# Patient Record
Sex: Female | Born: 1940 | Race: Black or African American | Hispanic: No | Smoking: Former smoker
Health system: Southern US, Community
[De-identification: ages and names within clinical notes are randomized; demographics above are authoritative.]

## PROBLEM LIST (undated history)

## (undated) DIAGNOSIS — E669 Obesity, unspecified: Secondary | ICD-10-CM

## (undated) DIAGNOSIS — C801 Malignant (primary) neoplasm, unspecified: Secondary | ICD-10-CM

## (undated) DIAGNOSIS — T4145XA Adverse effect of unspecified anesthetic, initial encounter: Secondary | ICD-10-CM

## (undated) DIAGNOSIS — Z801 Family history of malignant neoplasm of trachea, bronchus and lung: Secondary | ICD-10-CM

## (undated) DIAGNOSIS — R7303 Prediabetes: Secondary | ICD-10-CM

## (undated) DIAGNOSIS — Z8042 Family history of malignant neoplasm of prostate: Secondary | ICD-10-CM

## (undated) DIAGNOSIS — I10 Essential (primary) hypertension: Secondary | ICD-10-CM

## (undated) DIAGNOSIS — M1990 Unspecified osteoarthritis, unspecified site: Secondary | ICD-10-CM

## (undated) DIAGNOSIS — N189 Chronic kidney disease, unspecified: Secondary | ICD-10-CM

## (undated) DIAGNOSIS — E11319 Type 2 diabetes mellitus with unspecified diabetic retinopathy without macular edema: Secondary | ICD-10-CM

## (undated) DIAGNOSIS — E785 Hyperlipidemia, unspecified: Secondary | ICD-10-CM

## (undated) DIAGNOSIS — K5792 Diverticulitis of intestine, part unspecified, without perforation or abscess without bleeding: Secondary | ICD-10-CM

## (undated) DIAGNOSIS — Z803 Family history of malignant neoplasm of breast: Secondary | ICD-10-CM

## (undated) DIAGNOSIS — T8859XA Other complications of anesthesia, initial encounter: Secondary | ICD-10-CM

## (undated) DIAGNOSIS — T7840XA Allergy, unspecified, initial encounter: Secondary | ICD-10-CM

## (undated) DIAGNOSIS — G459 Transient cerebral ischemic attack, unspecified: Secondary | ICD-10-CM

## (undated) DIAGNOSIS — K635 Polyp of colon: Secondary | ICD-10-CM

## (undated) DIAGNOSIS — R39198 Other difficulties with micturition: Secondary | ICD-10-CM

## (undated) HISTORY — DX: Unspecified osteoarthritis, unspecified site: M19.90

## (undated) HISTORY — DX: Polyp of colon: K63.5

## (undated) HISTORY — DX: Diverticulitis of intestine, part unspecified, without perforation or abscess without bleeding: K57.92

## (undated) HISTORY — DX: Family history of malignant neoplasm of breast: Z80.3

## (undated) HISTORY — DX: Malignant (primary) neoplasm, unspecified: C80.1

## (undated) HISTORY — DX: Hyperlipidemia, unspecified: E78.5

## (undated) HISTORY — DX: Allergy, unspecified, initial encounter: T78.40XA

## (undated) HISTORY — DX: Obesity, unspecified: E66.9

## (undated) HISTORY — DX: Family history of malignant neoplasm of prostate: Z80.42

## (undated) HISTORY — PX: COLONOSCOPY: SHX5424

## (undated) HISTORY — DX: Essential (primary) hypertension: I10

## (undated) HISTORY — DX: Family history of malignant neoplasm of trachea, bronchus and lung: Z80.1

## (undated) HISTORY — PX: BREAST EXCISIONAL BIOPSY: SUR124

## (undated) HISTORY — PX: BREAST SURGERY: SHX581

---

## 1998-01-22 ENCOUNTER — Ambulatory Visit (HOSPITAL_COMMUNITY): Admission: RE | Admit: 1998-01-22 | Discharge: 1998-01-22 | Payer: Self-pay | Admitting: Family Medicine

## 1999-02-18 ENCOUNTER — Ambulatory Visit (HOSPITAL_COMMUNITY): Admission: RE | Admit: 1999-02-18 | Discharge: 1999-02-18 | Payer: Self-pay | Admitting: Family Medicine

## 2000-02-24 ENCOUNTER — Ambulatory Visit (HOSPITAL_COMMUNITY): Admission: RE | Admit: 2000-02-24 | Discharge: 2000-02-24 | Payer: Self-pay

## 2000-11-16 ENCOUNTER — Encounter: Payer: Self-pay | Admitting: Family Medicine

## 2000-11-16 ENCOUNTER — Ambulatory Visit (HOSPITAL_COMMUNITY): Admission: RE | Admit: 2000-11-16 | Discharge: 2000-11-16 | Payer: Self-pay | Admitting: Family Medicine

## 2001-02-28 ENCOUNTER — Ambulatory Visit (HOSPITAL_COMMUNITY): Admission: RE | Admit: 2001-02-28 | Discharge: 2001-02-28 | Payer: Self-pay | Admitting: Family Medicine

## 2002-03-23 ENCOUNTER — Encounter: Payer: Self-pay | Admitting: Family Medicine

## 2002-03-23 ENCOUNTER — Encounter: Admission: RE | Admit: 2002-03-23 | Discharge: 2002-03-23 | Payer: Self-pay | Admitting: Family Medicine

## 2003-11-05 ENCOUNTER — Encounter: Admission: RE | Admit: 2003-11-05 | Discharge: 2003-11-05 | Payer: Self-pay | Admitting: Family Medicine

## 2003-11-12 ENCOUNTER — Encounter: Admission: RE | Admit: 2003-11-12 | Discharge: 2003-11-12 | Payer: Self-pay | Admitting: Family Medicine

## 2004-10-30 ENCOUNTER — Ambulatory Visit (HOSPITAL_COMMUNITY): Admission: RE | Admit: 2004-10-30 | Discharge: 2004-10-30 | Payer: Self-pay | Admitting: Family Medicine

## 2004-10-30 ENCOUNTER — Ambulatory Visit: Payer: Self-pay | Admitting: *Deleted

## 2004-10-30 ENCOUNTER — Ambulatory Visit: Payer: Self-pay | Admitting: Family Medicine

## 2004-11-26 ENCOUNTER — Encounter: Admission: RE | Admit: 2004-11-26 | Discharge: 2004-11-26 | Payer: Self-pay | Admitting: Family Medicine

## 2004-12-22 ENCOUNTER — Ambulatory Visit: Payer: Self-pay | Admitting: Family Medicine

## 2005-01-28 ENCOUNTER — Ambulatory Visit: Payer: Self-pay | Admitting: Nurse Practitioner

## 2005-04-29 ENCOUNTER — Ambulatory Visit: Payer: Self-pay | Admitting: Family Medicine

## 2005-08-26 ENCOUNTER — Ambulatory Visit: Payer: Self-pay | Admitting: Nurse Practitioner

## 2005-12-08 ENCOUNTER — Encounter: Admission: RE | Admit: 2005-12-08 | Discharge: 2005-12-08 | Payer: Self-pay | Admitting: Family Medicine

## 2005-12-21 ENCOUNTER — Encounter (INDEPENDENT_AMBULATORY_CARE_PROVIDER_SITE_OTHER): Payer: Self-pay | Admitting: *Deleted

## 2005-12-21 ENCOUNTER — Encounter: Admission: RE | Admit: 2005-12-21 | Discharge: 2005-12-21 | Payer: Self-pay | Admitting: Family Medicine

## 2006-02-18 ENCOUNTER — Encounter: Admission: RE | Admit: 2006-02-18 | Discharge: 2006-02-18 | Payer: Self-pay | Admitting: General Surgery

## 2006-02-18 ENCOUNTER — Ambulatory Visit (HOSPITAL_BASED_OUTPATIENT_CLINIC_OR_DEPARTMENT_OTHER): Admission: RE | Admit: 2006-02-18 | Discharge: 2006-02-18 | Payer: Self-pay | Admitting: General Surgery

## 2006-02-18 ENCOUNTER — Encounter (INDEPENDENT_AMBULATORY_CARE_PROVIDER_SITE_OTHER): Payer: Self-pay | Admitting: *Deleted

## 2006-03-04 ENCOUNTER — Ambulatory Visit: Payer: Self-pay | Admitting: Family Medicine

## 2006-03-15 ENCOUNTER — Ambulatory Visit: Payer: Self-pay | Admitting: Family Medicine

## 2006-03-16 ENCOUNTER — Ambulatory Visit (HOSPITAL_COMMUNITY): Admission: RE | Admit: 2006-03-16 | Discharge: 2006-03-16 | Payer: Self-pay | Admitting: Family Medicine

## 2006-03-18 ENCOUNTER — Ambulatory Visit: Payer: Self-pay | Admitting: Family Medicine

## 2006-03-19 ENCOUNTER — Ambulatory Visit: Admission: RE | Admit: 2006-03-19 | Discharge: 2006-03-19 | Payer: Self-pay | Admitting: Family Medicine

## 2006-05-11 ENCOUNTER — Ambulatory Visit: Payer: Self-pay | Admitting: Family Medicine

## 2006-06-07 ENCOUNTER — Ambulatory Visit: Payer: Self-pay | Admitting: Family Medicine

## 2006-09-07 ENCOUNTER — Ambulatory Visit: Payer: Self-pay | Admitting: Family Medicine

## 2006-09-08 ENCOUNTER — Ambulatory Visit: Payer: Self-pay | Admitting: Family Medicine

## 2006-12-14 ENCOUNTER — Ambulatory Visit (HOSPITAL_COMMUNITY): Admission: RE | Admit: 2006-12-14 | Discharge: 2006-12-14 | Payer: Self-pay | Admitting: Obstetrics and Gynecology

## 2007-06-21 DIAGNOSIS — R32 Unspecified urinary incontinence: Secondary | ICD-10-CM

## 2007-06-21 DIAGNOSIS — J301 Allergic rhinitis due to pollen: Secondary | ICD-10-CM | POA: Insufficient documentation

## 2007-06-21 DIAGNOSIS — I10 Essential (primary) hypertension: Secondary | ICD-10-CM | POA: Insufficient documentation

## 2007-06-21 DIAGNOSIS — Z9189 Other specified personal risk factors, not elsewhere classified: Secondary | ICD-10-CM | POA: Insufficient documentation

## 2007-06-21 DIAGNOSIS — E7800 Pure hypercholesterolemia, unspecified: Secondary | ICD-10-CM | POA: Insufficient documentation

## 2007-06-21 DIAGNOSIS — N951 Menopausal and female climacteric states: Secondary | ICD-10-CM | POA: Insufficient documentation

## 2007-06-21 DIAGNOSIS — K5730 Diverticulosis of large intestine without perforation or abscess without bleeding: Secondary | ICD-10-CM | POA: Insufficient documentation

## 2007-06-21 DIAGNOSIS — Z8719 Personal history of other diseases of the digestive system: Secondary | ICD-10-CM | POA: Insufficient documentation

## 2007-07-20 ENCOUNTER — Encounter (INDEPENDENT_AMBULATORY_CARE_PROVIDER_SITE_OTHER): Payer: Self-pay | Admitting: *Deleted

## 2007-12-21 ENCOUNTER — Encounter: Admission: RE | Admit: 2007-12-21 | Discharge: 2007-12-21 | Payer: Self-pay | Admitting: Cardiology

## 2008-09-19 ENCOUNTER — Encounter: Admission: RE | Admit: 2008-09-19 | Discharge: 2008-09-19 | Payer: Self-pay | Admitting: Cardiology

## 2008-12-26 ENCOUNTER — Encounter: Admission: RE | Admit: 2008-12-26 | Discharge: 2008-12-26 | Payer: Self-pay | Admitting: Cardiology

## 2009-10-03 ENCOUNTER — Encounter: Admission: RE | Admit: 2009-10-03 | Discharge: 2009-10-03 | Payer: Self-pay | Admitting: Cardiology

## 2009-12-18 ENCOUNTER — Encounter: Admission: RE | Admit: 2009-12-18 | Discharge: 2009-12-18 | Payer: Self-pay | Admitting: Cardiology

## 2009-12-27 ENCOUNTER — Encounter: Admission: RE | Admit: 2009-12-27 | Discharge: 2009-12-27 | Payer: Self-pay | Admitting: Cardiology

## 2010-06-26 ENCOUNTER — Ambulatory Visit (HOSPITAL_BASED_OUTPATIENT_CLINIC_OR_DEPARTMENT_OTHER): Admission: RE | Admit: 2010-06-26 | Discharge: 2010-06-26 | Payer: Self-pay | Admitting: Urology

## 2010-11-22 ENCOUNTER — Encounter: Payer: Self-pay | Admitting: Family Medicine

## 2010-11-23 ENCOUNTER — Encounter: Payer: Self-pay | Admitting: Family Medicine

## 2010-12-30 ENCOUNTER — Other Ambulatory Visit: Payer: Self-pay | Admitting: Obstetrics and Gynecology

## 2010-12-30 DIAGNOSIS — Z1231 Encounter for screening mammogram for malignant neoplasm of breast: Secondary | ICD-10-CM

## 2011-01-13 ENCOUNTER — Ambulatory Visit
Admission: RE | Admit: 2011-01-13 | Discharge: 2011-01-13 | Disposition: A | Discharge status: Home / Self Care | Payer: Medicare Other | Source: Ambulatory Visit | Attending: Obstetrics and Gynecology | Admitting: Obstetrics and Gynecology

## 2011-01-13 DIAGNOSIS — Z1231 Encounter for screening mammogram for malignant neoplasm of breast: Secondary | ICD-10-CM

## 2011-01-16 LAB — BASIC METABOLIC PANEL
BUN: 20 mg/dL (ref 6–23)
CO2: 26 mEq/L (ref 19–32)
Calcium: 9.2 mg/dL (ref 8.4–10.5)
Chloride: 106 mEq/L (ref 96–112)
Creatinine, Ser: 1.04 mg/dL (ref 0.4–1.2)
GFR calc Af Amer: >60 mL/min (ref >60)
GFR calc non Af Amer: 53 mL/min — ABNORMAL LOW (ref >60)
Glucose, Bld: 130 mg/dL — ABNORMAL HIGH (ref 70–99)
Potassium: 4 mEq/L (ref 3.5–5.1)
Sodium: 138 mEq/L (ref 135–145)

## 2011-01-16 LAB — POCT I-STAT, CHEM 8
BUN: 20 mg/dL (ref 6–23)
Calcium, Ion: 1.24 mmol/L (ref 1.12–1.32)
Creatinine, Ser: 1 mg/dL (ref 0.4–1.2)
Glucose, Bld: 128 mg/dL — ABNORMAL HIGH (ref 70–99)
Hemoglobin: 13.3 g/dL (ref 12.0–15.0)
TCO2: 26 mmol/L (ref 0–100)

## 2011-01-16 LAB — CBC
MCV: 84.9 fL (ref 78.0–100.0)
Platelets: 181 10*3/uL (ref 150–400)
RBC: 4.34 MIL/uL (ref 3.87–5.11)
RDW: 13.2 % (ref 11.5–15.5)
WBC: 4.8 10*3/uL (ref 4.0–10.5)

## 2011-01-16 LAB — PROTIME-INR
INR: 1.05 (ref 0.00–1.49)
Prothrombin Time: 13.9 seconds (ref 11.6–15.2)

## 2011-01-16 LAB — APTT: aPTT: 37 seconds (ref 24–37)

## 2011-01-16 LAB — ABO/RH: ABO/RH(D): O POS

## 2011-01-16 LAB — TYPE AND SCREEN: ABO/RH(D): O POS

## 2011-03-20 NOTE — Op Note (Signed)
NAMEELFA, WOOTON                ACCOUNT NO.:  [id number]   MEDICAL RECORD NO.:  [id number]          PATIENT TYPE:  AMB   LOCATION:  DSC                          FACILITY:  [facility]   PHYSICIAN:  Leonie Man, M.D.   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   PROCEDURE:  Lumpectomy, right breast.   SURGEON:  Leonie Man, M.D.   ASSISTANT:  OR R.N.   ANESTHESIA:  General.   SPECIMENS TO PATHOLOGY:  Breast tissue.   ESTIMATED BLOOD LOSS:  Minimal.   COMPLICATIONS:  None.   DISPOSITION:  Patient to the PACU in excellent condition.   NOTE:  Ms. Carolyn Weber is a 70 year old female presenting with an abnormal  mammogram.  She underwent ultrasound-guided core biopsy which showed a  sclerosing papilloma.  The patient comes to the operating room now for  excision of this area after needle localization has been done to localize  the area of abnormality.   PROCEDURE:  The patient was positioned supinely.  After induction of general  anesthesia, the right breast was prepped and draped to be included in a  sterile operative field.  A circumareolar incision was carried down over the  superior border of the areola and deepened through skin and subcutaneous  tissue, raising a flap up to the region of the wire, which was located in  approximately the 12 o'clock axis.  Wide dissection was carried down around  the wire and this was carried down all the way to the chest wall and the  entire specimen removed and forwarded for pathologic evaluation.  Specimen  mammography showed the clip and wire to be well within the specimen.  Hemostasis was obtained with electrocautery and breast tissues  reapproximated with interrupted 2-0  Vicryl sutures, subcutaneous tissues  closed with 3-0 Vicryl sutures after sponge and instrument counts were  verified and skin was  closed with a 5-0 Monocryl suture and then reinforced with Steri-Strips.  Sterile dressings were applied, anesthetic reversed and patient removed from  the operating room to the recovery room in stable condition.  She tolerated  the procedure well.      Leonie Man, M.D.  Electronically Signed     PB/MEDQ  D:  02/18/2006  T:  02/19/2006  Job:  [id number]

## 2011-11-11 ENCOUNTER — Ambulatory Visit
Admission: RE | Admit: 2011-11-11 | Discharge: 2011-11-11 | Disposition: A | Discharge status: Home / Self Care | Payer: Medicare Other | Source: Ambulatory Visit | Attending: Cardiology | Admitting: Cardiology

## 2011-11-11 ENCOUNTER — Other Ambulatory Visit: Payer: Self-pay | Admitting: Cardiology

## 2011-11-11 DIAGNOSIS — R32 Unspecified urinary incontinence: Secondary | ICD-10-CM

## 2011-11-11 DIAGNOSIS — M549 Dorsalgia, unspecified: Secondary | ICD-10-CM

## 2011-12-21 ENCOUNTER — Encounter: Payer: Self-pay | Admitting: Nurse Practitioner

## 2011-12-28 ENCOUNTER — Encounter: Payer: Self-pay | Admitting: Nurse Practitioner

## 2011-12-28 ENCOUNTER — Ambulatory Visit (INDEPENDENT_AMBULATORY_CARE_PROVIDER_SITE_OTHER): Payer: Medicare Other | Admitting: Nurse Practitioner

## 2011-12-28 VITALS — BP 122/68 | HR 80 | Ht 67.0 in | Wt 208.6 lb

## 2011-12-28 DIAGNOSIS — R159 Full incontinence of feces: Secondary | ICD-10-CM

## 2011-12-28 NOTE — Progress Notes (Signed)
12/28/2011 Carolyn Weber [id number] 1940-12-07   HISTORY OF PRESENT ILLNESS: Carolyn Weber is a 71 year old female, new to this practice, here for evaluation of fecal incontinence. She had a colonoscopy with polypectomy approximately 3 years ago with [facility]. She was apparently told to have repeat colonoscopy in 5 years.  Patient wanted to talk with a female about her fecal incontinence, hence her reason for coming today. Patient has intentionally lost 25 pounds which she attributes to dietary changes. Patient gives a long history of fecal incontinence but since eating more fruits and vegetables her stools have become softer and the fecal incontinence is much worse.   Patient feels she adequately evacuates her bowels at time of defecation.    Patient followed by [facility] for overactive bladder. She is currently being treated with what sounds like tibial nerve stimulation .                                                                                                                                            Past Medical History  Diagnosis Date  . Chronic headaches   . Colon polyps   . Diverticulitis   . Hyperlipemia   . Hypertension   . Obesity   . UTI (lower urinary tract infection)    Past Surgical History  Procedure Date  . Calcium removal from breast   . Urinary sling     reports that she quit smoking about 33 years ago. She has never used smokeless tobacco. She reports that she does not drink alcohol or use illicit drugs. family history includes Breast cancer in her maternal aunt and mother; Colon polyps in her brother; Diabetes in her father and mother; Heart disease in her father and mother; and Prostate cancer in her father. No Known Allergies    Outpatient Encounter Prescriptions as of 12/28/2011  Medication Sig Dispense Refill  . aspirin 81 MG tablet Take 81 mg by mouth daily.      Marland Kitchen b complex vitamins tablet Take 1 tablet by mouth daily.      . calcium carbonate  (OS-CAL) 600 MG TABS Take 600 mg by mouth 2 (two) times daily with a meal.      . celecoxib (CELEBREX) 200 MG capsule Take 200 mg by mouth daily.      . cholecalciferol (VITAMIN D) 1000 UNITS tablet Take 1,000 Units by mouth daily.      . clobetasol cream (TEMOVATE) 0.05 % Apply 1 application topically daily as needed.      . DiphenhydrAMINE HCl (BENADRYL ALLERGY PO) Take 1 tablet by mouth. 1 tablet one to two times a day as needed      . fish oil-omega-3 fatty acids 1000 MG capsule Take 2 g by mouth daily.      Marland Kitchen gabapentin (NEURONTIN) 600 MG tablet Take 600 mg by mouth. Take one half a tablet Monday,  Wednesday, and Friday      . glucosamine-chondroitin 500-400 MG tablet Take 1 tablet by mouth 3 (three) times daily.      Marland Kitchen imipramine (TOFRANIL) 50 MG tablet Take 50 mg by mouth at bedtime.      Marland Kitchen loratadine (CLARITIN) 10 MG tablet Take 10 mg by mouth daily.      . Multiple Vitamin (MULTIVITAMIN) tablet Take 1 tablet by mouth daily.      Marland Kitchen olmesartan (BENICAR) 40 MG tablet Take 40 mg by mouth daily.      . simvastatin (ZOCOR) 10 MG tablet Take 20 mg by mouth at bedtime.        REVIEW OF SYSTEMS  : Positive for back pain, headaches, leg cramps. All other systems reviewed and negative except where noted in the History of Present Illness.   PHYSICAL EXAM: BP 122/68  Pulse 80  Ht 5\' 7"  (1.702 m)  Wt 208 lb 9.6 oz (94.62 kg)  BMI 32.67 kg/m2 General: Well developed black female in no acute distress Head: Normocephalic and atraumatic Eyes:  sclerae anicteric,conjunctive pink. Ears: Normal auditory acuity Neck: Supple, no masses.  Lungs: Clear throughout to auscultation Heart: Regular rate and rhythm Abdomen: Soft, non distended, nontender. No masses or hepatomegaly noted. Normal Bowel sounds Rectal: No external lesions seen. Sphincter tone adequate Musculoskeletal: Symmetrical with no gross deformities  Skin: No lesions on visible extremities Extremities: No edema or deformities  noted Neurological: Alert oriented, grossly nonfocal Cervical Nodes:  No significant cervical adenopathy Psychological:  Alert and cooperative. Normal mood and affect  ASSESSMENT AND PLAN; 33.  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 Spoke at length with the patient regarding causes and treatment of fecal incontinence. Her stools were not loose and sphincter tone seems adequate. Trial of fiber supplement may or may not be of benefit the patient would like to give it a try. We discussed anal manometry / biofeedback offered some tertiary care centers. We also discussed Delton Coombes which is an outpatient procedure (injection) for fecal incontinence. Patient will research Delton Coombes to see if it is of interest to her. Dr. Arlyce Dice in our office will begin to do the injections sometime in May. I do not know if the patient will qualify from an insurance standpoint but will ask Dr. Marzetta Board nurse to check into that. I gave patient NIDDK literature on fecal incontinence.  Patient is currently undergoing what sounds like tibial nerve stimulation for overactive bladder, there is some literature regarding its use in fecal incontinence as well.   2. History of colon polyps. Will request colonoscopy report from [facility] and make recommendations about timing of surveillance colonoscopy.

## 2011-12-28 NOTE — Patient Instructions (Signed)
Carolyn Weber, Dr. Marzetta Board nurse will call you about an appointment with Dr. Arlyce Dice for treatment.

## 2011-12-29 ENCOUNTER — Encounter: Payer: Self-pay | Admitting: Nurse Practitioner

## 2011-12-30 NOTE — Progress Notes (Signed)
Reviewed and agree with management. Earma Nicolaou D. Courney Garrod, M.D., FACG  

## 2012-01-05 ENCOUNTER — Other Ambulatory Visit: Payer: Self-pay | Admitting: Cardiology

## 2012-01-05 DIAGNOSIS — Z1231 Encounter for screening mammogram for malignant neoplasm of breast: Secondary | ICD-10-CM

## 2012-01-21 ENCOUNTER — Ambulatory Visit: Payer: Medicare Other

## 2012-03-16 ENCOUNTER — Ambulatory Visit
Admission: RE | Admit: 2012-03-16 | Discharge: 2012-03-16 | Disposition: A | Discharge status: Home / Self Care | Payer: Medicare Other | Source: Ambulatory Visit | Attending: Cardiology | Admitting: Cardiology

## 2012-03-16 DIAGNOSIS — Z1231 Encounter for screening mammogram for malignant neoplasm of breast: Secondary | ICD-10-CM

## 2012-11-23 ENCOUNTER — Other Ambulatory Visit: Payer: Self-pay | Admitting: Gastroenterology

## 2012-12-07 ENCOUNTER — Other Ambulatory Visit: Payer: Self-pay | Admitting: Cardiology

## 2012-12-07 ENCOUNTER — Ambulatory Visit
Admission: RE | Admit: 2012-12-07 | Discharge: 2012-12-07 | Disposition: A | Discharge status: Home / Self Care | Payer: Medicare Other | Source: Ambulatory Visit | Attending: Cardiology | Admitting: Cardiology

## 2012-12-07 DIAGNOSIS — T1490XA Injury, unspecified, initial encounter: Secondary | ICD-10-CM

## 2012-12-07 DIAGNOSIS — R609 Edema, unspecified: Secondary | ICD-10-CM

## 2013-03-07 ENCOUNTER — Other Ambulatory Visit: Payer: Self-pay

## 2013-03-07 DIAGNOSIS — Z1231 Encounter for screening mammogram for malignant neoplasm of breast: Secondary | ICD-10-CM

## 2013-04-12 ENCOUNTER — Ambulatory Visit: Payer: Medicare Other

## 2013-05-23 ENCOUNTER — Ambulatory Visit
Admission: RE | Admit: 2013-05-23 | Discharge: 2013-05-23 | Disposition: A | Discharge status: Home / Self Care | Payer: Medicare Other | Source: Ambulatory Visit

## 2013-05-23 DIAGNOSIS — Z1231 Encounter for screening mammogram for malignant neoplasm of breast: Secondary | ICD-10-CM

## 2014-05-28 ENCOUNTER — Other Ambulatory Visit: Payer: Self-pay

## 2014-05-28 DIAGNOSIS — Z1231 Encounter for screening mammogram for malignant neoplasm of breast: Secondary | ICD-10-CM

## 2014-05-30 ENCOUNTER — Encounter (INDEPENDENT_AMBULATORY_CARE_PROVIDER_SITE_OTHER): Payer: Self-pay

## 2014-05-30 ENCOUNTER — Ambulatory Visit
Admission: RE | Admit: 2014-05-30 | Discharge: 2014-05-30 | Disposition: A | Discharge status: Home / Self Care | Payer: Medicare Other | Source: Ambulatory Visit

## 2014-05-30 DIAGNOSIS — Z1231 Encounter for screening mammogram for malignant neoplasm of breast: Secondary | ICD-10-CM

## 2014-07-09 ENCOUNTER — Encounter (HOSPITAL_BASED_OUTPATIENT_CLINIC_OR_DEPARTMENT_OTHER): Payer: Self-pay | Admitting: Emergency Medicine

## 2014-07-09 ENCOUNTER — Ambulatory Visit (INDEPENDENT_AMBULATORY_CARE_PROVIDER_SITE_OTHER): Payer: Medicare Other | Admitting: Family Medicine

## 2014-07-09 ENCOUNTER — Emergency Department (HOSPITAL_BASED_OUTPATIENT_CLINIC_OR_DEPARTMENT_OTHER)
Admission: EM | Admit: 2014-07-09 | Discharge: 2014-07-10 | Disposition: A | Discharge status: Home / Self Care | Payer: Medicare Other | Attending: Emergency Medicine | Admitting: Emergency Medicine

## 2014-07-09 ENCOUNTER — Ambulatory Visit (INDEPENDENT_AMBULATORY_CARE_PROVIDER_SITE_OTHER): Payer: Medicare Other

## 2014-07-09 VITALS — BP 100/60 | HR 90 | Temp 98.7°F | Resp 18 | Ht 64.5 in | Wt 203.0 lb

## 2014-07-09 DIAGNOSIS — R7309 Other abnormal glucose: Secondary | ICD-10-CM

## 2014-07-09 DIAGNOSIS — R509 Fever, unspecified: Secondary | ICD-10-CM

## 2014-07-09 DIAGNOSIS — I10 Essential (primary) hypertension: Secondary | ICD-10-CM | POA: Diagnosis not present

## 2014-07-09 DIAGNOSIS — N289 Disorder of kidney and ureter, unspecified: Secondary | ICD-10-CM | POA: Diagnosis not present

## 2014-07-09 DIAGNOSIS — R000 Tachycardia, unspecified: Secondary | ICD-10-CM | POA: Diagnosis not present

## 2014-07-09 DIAGNOSIS — Z7982 Long term (current) use of aspirin: Secondary | ICD-10-CM | POA: Insufficient documentation

## 2014-07-09 DIAGNOSIS — R319 Hematuria, unspecified: Secondary | ICD-10-CM

## 2014-07-09 DIAGNOSIS — R799 Abnormal finding of blood chemistry, unspecified: Secondary | ICD-10-CM | POA: Diagnosis not present

## 2014-07-09 DIAGNOSIS — R05 Cough: Secondary | ICD-10-CM

## 2014-07-09 DIAGNOSIS — Z8719 Personal history of other diseases of the digestive system: Secondary | ICD-10-CM | POA: Diagnosis not present

## 2014-07-09 DIAGNOSIS — Z87891 Personal history of nicotine dependence: Secondary | ICD-10-CM | POA: Insufficient documentation

## 2014-07-09 DIAGNOSIS — R739 Hyperglycemia, unspecified: Secondary | ICD-10-CM

## 2014-07-09 DIAGNOSIS — Z79899 Other long term (current) drug therapy: Secondary | ICD-10-CM | POA: Diagnosis not present

## 2014-07-09 DIAGNOSIS — R059 Cough, unspecified: Secondary | ICD-10-CM

## 2014-07-09 DIAGNOSIS — R350 Frequency of micturition: Secondary | ICD-10-CM

## 2014-07-09 DIAGNOSIS — E785 Hyperlipidemia, unspecified: Secondary | ICD-10-CM | POA: Insufficient documentation

## 2014-07-09 DIAGNOSIS — M129 Arthropathy, unspecified: Secondary | ICD-10-CM | POA: Diagnosis not present

## 2014-07-09 DIAGNOSIS — E669 Obesity, unspecified: Secondary | ICD-10-CM | POA: Diagnosis not present

## 2014-07-09 DIAGNOSIS — R5383 Other fatigue: Secondary | ICD-10-CM

## 2014-07-09 DIAGNOSIS — R61 Generalized hyperhidrosis: Secondary | ICD-10-CM | POA: Diagnosis not present

## 2014-07-09 DIAGNOSIS — R0602 Shortness of breath: Secondary | ICD-10-CM | POA: Insufficient documentation

## 2014-07-09 DIAGNOSIS — Z791 Long term (current) use of non-steroidal anti-inflammatories (NSAID): Secondary | ICD-10-CM | POA: Insufficient documentation

## 2014-07-09 DIAGNOSIS — R630 Anorexia: Secondary | ICD-10-CM | POA: Insufficient documentation

## 2014-07-09 DIAGNOSIS — R5381 Other malaise: Secondary | ICD-10-CM

## 2014-07-09 DIAGNOSIS — R531 Weakness: Secondary | ICD-10-CM

## 2014-07-09 DIAGNOSIS — N390 Urinary tract infection, site not specified: Secondary | ICD-10-CM | POA: Insufficient documentation

## 2014-07-09 LAB — COMPREHENSIVE METABOLIC PANEL
ALBUMIN: 3.6 g/dL (ref 3.5–5.2)
ALT: 12 U/L (ref 0–35)
AST: 23 U/L (ref 0–37)
Alkaline Phosphatase: 63 U/L (ref 39–117)
BUN: 43 mg/dL — AB (ref 6–23)
CALCIUM: 9.7 mg/dL (ref 8.4–10.5)
CHLORIDE: 96 meq/L (ref 96–112)
CO2: 28 meq/L (ref 19–32)
CREATININE: 2.45 mg/dL — AB (ref 0.50–1.10)
GLUCOSE: 209 mg/dL — AB (ref 70–99)
POTASSIUM: 3.7 meq/L (ref 3.5–5.3)
Sodium: 135 mEq/L (ref 135–145)
Total Bilirubin: 0.7 mg/dL (ref 0.2–1.2)
Total Protein: 7 g/dL (ref 6.0–8.3)

## 2014-07-09 LAB — POCT CBC
GRANULOCYTE PERCENT: 80.9 % — AB (ref 37–80)
HEMATOCRIT: 36.1 % — AB (ref 37.7–47.9)
HEMOGLOBIN: 11.6 g/dL — AB (ref 12.2–16.2)
Lymph, poc: 1.4 (ref 0.6–3.4)
MCH: 27.8 pg (ref 27–31.2)
MCHC: 32.1 g/dL (ref 31.8–35.4)
MCV: 86.6 fL (ref 80–97)
MID (cbc): 0.8 (ref 0–0.9)
MPV: 7.2 fL (ref 0–99.8)
POC Granulocyte: 9.2 — AB (ref 2–6.9)
POC LYMPH PERCENT: 12 %L (ref 10–50)
POC MID %: 7.1 %M (ref 0–12)
Platelet Count, POC: 163 10*3/uL (ref 142–424)
RBC: 4.17 M/uL (ref 4.04–5.48)
RDW, POC: 13.4 %
WBC: 11.4 10*3/uL — AB (ref 4.6–10.2)

## 2014-07-09 LAB — POCT GLYCOSYLATED HEMOGLOBIN (HGB A1C): Hemoglobin A1C: 6.3

## 2014-07-09 LAB — GLUCOSE, POCT (MANUAL RESULT ENTRY): POC GLUCOSE: 205 mg/dL — AB (ref 70–99)

## 2014-07-09 MED ORDER — BENZONATATE 100 MG PO CAPS: 100.0000 mg | ORAL_CAPSULE | Freq: Three times a day (TID) | ORAL | Status: DC | PRN

## 2014-07-09 MED ORDER — IPRATROPIUM-ALBUTEROL 0.5-2.5 (3) MG/3ML IN SOLN
3.0000 mL | Freq: Once | RESPIRATORY_TRACT | Status: AC
Start: 2014-07-09 — End: 2014-07-09
  Administered 2014-07-09: 3 mL via RESPIRATORY_TRACT
  Filled 2014-07-09: qty 3

## 2014-07-09 MED ORDER — CEFDINIR 300 MG PO CAPS: 300.0000 mg | ORAL_CAPSULE | Freq: Two times a day (BID) | ORAL | Status: DC

## 2014-07-09 MED ORDER — SODIUM CHLORIDE 0.9 % IV SOLN
1000.0000 mL | INTRAVENOUS | Status: DC
  Administered 2014-07-10: 1000 mL via INTRAVENOUS

## 2014-07-09 MED ORDER — SODIUM CHLORIDE 0.9 % IV SOLN
1000.0000 mL | Freq: Once | INTRAVENOUS | Status: AC
  Administered 2014-07-09: 1000 mL via INTRAVENOUS

## 2014-07-09 NOTE — Patient Instructions (Signed)
Drink fluids but also be sure you are getting some salt- gatorade/ sports drinks, broth, pretzels, saltine crackers can all be helpful.  Use the antibiotic as directed and the tessalon perles as needed.   Do not take your BP medication for the next few days.  Give me a call with an update regarding your blood pressure later this week.  As you eat and get your strength back your BP will likely go up and we can go back on your medication

## 2014-07-09 NOTE — ED Notes (Signed)
Pt has several complaints,  Sent here from urgent care for creat of 2.45

## 2014-07-09 NOTE — ED Notes (Addendum)
PT presents to ED with multiple complaints: dizziness, weakness, allergies, bilateral rib soreness , urine retention, elevated creatine level today, "not feeling well".  PT was sent from UC for further eval .

## 2014-07-09 NOTE — ED Provider Notes (Signed)
CSN: [id number]     Arrival date & time 07/09/14  2233 History   First MD Initiated Contact with Patient 07/09/14 2310    This chart was scribed for [name], MD by Edison Simon, ED Scribe. This patient was seen in room MH09/MH09 and the patient's care was started at 11:24 PM.    No chief complaint on file.  The history is provided by the patient. No language interpreter was used.    HPI Comments: Carolyn Weber is a 73 y.o. female with a history of bladder issues who presents to the Emergency Department with multiple complaints. She states she went to the Urgent Care because she has not been feeling well for the past 5 days and was referred here for elevated creatine levels. She reports chills, productive cough, and fever measured at 101. She also reports associated sweats, dizziness, lightheadedness, fever, loss of appetite, body aches, and trouble urinating. She states she has been consuming a lot of smoothies recently because due to loss of appetite. She denies nausea, vomiting, or diarrhea. She reports shortness of breath after coughing spasms only.  Past Medical History  Diagnosis Date  . Diverticulitis   . Hyperlipemia   . Hypertension   . Obesity   . Allergy   . Arthritis    Past Surgical History  Procedure Laterality Date  . Calcium removal from breast    . Urinary sling     Family History  Problem Relation Age of Onset  . Breast cancer Mother   . Diabetes Mother   . Heart disease Mother   . Cancer Mother   . Hyperlipidemia Mother   . Hypertension Mother   . Stroke Mother   . Breast cancer Maternal Aunt     2  . Prostate cancer Father   . Diabetes Father   . Heart disease Father   . Hyperlipidemia Father   . Hypertension Father   . Colon polyps Brother     2   History  Substance Use Topics  . Smoking status: Former Smoker    Quit date: 11/02/1978  . Smokeless tobacco: Never Used  . Alcohol Use: No   OB History   Grav Para Term Preterm Abortions TAB SAB  Ect Mult Living                 Review of Systems  Constitutional: Positive for fever and chills.       Body aches  Respiratory: Positive for cough (productive). Negative for shortness of breath (only after coughing).   Gastrointestinal: Negative for nausea, vomiting and diarrhea.       Loss of appetite  Genitourinary: Positive for difficulty urinating.  Skin:       diaphoresis  Neurological: Positive for dizziness and light-headedness.  All other systems reviewed and are negative.     Allergies  Review of patient's allergies indicates no known allergies.  Home Medications   Prior to Admission medications   Medication Sig Start Date End Date Taking? Authorizing Provider  aspirin 81 MG tablet Take 81 mg by mouth daily.    Historical Provider, MD  b complex vitamins tablet Take 1 tablet by mouth daily.    Historical Provider, MD  benzonatate (TESSALON) 100 MG capsule Take 1 capsule (100 mg total) by mouth 3 (three) times daily as needed for cough. 07/09/14   Gay Filler Copland, MD  calcium carbonate (OS-CAL) 600 MG TABS Take 600 mg by mouth 2 (two) times daily with a meal.  Historical Provider, MD  cefdinir (OMNICEF) 300 MG capsule Take 1 capsule (300 mg total) by mouth 2 (two) times daily. 07/09/14   Gay Filler Copland, MD  celecoxib (CELEBREX) 200 MG capsule Take 200 mg by mouth daily.    Historical Provider, MD  cholecalciferol (VITAMIN D) 1000 UNITS tablet Take 1,000 Units by mouth daily.    Historical Provider, MD  clobetasol cream (TEMOVATE) 2.13 % Apply 1 application topically daily as needed.    Historical Provider, MD  DiphenhydrAMINE HCl (BENADRYL ALLERGY PO) Take 1 tablet by mouth. 1 tablet one to two times a day as needed    Historical Provider, MD  fish oil-omega-3 fatty acids 1000 MG capsule Take 2 g by mouth daily.    Historical Provider, MD  glucosamine-chondroitin 500-400 MG tablet Take 1 tablet by mouth 3 (three) times daily.    Historical Provider, MD  imipramine  (TOFRANIL) 50 MG tablet Take 50 mg by mouth at bedtime.    Historical Provider, MD  losartan-hydrochlorothiazide (HYZAAR) 100-25 MG per tablet Take 1 tablet by mouth daily.    Historical Provider, MD  Multiple Vitamin (MULTIVITAMIN) tablet Take 1 tablet by mouth daily.    Historical Provider, MD  simvastatin (ZOCOR) 10 MG tablet Take 20 mg by mouth at bedtime.    Historical Provider, MD   BP 127/61  Pulse 115  Temp(Src) 100.7 F (38.2 C) (Oral)  Resp 18  Ht 5\' 4"  (1.626 m)  Wt 202 lb (91.627 kg)  BMI 34.66 kg/m2  SpO2 100% Physical Exam  Nursing note and vitals reviewed. Constitutional: She is oriented to person, place, and time. She appears well-developed and well-nourished.  HENT:  Head: Normocephalic and atraumatic.  Eyes: Conjunctivae are normal. Pupils are equal, round, and reactive to light.  Neck: Normal range of motion. Neck supple. No JVD present.  Cardiovascular: Regular rhythm and normal heart sounds.   No murmur heard. Mildly tachycardic  Pulmonary/Chest: Effort normal. She has no wheezes. She has rales (faint rales to left base).  Prolonged exhalation phase  Abdominal: Soft. Bowel sounds are normal. She exhibits no distension and no mass. There is no tenderness.  Musculoskeletal: Normal range of motion. She exhibits no edema.  Lymphadenopathy:    She has no cervical adenopathy.  Neurological: She is alert and oriented to person, place, and time. She has normal reflexes. No cranial nerve deficit. Coordination normal.  Skin: Skin is warm and dry. No rash noted.  Psychiatric: She has a normal mood and affect. Her behavior is normal. Thought content normal.    ED Course  Procedures (including critical care time) Labs Review Results for orders placed during the hospital encounter of 07/09/14  URINALYSIS, ROUTINE W REFLEX MICROSCOPIC      Result Value Ref Range   Color, Urine YELLOW  YELLOW   APPearance TURBID (*) CLEAR   Specific Gravity, Urine 1.018  1.005 - 1.030    pH 5.5  5.0 - 8.0   Glucose, UA NEGATIVE  NEGATIVE mg/dL   Hgb urine dipstick LARGE (*) NEGATIVE   Bilirubin Urine NEGATIVE  NEGATIVE   Ketones, ur 15 (*) NEGATIVE mg/dL   Protein, ur 100 (*) NEGATIVE mg/dL   Urobilinogen, UA 1.0  0.0 - 1.0 mg/dL   Nitrite NEGATIVE  NEGATIVE   Leukocytes, UA LARGE (*) NEGATIVE  URINE MICROSCOPIC-ADD ON      Result Value Ref Range   Squamous Epithelial / LPF FEW (*) RARE   WBC, UA TOO NUMEROUS TO COUNT  <3  WBC/hpf   RBC / HPF 21-50  <3 RBC/hpf   Bacteria, UA MANY (*) RARE   Casts GRANULAR CAST (*) NEGATIVE   Urine-Other AMORPHOUS URATES/PHOSPHATES     DIAGNOSTIC STUDIES: Oxygen Saturation is 100% on room air, normal by my interpretation.    COORDINATION OF CARE:    MDM   Final diagnoses:  Urinary tract infection with hematuria, site unspecified  Renal insufficiency   Elevated creatinine of uncertain duration. Febrile, so which may represent pneumonia or urinary tract infection. I reviewed her records from her urgent care visit earlier and creatinine is elevated but last prior creatinine was 4 years ago. She did have a creatinine checked at her physician's office sometime in the last year but she does not know what that result was. She is given intravenous fluids and following this was still unable to produce a urine sample voluntarily. In and out catheterization was done to obtain a urine sample which is definite evidence of infection which is probably upper tract given the presence of granular casts. I reviewed her chest x-ray from the urgent care visit which was read as no acute disease but I feel that she probably does show small area of infiltrate at the left base. She was started on Ceftin ear which should give adequate coverage for urinary tract infection and pneumonia. She is taking several medications which can cause problems with her kidneys and she is advised to stop these medications-aspirin, celecoxib, and losartan-hydrochlorothiazide.  She is instructed to followup with her PCP in 2 days to recheck her creatinine and advised to return to urgent care or to the ED if unable to get in to see her PCP.  I personally performed the services described in this documentation, which was scribed in my presence. The recorded information has been reviewed and is accurate.     [name], MD [id number]

## 2014-07-09 NOTE — ED Notes (Signed)
Pt has several

## 2014-07-09 NOTE — Progress Notes (Addendum)
[facility] [address], [place] [address] [phone]  Date:  07/09/2014   Name:  Carolyn Weber   DOB:  Apr 24, 1941   MRN:  [id number]  PCP:  Patricia Nettle, MD    Chief Complaint: Fever, Cough and Fatigue   History of Present Illness:  Carolyn Weber is a 73 y.o. very pleasant female patient who presents with the following:  Here today with illness.  She has not felt well for about 5 days.  She has noted a cough and fever up to about 101.   The cough can be productive of clear to yellow mucus.   She has not noted a ST or earache, no stomach issues.   They have not noted wheezing.    She has not eaten much in about 5 days.  She is still taking fluids, trying to drink smoothies.  She is also on losartan/hctz for HTN- she started this a few months ago  Her PCP is Dr. Delfina Redwood  Her urologist is at [facility]  She has a "sensitive" bladder and frequency but this is not new.   She also has a history of pre- diabetes which is known to her.   She is here today with her daughter [name] who lives in [place].  She has other children who live here in [place] also. She still works as a Technical brewer  Patient Active Problem List   Diagnosis Date Noted  . HYPERCHOLESTEROLEMIA, PURE 06/21/2007  . HYPERTENSION, BENIGN ESSENTIAL 06/21/2007  . ALLERGIC RHINITIS, SEASONAL 06/21/2007  . DIVERTICULOSIS, COLON 06/21/2007  . POSTMENOPAUSAL STATUS 06/21/2007  . URINARY INCONTINENCE 06/21/2007  . DIVERTICULITIS, HX OF 06/21/2007  . MAMMOGRAM, ABNORMAL, RIGHT, HX OF 06/21/2007    Past Medical History  Diagnosis Date  . Diverticulitis   . Hyperlipemia   . Hypertension   . Obesity   . Allergy   . Arthritis     Past Surgical History  Procedure Laterality Date  . Calcium removal from breast    . Urinary sling      History  Substance Use Topics  . Smoking status: Former Smoker    Quit date: 11/02/1978  . Smokeless tobacco: Never Used  . Alcohol Use: No    Family History   Problem Relation Age of Onset  . Breast cancer Mother   . Diabetes Mother   . Heart disease Mother   . Cancer Mother   . Hyperlipidemia Mother   . Hypertension Mother   . Stroke Mother   . Breast cancer Maternal Aunt     2  . Prostate cancer Father   . Diabetes Father   . Heart disease Father   . Hyperlipidemia Father   . Hypertension Father   . Colon polyps Brother     2    No Known Allergies  Medication list has been reviewed and updated.  Current Outpatient Prescriptions on File Prior to Visit  Medication Sig Dispense Refill  . aspirin 81 MG tablet Take 81 mg by mouth daily.      Marland Kitchen b complex vitamins tablet Take 1 tablet by mouth daily.      . calcium carbonate (OS-CAL) 600 MG TABS Take 600 mg by mouth 2 (two) times daily with a meal.      . celecoxib (CELEBREX) 200 MG capsule Take 200 mg by mouth daily.      . cholecalciferol (VITAMIN D) 1000 UNITS tablet Take 1,000 Units by mouth daily.      Marland Kitchen  clobetasol cream (TEMOVATE) 6.94 % Apply 1 application topically daily as needed.      . DiphenhydrAMINE HCl (BENADRYL ALLERGY PO) Take 1 tablet by mouth. 1 tablet one to two times a day as needed      . fish oil-omega-3 fatty acids 1000 MG capsule Take 2 g by mouth daily.      Marland Kitchen glucosamine-chondroitin 500-400 MG tablet Take 1 tablet by mouth 3 (three) times daily.      Marland Kitchen imipramine (TOFRANIL) 50 MG tablet Take 50 mg by mouth at bedtime.      . Multiple Vitamin (MULTIVITAMIN) tablet Take 1 tablet by mouth daily.      . simvastatin (ZOCOR) 10 MG tablet Take 20 mg by mouth at bedtime.       No current facility-administered medications on file prior to visit.    Review of Systems:  As per HPI- otherwise negative.   Physical Examination: Filed Vitals:   07/09/14 1204  BP: 102/62  Pulse: 107  Temp: 98.7 F (37.1 C)  Resp: 18   Filed Vitals:   07/09/14 1204  Height: 5' 4.5" (1.638 m)  Weight: 203 lb (92.08 kg)   Body mass index is 34.32 kg/(m^2). Ideal Body Weight:  Weight in (lb) to have BMI = 25: 147.6  GEN: WDWN, NAD, Non-toxic, A & O x 3, overweight.   HEENT: Atraumatic, Normocephalic. Neck supple. No masses, No LAD.  Bilateral TM wnl, oropharynx normal.  PEERL,EOMI.   Ears and Nose: No external deformity. CV: RRR, No M/G/R. No JVD. No thrill. No extra heart sounds. PULM: CTA B, no wheezes, crackles, rhonchi. No retractions. No resp. distress. No accessory muscle use. ABD: S, NT, ND, +BS. No rebound. No HSM. EXTR: No c/c/e NEURO Normal gait.  PSYCH: Normally interactive. Conversant. Not depressed or anxious appearing.  Calm demeanor.   On the way to x-ray she became quite weak, pre- syncopal, sweaty and was tachycardic.  She was brought back to her room and given IVF, (1 liter NS), felt better, VS improved and we were able to proceed with CXR as below.    [facility] reading (PRIMARY) by  Dr. Lorelei Pont. CXR: retrocardiac infiltrate.     CHEST 2 VIEW  COMPARISON: None.  FINDINGS: Trachea is midline. Heart size normal. There may be atelectasis in the medial left lower lobe. Lungs are otherwise clear. No pleural fluid.  IMPRESSION: No acute findings.  Results for orders placed in visit on 07/09/14  POCT CBC      Result Value Ref Range   WBC 11.4 (*) 4.6 - 10.2 K/uL   Lymph, poc 1.4  0.6 - 3.4   POC LYMPH PERCENT 12.0  10 - 50 %L   MID (cbc) 0.8  0 - 0.9   POC MID % 7.1  0 - 12 %M   POC Granulocyte 9.2 (*) 2 - 6.9   Granulocyte percent 80.9 (*) 37 - 80 %G   RBC 4.17  4.04 - 5.48 M/uL   Hemoglobin 11.6 (*) 12.2 - 16.2 g/dL   HCT, POC 36.1 (*) 37.7 - 47.9 %   MCV 86.6  80 - 97 fL   MCH, POC 27.8  27 - 31.2 pg   MCHC 32.1  31.8 - 35.4 g/dL   RDW, POC 13.4     Platelet Count, POC 163  142 - 424 K/uL   MPV 7.2  0 - 99.8 fL  GLUCOSE, POCT (MANUAL RESULT ENTRY)      Result Value Ref Range  POC Glucose 205 (*) 70 - 99 mg/dl  POCT GLYCOSYLATED HEMOGLOBIN (HGB A1C)      Result Value Ref Range   Hemoglobin A1C 6.3       Assessment and  Plan: Cough - Plan: DG Chest 2 View, POCT CBC, Comprehensive metabolic panel, cefdinir (OMNICEF) 300 MG capsule, benzonatate (TESSALON) 100 MG capsule  Fever, unspecified - Plan: DG Chest 2 View  Urinary frequency - Plan: CANCELED: POCT UA - Microscopic Only, CANCELED: POCT urinalysis dipstick, CANCELED: Urine culture  Weakness - Plan: POCT glucose (manual entry)  Hyperglycemia - Plan: POCT glycosylated hemoglobin (Hb A1C)  73 year old lady here today with malaise, poor PO intake and dehydration. This may be due to pneumonia (her other main complaints are cough and fever) although her CXR is just slightly abnormal.  She is not able to give a urine sample.  Her hypotension improved after IVF and she is able to drink.  Will DC home with antibiotics and close follow-up.  She is aware of pre-diabetes.  She will hold her BP medication for a few days until she is feeling better.   Signed Lamar Blinks, MD  Received BMP at 8:30 pm and called- her renal function is poor, this is likely due to dehydration.  She is an [facility] pt so her labs are not on Epic- however they were able to locate some labs from 08/2013 that they found at home.  Her creat at that time was 1.23, and BUN 29; she was instructed to limit NSAIDs She also admitted that she still has not urinated- the last time she did urinate was around 11am.   Instructed pt to go to the ER as she likely needs more hydration- her daughter will take her to [facility].  Called ahead to alert the charge nurse.      Chemistry      Component Value Date/Time   NA 135 07/09/2014 1243   K 3.7 07/09/2014 1243   CL 96 07/09/2014 1243   CO2 28 07/09/2014 1243   BUN 43* 07/09/2014 1243   CREATININE 2.45* 07/09/2014 1243   CREATININE 1.0 06/26/2010 0719      Component Value Date/Time   CALCIUM 9.7 07/09/2014 1243   ALKPHOS 63 07/09/2014 1243   AST 23 07/09/2014 1243   ALT 12 07/09/2014 1243   BILITOT 0.7 07/09/2014 1243     Called to check on her 9/9. She is feeling  "a little better."  She is urinating again.  She is seeing Dr. Delfina Redwood tomorrow.  She will let me know if she needs anything in the meantime

## 2014-07-10 ENCOUNTER — Telehealth: Payer: Self-pay

## 2014-07-10 LAB — URINALYSIS, ROUTINE W REFLEX MICROSCOPIC
BILIRUBIN URINE: NEGATIVE
Glucose, UA: NEGATIVE mg/dL
Ketones, ur: 15 mg/dL — AB
NITRITE: NEGATIVE
PH: 5.5 (ref 5.0–8.0)
Protein, ur: 100 mg/dL — AB
Specific Gravity, Urine: 1.018 (ref 1.005–1.030)
UROBILINOGEN UA: 1 mg/dL (ref 0.0–1.0)

## 2014-07-10 LAB — URINE MICROSCOPIC-ADD ON

## 2014-07-10 NOTE — Telephone Encounter (Signed)
Pt states she was referred to [facility] and need [place] to fax over her records to them, have an appt on Thursday Please call pt at  [phone] and she doesn't know the fax number over there

## 2014-07-10 NOTE — ED Notes (Signed)
Pt unable to give urine  Per md in and out cath

## 2014-07-10 NOTE — Telephone Encounter (Signed)
Records faxed to Dr. Delfina Redwood thru [facility].

## 2014-07-10 NOTE — Discharge Instructions (Signed)
Stop taking your Aspirin, Celebrex, and Hyzaar. Do not resume taking any of them until you are directed to do so by Dr. Delfina Redwood. Do not take ibuprofen, naproxen, or any other NSAID(nonsteroidal anti-inflamatory drug). Continue taking the Omnicef that was prescribed earlier today. Follow up with your doctor in two days to recheck the kidney test (creatinine). If you are notable to get in to see your doctor, then you can come back here, or go to the urgent Care center where you went earlier today.  Urinary Tract Infection Urinary tract infections (UTIs) can develop anywhere along your urinary tract. Your urinary tract is your body's drainage system for removing wastes and extra water. Your urinary tract includes two kidneys, two ureters, a bladder, and a urethra. Your kidneys are a pair of bean-shaped organs. Each kidney is about the size of your fist. They are located below your ribs, one on each side of your spine. CAUSES Infections are caused by microbes, which are microscopic organisms, including fungi, viruses, and bacteria. These organisms are so small that they can only be seen through a microscope. Bacteria are the microbes that most commonly cause UTIs. SYMPTOMS  Symptoms of UTIs may vary by age and gender of the patient and by the location of the infection. Symptoms in young women typically include a frequent and intense urge to urinate and a painful, burning feeling in the bladder or urethra during urination. Older women and men are more likely to be tired, shaky, and weak and have muscle aches and abdominal pain. A fever may mean the infection is in your kidneys. Other symptoms of a kidney infection include pain in your back or sides below the ribs, nausea, and vomiting. DIAGNOSIS To diagnose a UTI, your caregiver will ask you about your symptoms. Your caregiver also will ask to provide a urine sample. The urine sample will be tested for bacteria and white blood cells. White blood cells are made by  your body to help fight infection. TREATMENT  Typically, UTIs can be treated with medication. Because most UTIs are caused by a bacterial infection, they usually can be treated with the use of antibiotics. The choice of antibiotic and length of treatment depend on your symptoms and the type of bacteria causing your infection. HOME CARE INSTRUCTIONS  If you were prescribed antibiotics, take them exactly as your caregiver instructs you. Finish the medication even if you feel better after you have only taken some of the medication.  Drink enough water and fluids to keep your urine clear or pale yellow.  Avoid caffeine, tea, and carbonated beverages. They tend to irritate your bladder.  Empty your bladder often. Avoid holding urine for long periods of time.  Empty your bladder before and after sexual intercourse.  After a bowel movement, women should cleanse from front to back. Use each tissue only once. SEEK MEDICAL CARE IF:   You have back pain.  You develop a fever.  Your symptoms do not begin to resolve within 3 days. SEEK IMMEDIATE MEDICAL CARE IF:   You have severe back pain or lower abdominal pain.  You develop chills.  You have nausea or vomiting.  You have continued burning or discomfort with urination. MAKE SURE YOU:   Understand these instructions.  Will watch your condition.  Will get help right away if you are not doing well or get worse. Document Released: 07/29/2005 Document Revised: 04/19/2012 Document Reviewed: 11/27/2011 [facility] Patient Information 2015 [place], [place]. This information is not intended to replace advice  given to you by your health care provider. Make sure you discuss any questions you have with your health care provider. ° °

## 2014-07-12 LAB — URINE CULTURE: Colony Count: 10000

## 2014-07-14 ENCOUNTER — Telehealth (HOSPITAL_COMMUNITY): Payer: Self-pay | Admitting: *Deleted

## 2014-07-14 NOTE — ED Notes (Signed)
(+)  urine culture, treated with Cefdinir, OK per J. Frens

## 2014-09-06 ENCOUNTER — Ambulatory Visit: Payer: Medicare Other | Admitting: Dietician

## 2014-09-18 ENCOUNTER — Ambulatory Visit: Payer: Medicare Other

## 2014-09-20 ENCOUNTER — Encounter: Payer: Medicare Other | Attending: Internal Medicine

## 2014-09-20 VITALS — Ht 64.0 in | Wt 183.7 lb

## 2014-09-20 DIAGNOSIS — Z713 Dietary counseling and surveillance: Secondary | ICD-10-CM | POA: Insufficient documentation

## 2014-09-20 DIAGNOSIS — E119 Type 2 diabetes mellitus without complications: Secondary | ICD-10-CM | POA: Diagnosis present

## 2014-09-20 NOTE — Progress Notes (Signed)

## 2014-09-25 ENCOUNTER — Ambulatory Visit: Payer: Medicare Other

## 2014-10-02 ENCOUNTER — Ambulatory Visit: Payer: Medicare Other

## 2014-10-04 ENCOUNTER — Ambulatory Visit: Payer: Medicare Other

## 2014-10-09 ENCOUNTER — Encounter: Payer: Medicare Other | Attending: Internal Medicine

## 2014-10-09 DIAGNOSIS — Z713 Dietary counseling and surveillance: Secondary | ICD-10-CM | POA: Insufficient documentation

## 2014-10-09 DIAGNOSIS — E119 Type 2 diabetes mellitus without complications: Secondary | ICD-10-CM | POA: Insufficient documentation

## 2014-10-11 ENCOUNTER — Ambulatory Visit: Payer: Medicare Other

## 2014-10-16 DIAGNOSIS — E119 Type 2 diabetes mellitus without complications: Secondary | ICD-10-CM | POA: Diagnosis present

## 2014-10-16 DIAGNOSIS — Z713 Dietary counseling and surveillance: Secondary | ICD-10-CM | POA: Diagnosis not present

## 2014-10-19 NOTE — Progress Notes (Signed)
Patient was seen on 10/16/14 for the third of a series of three diabetes self-management courses at the [facility]. The following learning objectives were met by the patient during this class:  . State the amount of activity recommended for healthy living . Describe activities suitable for individual needs . Identify ways to regularly incorporate activity into daily life . Identify barriers to activity and ways to over come these barriers  Identify diabetes medications being personally used and their primary action for lowering glucose and possible side effects . Describe role of stress on blood glucose and develop strategies to address psychosocial issues . Identify diabetes complications and ways to prevent them  Explain how to manage diabetes during illness . Evaluate success in meeting personal goal . Establish 2-3 goals that they will plan to diligently work on until they return for the  16-monthfollow-up visit  Goals:   I will count my carb choices at most meals and snacks  Your patient has identified these potential barriers to change:  None stated  Your patient has identified their diabetes self-care support plan as  NEads  Attend Core 4 in 4 months

## 2015-02-04 ENCOUNTER — Ambulatory Visit: Payer: Medicare Other

## 2015-06-17 ENCOUNTER — Other Ambulatory Visit: Payer: Self-pay

## 2015-06-17 DIAGNOSIS — Z1231 Encounter for screening mammogram for malignant neoplasm of breast: Secondary | ICD-10-CM

## 2015-07-25 ENCOUNTER — Ambulatory Visit
Admission: RE | Admit: 2015-07-25 | Discharge: 2015-07-25 | Disposition: A | Discharge status: Home / Self Care | Payer: Medicare Other | Source: Ambulatory Visit

## 2015-07-25 DIAGNOSIS — Z1231 Encounter for screening mammogram for malignant neoplasm of breast: Secondary | ICD-10-CM

## 2015-12-13 ENCOUNTER — Ambulatory Visit
Admission: RE | Admit: 2015-12-13 | Discharge: 2015-12-13 | Disposition: A | Discharge status: Home / Self Care | Payer: Medicare Other | Source: Ambulatory Visit | Attending: Internal Medicine | Admitting: Internal Medicine

## 2015-12-13 ENCOUNTER — Other Ambulatory Visit: Payer: Self-pay | Admitting: Internal Medicine

## 2015-12-13 DIAGNOSIS — R519 Headache, unspecified: Secondary | ICD-10-CM

## 2015-12-13 DIAGNOSIS — R51 Headache: Principal | ICD-10-CM

## 2015-12-13 DIAGNOSIS — S0990XA Unspecified injury of head, initial encounter: Secondary | ICD-10-CM

## 2015-12-16 ENCOUNTER — Ambulatory Visit
Admission: RE | Admit: 2015-12-16 | Discharge: 2015-12-16 | Disposition: A | Discharge status: Home / Self Care | Payer: Medicare Other | Source: Ambulatory Visit | Attending: Internal Medicine | Admitting: Internal Medicine

## 2015-12-16 DIAGNOSIS — S0990XA Unspecified injury of head, initial encounter: Secondary | ICD-10-CM

## 2015-12-16 DIAGNOSIS — R519 Headache, unspecified: Secondary | ICD-10-CM

## 2015-12-16 DIAGNOSIS — R51 Headache: Secondary | ICD-10-CM

## 2015-12-17 ENCOUNTER — Other Ambulatory Visit: Payer: Medicare Other

## 2015-12-19 ENCOUNTER — Other Ambulatory Visit: Payer: Medicare Other

## 2016-07-14 ENCOUNTER — Other Ambulatory Visit: Payer: Self-pay | Admitting: Internal Medicine

## 2016-07-14 DIAGNOSIS — Z1231 Encounter for screening mammogram for malignant neoplasm of breast: Secondary | ICD-10-CM

## 2016-07-17 LAB — GLUCOSE, POCT (MANUAL RESULT ENTRY): POC Glucose: 110 mg/dl — AB (ref 70–99)

## 2016-07-17 LAB — POCT GLYCOSYLATED HEMOGLOBIN (HGB A1C): Hemoglobin A1C: 6.5

## 2016-07-29 ENCOUNTER — Ambulatory Visit
Admission: RE | Admit: 2016-07-29 | Discharge: 2016-07-29 | Disposition: A | Discharge status: Home / Self Care | Payer: Medicare Other | Source: Ambulatory Visit | Attending: Internal Medicine | Admitting: Internal Medicine

## 2016-07-29 DIAGNOSIS — Z1231 Encounter for screening mammogram for malignant neoplasm of breast: Secondary | ICD-10-CM

## 2016-10-27 ENCOUNTER — Ambulatory Visit
Admission: RE | Admit: 2016-10-27 | Discharge: 2016-10-27 | Disposition: A | Discharge status: Home / Self Care | Payer: Medicare Other | Source: Ambulatory Visit | Attending: Internal Medicine | Admitting: Internal Medicine

## 2016-10-27 ENCOUNTER — Other Ambulatory Visit: Payer: Self-pay | Admitting: Internal Medicine

## 2016-10-27 DIAGNOSIS — M25561 Pain in right knee: Secondary | ICD-10-CM

## 2016-10-27 DIAGNOSIS — M25532 Pain in left wrist: Secondary | ICD-10-CM

## 2016-10-27 DIAGNOSIS — M25562 Pain in left knee: Secondary | ICD-10-CM

## 2016-10-27 DIAGNOSIS — M79642 Pain in left hand: Secondary | ICD-10-CM

## 2016-12-03 ENCOUNTER — Encounter (INDEPENDENT_AMBULATORY_CARE_PROVIDER_SITE_OTHER): Payer: Medicare Other | Admitting: Ophthalmology

## 2016-12-03 DIAGNOSIS — H34831 Tributary (branch) retinal vein occlusion, right eye, with macular edema: Secondary | ICD-10-CM | POA: Diagnosis not present

## 2016-12-03 DIAGNOSIS — E11319 Type 2 diabetes mellitus with unspecified diabetic retinopathy without macular edema: Secondary | ICD-10-CM | POA: Diagnosis not present

## 2016-12-03 DIAGNOSIS — I10 Essential (primary) hypertension: Secondary | ICD-10-CM | POA: Diagnosis not present

## 2016-12-03 DIAGNOSIS — H2513 Age-related nuclear cataract, bilateral: Secondary | ICD-10-CM

## 2016-12-03 DIAGNOSIS — E113291 Type 2 diabetes mellitus with mild nonproliferative diabetic retinopathy without macular edema, right eye: Secondary | ICD-10-CM

## 2016-12-03 DIAGNOSIS — H35033 Hypertensive retinopathy, bilateral: Secondary | ICD-10-CM

## 2016-12-03 DIAGNOSIS — H43813 Vitreous degeneration, bilateral: Secondary | ICD-10-CM

## 2017-06-14 ENCOUNTER — Ambulatory Visit (INDEPENDENT_AMBULATORY_CARE_PROVIDER_SITE_OTHER): Payer: Medicare Other | Admitting: Ophthalmology

## 2017-06-14 DIAGNOSIS — E11319 Type 2 diabetes mellitus with unspecified diabetic retinopathy without macular edema: Secondary | ICD-10-CM | POA: Diagnosis not present

## 2017-06-14 DIAGNOSIS — E113391 Type 2 diabetes mellitus with moderate nonproliferative diabetic retinopathy without macular edema, right eye: Secondary | ICD-10-CM

## 2017-06-14 DIAGNOSIS — H348321 Tributary (branch) retinal vein occlusion, left eye, with retinal neovascularization: Secondary | ICD-10-CM | POA: Diagnosis not present

## 2017-06-14 DIAGNOSIS — H2513 Age-related nuclear cataract, bilateral: Secondary | ICD-10-CM | POA: Diagnosis not present

## 2017-06-14 DIAGNOSIS — H43813 Vitreous degeneration, bilateral: Secondary | ICD-10-CM

## 2017-06-14 DIAGNOSIS — H35033 Hypertensive retinopathy, bilateral: Secondary | ICD-10-CM | POA: Diagnosis not present

## 2017-06-14 DIAGNOSIS — I10 Essential (primary) hypertension: Secondary | ICD-10-CM | POA: Diagnosis not present

## 2017-08-02 ENCOUNTER — Other Ambulatory Visit: Payer: Self-pay | Admitting: Internal Medicine

## 2017-08-02 DIAGNOSIS — Z1231 Encounter for screening mammogram for malignant neoplasm of breast: Secondary | ICD-10-CM

## 2017-08-12 ENCOUNTER — Ambulatory Visit: Payer: Medicare Other

## 2017-09-07 ENCOUNTER — Ambulatory Visit
Admission: RE | Admit: 2017-09-07 | Discharge: 2017-09-07 | Disposition: A | Discharge status: Home / Self Care | Payer: Medicare Other | Source: Ambulatory Visit | Attending: Internal Medicine | Admitting: Internal Medicine

## 2017-09-07 DIAGNOSIS — Z1231 Encounter for screening mammogram for malignant neoplasm of breast: Secondary | ICD-10-CM

## 2018-03-15 ENCOUNTER — Encounter (INDEPENDENT_AMBULATORY_CARE_PROVIDER_SITE_OTHER): Payer: Medicare Other | Admitting: Ophthalmology

## 2018-03-15 DIAGNOSIS — E113311 Type 2 diabetes mellitus with moderate nonproliferative diabetic retinopathy with macular edema, right eye: Secondary | ICD-10-CM

## 2018-03-15 DIAGNOSIS — H35033 Hypertensive retinopathy, bilateral: Secondary | ICD-10-CM | POA: Diagnosis not present

## 2018-03-15 DIAGNOSIS — I10 Essential (primary) hypertension: Secondary | ICD-10-CM

## 2018-03-15 DIAGNOSIS — H34831 Tributary (branch) retinal vein occlusion, right eye, with macular edema: Secondary | ICD-10-CM | POA: Diagnosis not present

## 2018-03-15 DIAGNOSIS — E113392 Type 2 diabetes mellitus with moderate nonproliferative diabetic retinopathy without macular edema, left eye: Secondary | ICD-10-CM

## 2018-03-15 DIAGNOSIS — H43813 Vitreous degeneration, bilateral: Secondary | ICD-10-CM | POA: Diagnosis not present

## 2018-03-15 DIAGNOSIS — E11311 Type 2 diabetes mellitus with unspecified diabetic retinopathy with macular edema: Secondary | ICD-10-CM | POA: Diagnosis not present

## 2018-03-16 ENCOUNTER — Encounter (INDEPENDENT_AMBULATORY_CARE_PROVIDER_SITE_OTHER): Payer: Medicare Other | Admitting: Ophthalmology

## 2018-03-21 ENCOUNTER — Encounter (INDEPENDENT_AMBULATORY_CARE_PROVIDER_SITE_OTHER): Payer: Medicare Other | Admitting: Ophthalmology

## 2018-03-21 DIAGNOSIS — H34831 Tributary (branch) retinal vein occlusion, right eye, with macular edema: Secondary | ICD-10-CM | POA: Diagnosis not present

## 2018-04-14 ENCOUNTER — Encounter (INDEPENDENT_AMBULATORY_CARE_PROVIDER_SITE_OTHER): Payer: Medicare Other | Admitting: Ophthalmology

## 2018-04-14 DIAGNOSIS — I10 Essential (primary) hypertension: Secondary | ICD-10-CM

## 2018-04-14 DIAGNOSIS — H34831 Tributary (branch) retinal vein occlusion, right eye, with macular edema: Secondary | ICD-10-CM

## 2018-04-14 DIAGNOSIS — H35033 Hypertensive retinopathy, bilateral: Secondary | ICD-10-CM | POA: Diagnosis not present

## 2018-04-14 DIAGNOSIS — E113393 Type 2 diabetes mellitus with moderate nonproliferative diabetic retinopathy without macular edema, bilateral: Secondary | ICD-10-CM | POA: Diagnosis not present

## 2018-04-14 DIAGNOSIS — H43813 Vitreous degeneration, bilateral: Secondary | ICD-10-CM

## 2018-04-14 DIAGNOSIS — E11319 Type 2 diabetes mellitus with unspecified diabetic retinopathy without macular edema: Secondary | ICD-10-CM | POA: Diagnosis not present

## 2018-05-12 ENCOUNTER — Encounter (INDEPENDENT_AMBULATORY_CARE_PROVIDER_SITE_OTHER): Payer: Medicare Other | Admitting: Ophthalmology

## 2018-05-12 DIAGNOSIS — H34831 Tributary (branch) retinal vein occlusion, right eye, with macular edema: Secondary | ICD-10-CM | POA: Diagnosis not present

## 2018-05-12 DIAGNOSIS — E113393 Type 2 diabetes mellitus with moderate nonproliferative diabetic retinopathy without macular edema, bilateral: Secondary | ICD-10-CM | POA: Diagnosis not present

## 2018-05-12 DIAGNOSIS — E11319 Type 2 diabetes mellitus with unspecified diabetic retinopathy without macular edema: Secondary | ICD-10-CM

## 2018-05-12 DIAGNOSIS — I10 Essential (primary) hypertension: Secondary | ICD-10-CM

## 2018-05-12 DIAGNOSIS — H43813 Vitreous degeneration, bilateral: Secondary | ICD-10-CM | POA: Diagnosis not present

## 2018-05-12 DIAGNOSIS — H35033 Hypertensive retinopathy, bilateral: Secondary | ICD-10-CM | POA: Diagnosis not present

## 2018-06-23 ENCOUNTER — Encounter (INDEPENDENT_AMBULATORY_CARE_PROVIDER_SITE_OTHER): Payer: Medicare Other | Admitting: Ophthalmology

## 2018-06-23 DIAGNOSIS — H35033 Hypertensive retinopathy, bilateral: Secondary | ICD-10-CM

## 2018-06-23 DIAGNOSIS — E11311 Type 2 diabetes mellitus with unspecified diabetic retinopathy with macular edema: Secondary | ICD-10-CM

## 2018-06-23 DIAGNOSIS — I10 Essential (primary) hypertension: Secondary | ICD-10-CM | POA: Diagnosis not present

## 2018-06-23 DIAGNOSIS — H34831 Tributary (branch) retinal vein occlusion, right eye, with macular edema: Secondary | ICD-10-CM | POA: Diagnosis not present

## 2018-06-23 DIAGNOSIS — E113292 Type 2 diabetes mellitus with mild nonproliferative diabetic retinopathy without macular edema, left eye: Secondary | ICD-10-CM

## 2018-06-23 DIAGNOSIS — H2513 Age-related nuclear cataract, bilateral: Secondary | ICD-10-CM

## 2018-06-23 DIAGNOSIS — E113311 Type 2 diabetes mellitus with moderate nonproliferative diabetic retinopathy with macular edema, right eye: Secondary | ICD-10-CM

## 2018-06-23 DIAGNOSIS — H43813 Vitreous degeneration, bilateral: Secondary | ICD-10-CM

## 2018-07-05 ENCOUNTER — Ambulatory Visit (INDEPENDENT_AMBULATORY_CARE_PROVIDER_SITE_OTHER): Payer: Medicare Other

## 2018-07-05 ENCOUNTER — Ambulatory Visit: Payer: Medicare Other | Admitting: Podiatry

## 2018-07-05 ENCOUNTER — Encounter: Payer: Self-pay | Admitting: Podiatry

## 2018-07-05 DIAGNOSIS — R0989 Other specified symptoms and signs involving the circulatory and respiratory systems: Secondary | ICD-10-CM | POA: Diagnosis not present

## 2018-07-05 DIAGNOSIS — M79675 Pain in left toe(s): Secondary | ICD-10-CM | POA: Diagnosis not present

## 2018-07-05 DIAGNOSIS — B351 Tinea unguium: Secondary | ICD-10-CM | POA: Diagnosis not present

## 2018-07-05 DIAGNOSIS — E1151 Type 2 diabetes mellitus with diabetic peripheral angiopathy without gangrene: Secondary | ICD-10-CM | POA: Diagnosis not present

## 2018-07-05 DIAGNOSIS — M2011 Hallux valgus (acquired), right foot: Secondary | ICD-10-CM

## 2018-07-05 DIAGNOSIS — M79674 Pain in right toe(s): Secondary | ICD-10-CM

## 2018-07-05 NOTE — Progress Notes (Signed)
Subjective:    Patient ID: Carolyn Weber, female    DOB: Sep 18, 1941, 77 y.o.   MRN: [id number]  HPI  77 year old female presents the office if he was concerned.  She states that she has a bunion on her right foot as well as some corn, callus issues.  She has been using separator for her finds it helpful the toe over.  She also states that her nails are thickened elongated she was having nails trimmed today.  She denies any history of ulceration.  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  She is diabetic and last A1c was 6.7.  She also states that she gets swelling to her legs intermittently with some occasional pain.  This is been a chronic issue.  She has no other concerns.  No recent injury.    Review of Systems  All other systems reviewed and are negative.  Past Medical History:  Diagnosis Date  . Allergy   . Arthritis   . Diabetes mellitus without complication ([place])   . Diverticulitis   . Hyperlipemia   . Hypertension   . Obesity     Past Surgical History:  Procedure Laterality Date  . BREAST SURGERY    . calcium removal from breast    . urinary sling       Current Outpatient Medications:  .  acetaminophen (TYLENOL) 650 MG CR tablet, Take 650 mg by mouth every 8 (eight) hours as needed for pain., Disp: , Rfl:  .  aspirin 81 MG tablet, Take 81 mg by mouth daily., Disp: , Rfl:  .  Cetirizine HCl (ZYRTEC PO), Take by mouth., Disp: , Rfl:  .  Docusate Calcium (STOOL SOFTENER PO), Take by mouth., Disp: , Rfl:  .  oxybutynin (OXYTROL) 3.9 MG/24HR, Place 1 patch onto the skin every 3 (three) days., Disp: , Rfl:  .  Polyethyl Glycol-Propyl Glycol (SYSTANE OP), Apply to eye., Disp: , Rfl:  .  amLODipine (NORVASC) 10 MG tablet, , Disp: , Rfl:  .  benzonatate (TESSALON) 100 MG capsule, Take 1 capsule (100 mg total) by mouth 3 (three) times daily as needed for cough., Disp: 40 capsule, Rfl: 0 .  calcium carbonate  (OS-CAL) 600 MG TABS, Take 600 mg by mouth 2 (two) times daily with a meal., Disp: , Rfl:  .  cholecalciferol (VITAMIN D) 1000 UNITS tablet, Take 1,000 Units by mouth daily., Disp: , Rfl:  .  clobetasol cream (TEMOVATE) 6.19 %, Apply 1 application topically daily as needed., Disp: , Rfl:  .  DiphenhydrAMINE HCl (BENADRYL ALLERGY PO), Take 1 tablet by mouth. 1 tablet one to two times a day as needed, Disp: , Rfl:  .  imipramine (TOFRANIL) 50 MG tablet, Take 50 mg by mouth at bedtime., Disp: , Rfl:  .  Multiple Vitamin (MULTIVITAMIN) tablet, Take 1 tablet by mouth daily., Disp: , Rfl:  .  PAZEO 0.7 % SOLN, , Disp: , Rfl:  .  simvastatin (ZOCOR) 10 MG tablet, Take 20 mg by mouth at bedtime., Disp: , Rfl:   No Known Allergies       Objective:   Physical Exam  General: AAO x3, NAD  Dermatological: Nails are hypertrophic, dystrophic, brittle, discolored, elongated 10. No surrounding redness or drainage. Tenderness nails 1-5 bilaterally.  Hyperkeratotic lesions to the toes bilaterally.  Upon debridement there is no underlying ulceration drainage or any signs  of infection present today.  No open lesions or pre-ulcerative lesions are identified today.  Vascular: DP pulses 2/4, PT pulse 1/4, chronic ankle swelling present.  Cap refill time is immediate to all the toes.  There is no pain with calf compression, erythema or warmth.  Neruologic: Grossly intact via light touch bilateral. Protective threshold with Semmes Wienstein monofilament intact to all pedal sites bilateral.   Musculoskeletal: Bunion deformities present.  Hammertoe contractures are present.  There is prominent metatarsal heads plantarly with atrophy of the fat pad.  Muscular strength 5/5 in all groups tested bilateral.  Gait: Unassisted, Nonantalgic.     Assessment & Plan:  77 year old female presents for bilateral foot pain, onychomycosis, hyperkeratotic lesions -Treatment options discussed including all alternatives, risks,  and complications -Etiology of symptoms were discussed -X-rays were obtained and reviewed with the patient.  Significant bunion deformity is present.  There is no evidence of acute fracture or stress fracture identified today. -Given her leg pain and swelling will order an ABI. -She is asking for nails be trimmed today.  Debride these were any complications or bleeding. -Hyperkeratotic lesion sharply debrided without any complications or bleeding. -Metatarsal offloading pads.  We discussed shoe modifications and inserts.  Trula Slade DPM

## 2018-07-06 ENCOUNTER — Telehealth: Payer: Self-pay | Admitting: *Deleted

## 2018-07-06 DIAGNOSIS — R0989 Other specified symptoms and signs involving the circulatory and respiratory systems: Secondary | ICD-10-CM

## 2018-07-06 NOTE — Telephone Encounter (Signed)
Orders faxed to [facility]. 

## 2018-07-06 NOTE — Telephone Encounter (Signed)
-----   Message from Trula Slade, DPM sent at 07/05/2018  5:08 PM EDT ----- Can you please order an ABI with TBI due to decreased PT pulse, swelling, burning to legs? Thanks.

## 2018-07-13 ENCOUNTER — Encounter (HOSPITAL_COMMUNITY): Payer: Medicare Other

## 2018-07-15 ENCOUNTER — Ambulatory Visit (HOSPITAL_COMMUNITY)
Admission: RE | Admit: 2018-07-15 | Discharge: 2018-07-15 | Disposition: A | Discharge status: Home / Self Care | Payer: Medicare Other | Source: Ambulatory Visit | Attending: Cardiovascular Disease | Admitting: Cardiovascular Disease

## 2018-07-15 DIAGNOSIS — R0989 Other specified symptoms and signs involving the circulatory and respiratory systems: Secondary | ICD-10-CM | POA: Diagnosis present

## 2018-07-20 ENCOUNTER — Telehealth: Payer: Self-pay | Admitting: *Deleted

## 2018-07-20 NOTE — Telephone Encounter (Signed)
Entered in error

## 2018-07-20 NOTE — Telephone Encounter (Signed)
-----   Message from Trula Slade, DPM sent at 07/19/2018  7:14 AM EDT ----- Val- please let her know that the circulation test is normal.

## 2018-07-20 NOTE — Telephone Encounter (Signed)
Pt called for results. I informed pt Dr. Jacqualyn Posey had stated her circulation test were normal. Pt asked if she needed to keep her appt with Dr. Jacqualyn Posey, and I told her if she was discussing surgery with Dr. Jacqualyn Posey or seeing him for another problem she should make an appt.

## 2018-07-20 NOTE — Telephone Encounter (Signed)
Left message on mobile and home phone for pt to call to discuss results.

## 2018-07-29 ENCOUNTER — Other Ambulatory Visit: Payer: Self-pay | Admitting: Internal Medicine

## 2018-07-29 DIAGNOSIS — Z1231 Encounter for screening mammogram for malignant neoplasm of breast: Secondary | ICD-10-CM

## 2018-08-15 ENCOUNTER — Encounter (INDEPENDENT_AMBULATORY_CARE_PROVIDER_SITE_OTHER): Payer: Medicare Other | Admitting: Ophthalmology

## 2018-08-17 ENCOUNTER — Encounter (INDEPENDENT_AMBULATORY_CARE_PROVIDER_SITE_OTHER): Payer: Medicare Other | Admitting: Ophthalmology

## 2018-08-17 DIAGNOSIS — I10 Essential (primary) hypertension: Secondary | ICD-10-CM | POA: Diagnosis not present

## 2018-08-17 DIAGNOSIS — H348312 Tributary (branch) retinal vein occlusion, right eye, stable: Secondary | ICD-10-CM

## 2018-08-17 DIAGNOSIS — H43813 Vitreous degeneration, bilateral: Secondary | ICD-10-CM

## 2018-08-17 DIAGNOSIS — H35033 Hypertensive retinopathy, bilateral: Secondary | ICD-10-CM

## 2018-08-17 DIAGNOSIS — E113391 Type 2 diabetes mellitus with moderate nonproliferative diabetic retinopathy without macular edema, right eye: Secondary | ICD-10-CM

## 2018-08-17 DIAGNOSIS — H2513 Age-related nuclear cataract, bilateral: Secondary | ICD-10-CM

## 2018-08-17 DIAGNOSIS — E113292 Type 2 diabetes mellitus with mild nonproliferative diabetic retinopathy without macular edema, left eye: Secondary | ICD-10-CM

## 2018-08-17 DIAGNOSIS — E11319 Type 2 diabetes mellitus with unspecified diabetic retinopathy without macular edema: Secondary | ICD-10-CM

## 2018-09-12 ENCOUNTER — Ambulatory Visit
Admission: RE | Admit: 2018-09-12 | Discharge: 2018-09-12 | Disposition: A | Discharge status: Home / Self Care | Payer: Medicare Other | Source: Ambulatory Visit | Attending: Internal Medicine | Admitting: Internal Medicine

## 2018-09-12 DIAGNOSIS — Z1231 Encounter for screening mammogram for malignant neoplasm of breast: Secondary | ICD-10-CM

## 2018-09-14 ENCOUNTER — Other Ambulatory Visit: Payer: Self-pay | Admitting: Internal Medicine

## 2018-09-14 DIAGNOSIS — R928 Other abnormal and inconclusive findings on diagnostic imaging of breast: Secondary | ICD-10-CM

## 2018-09-19 ENCOUNTER — Other Ambulatory Visit: Payer: Self-pay | Admitting: Internal Medicine

## 2018-09-19 ENCOUNTER — Ambulatory Visit
Admission: RE | Admit: 2018-09-19 | Discharge: 2018-09-19 | Disposition: A | Discharge status: Home / Self Care | Payer: Medicare Other | Source: Ambulatory Visit | Attending: Internal Medicine | Admitting: Internal Medicine

## 2018-09-19 DIAGNOSIS — N6310 Unspecified lump in the right breast, unspecified quadrant: Secondary | ICD-10-CM

## 2018-09-19 DIAGNOSIS — R928 Other abnormal and inconclusive findings on diagnostic imaging of breast: Secondary | ICD-10-CM

## 2018-09-21 ENCOUNTER — Ambulatory Visit
Admission: RE | Admit: 2018-09-21 | Discharge: 2018-09-21 | Disposition: A | Discharge status: Home / Self Care | Payer: Medicare Other | Source: Ambulatory Visit | Attending: Internal Medicine | Admitting: Internal Medicine

## 2018-09-21 DIAGNOSIS — N6310 Unspecified lump in the right breast, unspecified quadrant: Secondary | ICD-10-CM

## 2018-09-21 HISTORY — PX: OTHER SURGICAL HISTORY: SHX169

## 2018-10-02 DIAGNOSIS — C801 Malignant (primary) neoplasm, unspecified: Secondary | ICD-10-CM

## 2018-10-02 HISTORY — DX: Malignant (primary) neoplasm, unspecified: C80.1

## 2018-10-04 ENCOUNTER — Other Ambulatory Visit: Payer: Self-pay | Admitting: General Surgery

## 2018-10-04 DIAGNOSIS — Z170 Estrogen receptor positive status [ER+]: Principal | ICD-10-CM

## 2018-10-04 DIAGNOSIS — C50211 Malignant neoplasm of upper-inner quadrant of right female breast: Secondary | ICD-10-CM

## 2018-10-05 ENCOUNTER — Other Ambulatory Visit: Payer: Self-pay | Admitting: Geriatric Medicine

## 2018-10-05 ENCOUNTER — Other Ambulatory Visit: Payer: Self-pay | Admitting: General Surgery

## 2018-10-05 DIAGNOSIS — C50211 Malignant neoplasm of upper-inner quadrant of right female breast: Secondary | ICD-10-CM

## 2018-10-05 DIAGNOSIS — R269 Unspecified abnormalities of gait and mobility: Secondary | ICD-10-CM

## 2018-10-05 DIAGNOSIS — Z170 Estrogen receptor positive status [ER+]: Principal | ICD-10-CM

## 2018-10-07 ENCOUNTER — Telehealth: Payer: Self-pay | Admitting: Hematology and Oncology

## 2018-10-07 NOTE — Telephone Encounter (Signed)
Received urgent referral for medonc from Dr. 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 Pt has been cld and scheduled to see Dr. [name] on 12/9 at 215pm. A genetics appt scheduled on 12/19 at 2pm. Pt has agreed to both appts.

## 2018-10-10 ENCOUNTER — Inpatient Hospital Stay: Payer: Medicare Other | Attending: Hematology and Oncology | Admitting: Hematology and Oncology

## 2018-10-10 ENCOUNTER — Telehealth: Payer: Self-pay | Admitting: Hematology and Oncology

## 2018-10-10 DIAGNOSIS — Z170 Estrogen receptor positive status [ER+]: Secondary | ICD-10-CM | POA: Diagnosis not present

## 2018-10-10 DIAGNOSIS — C50211 Malignant neoplasm of upper-inner quadrant of right female breast: Secondary | ICD-10-CM | POA: Diagnosis not present

## 2018-10-10 NOTE — Progress Notes (Signed)
[facility] NOTE  Patient Care Team: Seward Carol, MD as PCP - General (Internal Medicine)  CHIEF COMPLAINTS/PURPOSE OF CONSULTATION:  Newly diagnosed breast cancer  HISTORY OF PRESENTING ILLNESS:  Carolyn Weber 77 y.o. female is here because of recent diagnosis of right breast cancer.  Patient had a routine screening mammogram that detected a right breast mass at 2:30 position.  It measured 0.9 cm.  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  She is here accompanied by her daughter.  She is still working full-time as a Programmer, applications.  Recently she has had some problems with instability and she is going to undergo a brain MRI because of clinical suspicion of stroke.  She however today feels remarkably better.  Energy levels have recovered.  I reviewed her records extensively and collaborated the history with the patient.  SUMMARY OF ONCOLOGIC HISTORY:   Malignant neoplasm of upper-inner quadrant of right breast in female, estrogen receptor positive ([place])   09/21/2018 Initial Diagnosis    Screening mammogram detected right breast mass 2:30 position.  3 cm from nipple 0.9 x 0.6 x 0.8 cm, ultrasound axilla negative biopsy revealed grade 1-2 IDC ER 95%, PR 90%, HER-2 1+ negative, Ki-67 5%, T1b N0 stage Ia clinical stage    10/10/2018 Cancer Staging    Staging form: Breast, AJCC 8th Edition - Clinical: Stage IA (cT1b, cN0, cM0, G2, ER+, PR+, HER2-) - Signed by Nicholas Lose, MD on 10/10/2018      MEDICAL HISTORY:  Past Medical History:  Diagnosis Date  . Allergy   . Arthritis   . Diabetes mellitus without complication ([place])   . Diverticulitis   . Hyperlipemia   . Hypertension   . Obesity     SURGICAL HISTORY: Past Surgical History:  Procedure Laterality Date  . BREAST EXCISIONAL BIOPSY Right    benign - over 40 years ago  . BREAST SURGERY    . calcium removal from breast    . urinary sling       SOCIAL HISTORY: Social History   Socioeconomic History  . Marital status: Divorced    Spouse name: Not on file  . Number of children: 2  . Years of education: Not on file  . Highest education level: Not on file  Occupational History  . Occupation: semi-retired  Social Needs  . Financial resource strain: Not on file  . Food insecurity:    Worry: Not on file    Inability: Not on file  . Transportation needs:    Medical: Not on file    Non-medical: Not on file  Tobacco Use  . Smoking status: Former Smoker    Last attempt to quit: 11/02/1978    Years since quitting: 39.9  . Smokeless tobacco: Never Used  Substance and Sexual Activity  . Alcohol use: No  . Drug use: No  . Sexual activity: Not on file  Lifestyle  . Physical activity:    Days per week: Not on file    Minutes per session: Not on file  . Stress: Not on file  Relationships  . Social connections:    Talks on phone: Not on file    Gets together: Not on file    Attends religious service: Not on file    Active member of club or organization: Not on file    Attends meetings of clubs or organizations: Not on file    Relationship status:  Not on file  . Intimate partner violence:    Fear of current or ex partner: Not on file    Emotionally abused: Not on file    Physically abused: Not on file    Forced sexual activity: Not on file  Other Topics Concern  . Not on file  Social History Narrative  . Not on file    FAMILY HISTORY: Family History  Problem Relation Age of Onset  . Breast cancer Mother   . Diabetes Mother   . Heart disease Mother   . Cancer Mother   . Hyperlipidemia Mother   . Hypertension Mother   . Stroke Mother   . Prostate cancer Father   . Diabetes Father   . Heart disease Father   . Hyperlipidemia Father   . Hypertension Father   . Colon polyps Brother        2  . Breast cancer Maternal Aunt        2    ALLERGIES:  has No Known Allergies.  MEDICATIONS:  Current Outpatient  Medications  Medication Sig Dispense Refill  . acetaminophen (TYLENOL) 650 MG CR tablet Take 650 mg by mouth every 8 (eight) hours as needed for pain.    Marland Kitchen amLODipine (NORVASC) 10 MG tablet     . aspirin 81 MG tablet Take 81 mg by mouth daily.    . benzonatate (TESSALON) 100 MG capsule Take 1 capsule (100 mg total) by mouth 3 (three) times daily as needed for cough. 40 capsule 0  . calcium carbonate (OS-CAL) 600 MG TABS Take 600 mg by mouth 2 (two) times daily with a meal.    . Cetirizine HCl (ZYRTEC PO) Take by mouth.    . cholecalciferol (VITAMIN D) 1000 UNITS tablet Take 1,000 Units by mouth daily.    . clobetasol cream (TEMOVATE) 6.29 % Apply 1 application topically daily as needed.    . DiphenhydrAMINE HCl (BENADRYL ALLERGY PO) Take 1 tablet by mouth. 1 tablet one to two times a day as needed    . Docusate Calcium (STOOL SOFTENER PO) Take by mouth.    Marland Kitchen imipramine (TOFRANIL) 50 MG tablet Take 50 mg by mouth at bedtime.    . Multiple Vitamin (MULTIVITAMIN) tablet Take 1 tablet by mouth daily.    Marland Kitchen oxybutynin (OXYTROL) 3.9 MG/24HR Place 1 patch onto the skin every 3 (three) days.    Marland Kitchen PAZEO 0.7 % SOLN     . Polyethyl Glycol-Propyl Glycol (SYSTANE OP) Apply to eye.    . simvastatin (ZOCOR) 10 MG tablet Take 20 mg by mouth at bedtime.     No current facility-administered medications for this visit.     REVIEW OF SYSTEMS:   Constitutional: Denies fevers, chills or abnormal night sweats Eyes: Denies blurriness of vision, double vision or watery eyes Ears, nose, mouth, throat, and face: Denies mucositis or sore throat Respiratory: Denies cough, dyspnea or wheezes Cardiovascular: Denies palpitation, chest discomfort or lower extremity swelling Gastrointestinal:  Denies nausea, heartburn or change in bowel habits Skin: Denies abnormal skin rashes Lymphatics: Denies new lymphadenopathy or easy bruising Neurological:Denies numbness, tingling or new weaknesses Behavioral/Psych: Mood is  stable, no new changes  Breast:  Denies any palpable lumps or discharge All other systems were reviewed with the patient and are negative.  PHYSICAL EXAMINATION: ECOG PERFORMANCE STATUS: 0 - Asymptomatic  Vitals:   10/10/18 1501  BP: (!) 148/75  Pulse: 87  Resp: 17  Temp: 98.2 F (36.8 C)  SpO2: 98%  Filed Weights   10/10/18 1501  Weight: 204 lb 11.2 oz (92.9 kg)    GENERAL:alert, no distress and comfortable SKIN: skin color, texture, turgor are normal, no rashes or significant lesions EYES: normal, conjunctiva are pink and non-injected, sclera clear OROPHARYNX:no exudate, no erythema and lips, buccal mucosa, and tongue normal  NECK: supple, thyroid normal size, non-tender, without nodularity LYMPH:  no palpable lymphadenopathy in the cervical, axillary or inguinal LUNGS: clear to auscultation and percussion with normal breathing effort HEART: regular rate & rhythm and no murmurs and no lower extremity edema ABDOMEN:abdomen soft, non-tender and normal bowel sounds Musculoskeletal:no cyanosis of digits and no clubbing  PSYCH: alert & oriented x 3 with fluent speech NEURO: no focal motor/sensory deficits BREAST: No palpable nodules in breast. No palpable axillary or supraclavicular lymphadenopathy (exam performed in the presence of a chaperone)   LABORATORY DATA:  I have reviewed the data as listed Lab Results  Component Value Date   WBC 11.4 (A) 07/09/2014   HGB 11.6 (A) 07/09/2014   HCT 36.1 (A) 07/09/2014   MCV 86.6 07/09/2014   PLT 181 06/26/2010   Lab Results  Component Value Date   NA 135 07/09/2014   K 3.7 07/09/2014   CL 96 07/09/2014   CO2 28 07/09/2014    RADIOGRAPHIC STUDIES: I have personally reviewed the radiological reports and agreed with the findings in the report.  ASSESSMENT AND PLAN:  Malignant neoplasm of upper-inner quadrant of right breast in female, estrogen receptor positive ([place]) 09/21/2018:Screening mammogram detected right breast  mass 2:30 position.  3 cm from nipple 0.9 x 0.6 x 0.8 cm, ultrasound axilla negative biopsy revealed grade 1-2 IDC ER 95%, PR 90%, HER-2 1+ negative, Ki-67 5%, T1b N0 stage Ia clinical stage  Pathology and radiology counseling:Discussed with the patient, the details of pathology including the type of breast cancer,the clinical staging, the significance of ER, PR and HER-2/neu receptors and the implications for treatment. After reviewing the pathology in detail, we proceeded to discuss the different treatment options between surgery, radiation, chemotherapy, antiestrogen therapies.  Recommendations: 1. Breast conserving surgery followed by 2. Adjuvant radiation therapy: Given her age and favorable prognostic criteria, she may not need radiation.  However this decision will be made in the tumor board. 3. Adjuvant antiestrogen therapy: We discussed risks and benefits of letrozole therapy. We will need a copy of bone density test which she had with Dr. Delfina Redwood.  Return to clinic after surgery to discuss final pathology report    All questions were answered. The patient knows to call the clinic with any problems, questions or concerns.    Harriette Ohara, MD 10/10/18

## 2018-10-10 NOTE — Telephone Encounter (Signed)
Gave avs and calendar ° °

## 2018-10-10 NOTE — Assessment & Plan Note (Signed)
09/21/2018:Screening mammogram detected right breast mass 2:30 position.  3 cm from nipple 0.9 x 0.6 x 0.8 cm, ultrasound axilla negative biopsy revealed grade 1-2 IDC ER 95%, PR 90%, HER-2 1+ negative, Ki-67 5%, T1b N0 stage Ia clinical stage  Pathology and radiology counseling:Discussed with the patient, the details of pathology including the type of breast cancer,the clinical staging, the significance of ER, PR and HER-2/neu receptors and the implications for treatment. After reviewing the pathology in detail, we proceeded to discuss the different treatment options between surgery, radiation, chemotherapy, antiestrogen therapies.  Recommendations: 1. Breast conserving surgery followed by 2. Oncotype DX testing to determine if chemotherapy would be of any benefit followed by 3. Adjuvant radiation therapy followed by 4. Adjuvant antiestrogen therapy  Oncotype counseling: I discussed Oncotype DX test. I explained to the patient that this is a 21 gene panel to evaluate patient tumors DNA to calculate recurrence score. This would help determine whether patient has high risk or intermediate risk or low risk breast cancer. She understands that if her tumor was found to be high risk, she would benefit from systemic chemotherapy. If low risk, no need of chemotherapy. If she was found to be intermediate risk, we would need to evaluate the score as well as other risk factors and determine if an abbreviated chemotherapy may be of benefit.  Return to clinic after surgery to discuss final pathology report and then determine if Oncotype DX testing will need to be sent.

## 2018-10-11 ENCOUNTER — Encounter: Payer: Self-pay | Admitting: *Deleted

## 2018-10-12 ENCOUNTER — Ambulatory Visit
Admission: RE | Admit: 2018-10-12 | Discharge: 2018-10-12 | Disposition: A | Discharge status: Home / Self Care | Payer: Medicare Other | Source: Ambulatory Visit | Attending: Geriatric Medicine | Admitting: Geriatric Medicine

## 2018-10-12 DIAGNOSIS — R269 Unspecified abnormalities of gait and mobility: Secondary | ICD-10-CM

## 2018-10-12 MED ORDER — GADOBENATE DIMEGLUMINE 529 MG/ML IV SOLN
19.0000 mL | Freq: Once | INTRAVENOUS | Status: AC | PRN
  Administered 2018-10-12: 19 mL via INTRAVENOUS

## 2018-10-14 ENCOUNTER — Encounter (INDEPENDENT_AMBULATORY_CARE_PROVIDER_SITE_OTHER): Payer: Medicare Other | Admitting: Ophthalmology

## 2018-10-14 DIAGNOSIS — H43813 Vitreous degeneration, bilateral: Secondary | ICD-10-CM

## 2018-10-14 DIAGNOSIS — H348312 Tributary (branch) retinal vein occlusion, right eye, stable: Secondary | ICD-10-CM | POA: Diagnosis not present

## 2018-10-14 DIAGNOSIS — E113393 Type 2 diabetes mellitus with moderate nonproliferative diabetic retinopathy without macular edema, bilateral: Secondary | ICD-10-CM

## 2018-10-14 DIAGNOSIS — E11319 Type 2 diabetes mellitus with unspecified diabetic retinopathy without macular edema: Secondary | ICD-10-CM | POA: Diagnosis not present

## 2018-10-14 DIAGNOSIS — H35033 Hypertensive retinopathy, bilateral: Secondary | ICD-10-CM | POA: Diagnosis not present

## 2018-10-14 DIAGNOSIS — I10 Essential (primary) hypertension: Secondary | ICD-10-CM

## 2018-10-14 DIAGNOSIS — H2513 Age-related nuclear cataract, bilateral: Secondary | ICD-10-CM

## 2018-10-14 NOTE — Progress Notes (Signed)
Location of Breast Cancer: Right Breast  Histology per Pathology Report:  09/21/18 Diagnosis Breast, right, needle core biopsy, UIQ, 2:30 o'clock 3 cm from nipple - INVASIVE DUCTAL CARCINOMA, GRADE 1-2. SEE NOTE.  Receptor Status: ER(95%), PR (90%), Her2-neu (NEG), Ki-(5%)  Did patient present with symptoms or was this found on screening mammography?: It was discovered on a screening mammogram.   Past/Anticipated interventions by surgeon, if any:  Dr. Dalbert Batman plans Right breast lumpectomy for 11/11/2018  Past/Anticipated interventions by medical oncology, if any:  Dr. [name] 10/10/18 Recommendations: 1. Breast conserving surgery followed by 2. Adjuvant radiation therapy: Given her age and favorable prognostic criteria, she may not need radiation.  However this decision will be made in the tumor board. 3. Adjuvant antiestrogen therapy: We discussed risks and benefits of letrozole therapy. We will need a copy of bone density test which she had with Dr. Delfina Redwood. --Return to clinic after surgery to discuss final pathology report    Lymphedema issues, if any:  N/A  Pain issues, if any:  She denies.   SAFETY ISSUES:  Prior radiation? No  Pacemaker/ICD? No  Possible current pregnancy? No  Is the patient on methotrexate? No  Current Complaints / other details:    BP (!) 148/73 (BP Location: Right Arm, Patient Position: Sitting)   Pulse 94   Temp 97.9 F (36.6 C) (Oral)   Resp 20   Ht _0  (1.626 m)   Wt 206 lb 6.4 oz (93.6 kg)   SpO2 100%   BMI 35.43 kg/m    Wt Readings from Last 3 Encounters:  10/19/18 206 lb 6.4 oz (93.6 kg)  10/10/18 204 lb 11.2 oz (92.9 kg)  09/20/14 183 lb 11.2 oz (83.3 kg)      Duong Haydel, Stephani Police, RN 10/14/2018,9:46 AM

## 2018-10-18 ENCOUNTER — Other Ambulatory Visit: Payer: Self-pay | Admitting: Internal Medicine

## 2018-10-18 ENCOUNTER — Other Ambulatory Visit (HOSPITAL_COMMUNITY): Payer: Self-pay | Admitting: Internal Medicine

## 2018-10-18 DIAGNOSIS — I639 Cerebral infarction, unspecified: Secondary | ICD-10-CM

## 2018-10-19 ENCOUNTER — Encounter: Payer: Self-pay | Admitting: Radiation Oncology

## 2018-10-19 ENCOUNTER — Ambulatory Visit
Admission: RE | Admit: 2018-10-19 | Discharge: 2018-10-19 | Disposition: A | Discharge status: Home / Self Care | Payer: Medicare Other | Source: Ambulatory Visit | Attending: Radiation Oncology | Admitting: Radiation Oncology

## 2018-10-19 ENCOUNTER — Other Ambulatory Visit: Payer: Self-pay

## 2018-10-19 VITALS — BP 148/73 | HR 94 | Temp 97.9°F | Resp 20 | Ht 64.0 in | Wt 206.4 lb

## 2018-10-19 DIAGNOSIS — I10 Essential (primary) hypertension: Secondary | ICD-10-CM | POA: Insufficient documentation

## 2018-10-19 DIAGNOSIS — R32 Unspecified urinary incontinence: Secondary | ICD-10-CM | POA: Diagnosis not present

## 2018-10-19 DIAGNOSIS — K5909 Other constipation: Secondary | ICD-10-CM | POA: Insufficient documentation

## 2018-10-19 DIAGNOSIS — E119 Type 2 diabetes mellitus without complications: Secondary | ICD-10-CM | POA: Insufficient documentation

## 2018-10-19 DIAGNOSIS — Z170 Estrogen receptor positive status [ER+]: Secondary | ICD-10-CM | POA: Diagnosis not present

## 2018-10-19 DIAGNOSIS — Z79899 Other long term (current) drug therapy: Secondary | ICD-10-CM | POA: Insufficient documentation

## 2018-10-19 DIAGNOSIS — Z87891 Personal history of nicotine dependence: Secondary | ICD-10-CM | POA: Insufficient documentation

## 2018-10-19 DIAGNOSIS — C50211 Malignant neoplasm of upper-inner quadrant of right female breast: Secondary | ICD-10-CM | POA: Insufficient documentation

## 2018-10-19 DIAGNOSIS — R5383 Other fatigue: Secondary | ICD-10-CM | POA: Diagnosis not present

## 2018-10-19 DIAGNOSIS — M129 Arthropathy, unspecified: Secondary | ICD-10-CM | POA: Insufficient documentation

## 2018-10-19 DIAGNOSIS — R531 Weakness: Secondary | ICD-10-CM | POA: Insufficient documentation

## 2018-10-19 DIAGNOSIS — Z803 Family history of malignant neoplasm of breast: Secondary | ICD-10-CM | POA: Insufficient documentation

## 2018-10-19 DIAGNOSIS — Z86010 Personal history of colonic polyps: Secondary | ICD-10-CM | POA: Diagnosis not present

## 2018-10-19 DIAGNOSIS — E785 Hyperlipidemia, unspecified: Secondary | ICD-10-CM | POA: Diagnosis not present

## 2018-10-19 DIAGNOSIS — Z8673 Personal history of transient ischemic attack (TIA), and cerebral infarction without residual deficits: Secondary | ICD-10-CM | POA: Diagnosis not present

## 2018-10-19 DIAGNOSIS — E669 Obesity, unspecified: Secondary | ICD-10-CM | POA: Insufficient documentation

## 2018-10-19 DIAGNOSIS — Z8042 Family history of malignant neoplasm of prostate: Secondary | ICD-10-CM | POA: Diagnosis not present

## 2018-10-19 DIAGNOSIS — Z7982 Long term (current) use of aspirin: Secondary | ICD-10-CM | POA: Insufficient documentation

## 2018-10-19 NOTE — Progress Notes (Signed)
Radiation Oncology         [phone] ________________________________  Initial Outpatient Consultation  Name: Carolyn Weber MRN: [id number]  Date: 10/19/2018  DOB: 01/25/1941  CC:Polite, Ronald, MD  Ingram, Haywood, MD   REFERRING PHYSICIAN: Ingram, Haywood, MD  DIAGNOSIS:    ICD-10-CM   1. 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 female who presented with right breast abnormality on the following imaging: Bilateral screening mammogram on the date of 09/12/18.  She was asymptomatic at that time.   Ultrasound on 09/19/18 revealed a lesion in the right breast at 2:30, 3 cm from the nipple, measuring 0.9 x 0.6 x 0.8 cm.   Ultrasound of the right axilla was negative. Biopsy on date of 09/21/18 showed invasive ductal carcinoma; ER positive, PR positive, Her2 negative, Grade 1-2.  The patient is scheduled for right breast lumpectomy with Dr. Ingram on 11/11/18. She has been referred today for discussion of potential radiation treatment options. She is accompanied by her daughter, Petra.  On review of systems, the patient reports urinary incontinence. She reports chronic constipation. She reports fatigue and generalized weakness. She reports a recent TIA and will undergo carotid ultrasound and see neurology.  PREVIOUS RADIATION THERAPY: No  PAST MEDICAL HISTORY:  has a past medical history of Allergy,  Arthritis, Colon polyps, Diabetes mellitus without complication (HCC), Diverticulitis, Hyperlipemia, Hypertension, and Obesity.    PAST SURGICAL HISTORY: Past Surgical History:  Procedure Laterality Date  . BREAST EXCISIONAL BIOPSY Right    about 30 years ago.   . BREAST SURGERY    . calcium removal from breast    . right breast biopsy Right 09/21/2018  . urinary sling      FAMILY HISTORY: family history includes Breast cancer in her brother, maternal aunt, and mother; Cancer in her mother; Colon polyps in her brother, brother, and brother; Diabetes in her father and mother; Heart disease in her father and mother; Hyperlipidemia in her father and mother; Hypertension in her father and mother; Prostate cancer in her brother and father; Stroke in her mother.  SOCIAL HISTORY:  reports that she quit smoking about 39 years ago. She has never used smokeless tobacco. She reports that she does not drink alcohol or use drugs.  ALLERGIES: Patient has no known allergies.  MEDICATIONS:  Current Outpatient Medications  Medication Sig Dispense Refill  . acetaminophen (TYLENOL) 650 MG CR tablet Take 650 mg by mouth every 8 (eight) hours as needed for pain.    . amLODipine (NORVASC) 10 MG tablet     . aspirin 81 MG tablet Take 81 mg by mouth daily.    . benzonatate (TESSALON) 100 MG capsule Take 1 capsule (100 mg total) by mouth 3 (three) times daily as needed for cough. 40 capsule 0  . Besifloxacin HCl (BESIVANCE OP) Apply to eye.    . calcium carbonate (OS-CAL) 600 MG TABS Take 600 mg by mouth 2 (two)   times daily with a meal.    . Cetirizine HCl (ZYRTEC PO) Take by mouth.    . cholecalciferol (VITAMIN D) 1000 UNITS tablet Take 1,000 Units by mouth daily.    . clobetasol cream (TEMOVATE) 0.05 % Apply 1 application topically daily as needed.    . DiphenhydrAMINE HCl (BENADRYL ALLERGY PO) Take 1 tablet by mouth. 1 tablet one to two times a day as needed    . Docusate Calcium (STOOL SOFTENER PO) Take  by mouth.    . imipramine (TOFRANIL) 50 MG tablet Take 50 mg by mouth at bedtime.    . Multiple Vitamin (MULTIVITAMIN) tablet Take 1 tablet by mouth daily.    . oxybutynin (OXYTROL) 3.9 MG/24HR Place 1 patch onto the skin every 3 (three) days.    . PAZEO 0.7 % SOLN     . Polyethyl Glycol-Propyl Glycol (SYSTANE OP) Apply to eye.    . simvastatin (ZOCOR) 10 MG tablet Take 20 mg by mouth at bedtime.     No current facility-administered medications for this encounter.     REVIEW OF SYSTEMS: A 10+ POINT REVIEW OF SYSTEMS WAS OBTAINED including neurology, dermatology, psychiatry, cardiac, respiratory, lymph, extremities, GI, GU, Musculoskeletal, constitutional, breasts, reproductive, HEENT.  All pertinent positives are noted in the HPI.  All others are negative.   PHYSICAL EXAM:  height is 5' 4" (1.626 m) and weight is 206 lb 6.4 oz (93.6 kg). Her oral temperature is 97.9 F (36.6 C). Her blood pressure is 148/73 (abnormal) and her pulse is 94. Her respiration is 20 and oxygen saturation is 100%.   General: Alert and oriented, in no acute distress. HEENT: Head is normocephalic. Extraocular movements are intact. Oral cavity is clear. Neck: Neck is supple, no palpable cervical or supraclavicular lymphadenopathy. Heart: Regular in rate and rhythm with no murmurs, rubs, or gallops. Chest: Clear to auscultation bilaterally, with no rhonchi, wheezes, or rales. Abdomen: Soft, nontender, nondistended, with no rigidity or guarding. Extremities: Mild edema in bilateral ankles. Lymphatics: see Neck Exam Skin: No concerning lesions. Musculoskeletal: Symmetric strength and muscle tone throughout. Neurologic: Cranial nerves II through XII are grossly intact. No obvious focalities. Speech is fluent. Coordination is intact. Psychiatric: Judgment and insight are intact. Affect is appropriate. Breasts: She has a post-biopsy change in the 3:30 region of the right breast, and there's a mass that is about 1 cm in  size. No other palpable masses appreciated in the breasts or axillae.   ECOG = 1  0 - Asymptomatic (Fully active, able to carry on all predisease activities without restriction)  1 - Symptomatic but completely ambulatory (Restricted in physically strenuous activity but ambulatory and able to carry out work of a light or sedentary nature. For example, light housework, office work)  2 - Symptomatic, <50% in bed during the day (Ambulatory and capable of all self care but unable to carry out any work activities. Up and about more than 50% of waking hours)  3 - Symptomatic, >50% in bed, but not bedbound (Capable of only limited self-care, confined to bed or chair 50% or more of waking hours)  4 - Bedbound (Completely disabled. Cannot carry on any self-care. Totally confined to bed or chair)  5 - Death   Oken MM, Creech RH, Tormey DC, et al. (1982). "Toxicity and response criteria of the Eastern Cooperative Oncology Group". Am. J. Clin. Oncol. 5 (6): 649-55   LABORATORY DATA:  Lab Results  Component Value Date   WBC 11.4 (A) 07/09/2014     HGB 11.6 (A) 07/09/2014   HCT 36.1 (A) 07/09/2014   MCV 86.6 07/09/2014   PLT 181 06/26/2010   CMP     Component Value Date/Time   NA 135 07/09/2014 1243   K 3.7 07/09/2014 1243   CL 96 07/09/2014 1243   CO2 28 07/09/2014 1243   GLUCOSE 209 (H) 07/09/2014 1243   BUN 43 (H) 07/09/2014 1243   CREATININE 2.45 (H) 07/09/2014 1243   CALCIUM 9.7 07/09/2014 1243   PROT 7.0 07/09/2014 1243   ALBUMIN 3.6 07/09/2014 1243   AST 23 07/09/2014 1243   ALT 12 07/09/2014 1243   ALKPHOS 63 07/09/2014 1243   BILITOT 0.7 07/09/2014 1243   GFRNONAA 53 (L) 06/26/2010 0715   GFRAA  06/26/2010 0715    >60        The eGFR has been calculated using the MDRD equation. This calculation has not been validated in all clinical situations. eGFR's persistently <60 mL/min signify possible Chronic Kidney Disease.         RADIOGRAPHY: Mr Jeri Cos TI  Contrast  Result Date: 10/12/2018 CLINICAL DATA:  Trunk feeling since Monday.  Gait disorder EXAM: MRI HEAD WITHOUT AND WITH CONTRAST TECHNIQUE: Multiplanar, multiecho pulse sequences of the brain and surrounding structures were obtained without and with intravenous contrast. CONTRAST:  21m MULTIHANCE GADOBENATE DIMEGLUMINE 529 MG/ML IV SOLN COMPARISON:  Head CT 12/16/2015 FINDINGS: Brain: Hazy DWI hyperintensity in the left pons respecting midline with variable mainly ADC hyperintense signal. Centrally there is ovoid enhancement measuring up to 9 mm. Findings would be compatible with a subacute infarct, but do not match history of symptoms beginning on Monday. Short follow-up is needed given history of recent diagnosis breast cancer. There are no other enhancing foci within the brain to increase chances of metastatic disease. There is a background of chronic small vessel ischemia with mild to moderate gliosis in the cerebral white matter. 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 No acute hemorrhage, hydrocephalus, or collection. Age normal brain volume Vascular: Major flow voids and vascular enhancements are preserved Skull and upper cervical spine: No focal marrow lesion. Disc and facet degeneration in the upper cervical spine with advanced disc narrowing C4-5. Sinuses/Orbits: Negative Other: These results will be called to the ordering clinician or representative by the Radiologist Assistant, and communication documented in the PACS or zVision Dashboard. IMPRESSION: 1. Lesion in the left paramedian pons with morphology most likely reflecting infarct. Assuming infarct, enhancement and diffusion signal suggests subacute timing. Given discrepancy with symptoms since Monday and history of breast cancer follow-up is recommended in 6-8 weeks to exclude differential consideration of solitary metastasis. 2. There is a background of chronic small vessel ischemia. Electronically  Signed   By: JMonte FantasiaM.D.   On: 10/12/2018 10:31   [place]ast Ltd Uni Right Inc Axilla  Result Date: 09/19/2018 CLINICAL DATA:  Callback from screening mammogram for possible mass right breast EXAM: DIGITAL DIAGNOSTIC RIGHT MAMMOGRAM WITH TOMO ULTRASOUND RIGHT BREAST COMPARISON:  Previous exam(s). ACR Breast Density Category b: There are scattered areas of fibroglandular density. FINDINGS: Spot compression CC and MLO views of the right breast are submitted. Previously questioned mass persists on additional views in the medial right breast. Targeted ultrasound is performed, showing slight irregular border hypoechoic lesion at the right breast 2:30 o'clock 3 cm from nipple measuring 0.9 x 0.6 x 0.8 cm. This correlates to the mammographic finding. Ultrasound of the right axilla is negative. IMPRESSION: Suspicious  findings. RECOMMENDATION: Ultrasound-guided core biopsy of right breast mass. I have discussed the findings and recommendations with the patient. Results were also provided in writing at the conclusion of the visit. If applicable, a reminder letter will be sent to the patient regarding the next appointment. BI-RADS CATEGORY  4: Suspicious. Electronically Signed   By: Abelardo Diesel M.D.   On: 09/19/2018 13:23   Mm Diag Breast Tomo Uni Right  Result Date: 09/19/2018 CLINICAL DATA:  Callback from screening mammogram for possible mass right breast EXAM: DIGITAL DIAGNOSTIC RIGHT MAMMOGRAM WITH TOMO ULTRASOUND RIGHT BREAST COMPARISON:  Previous exam(s). ACR Breast Density Category b: There are scattered areas of fibroglandular density. FINDINGS: Spot compression CC and MLO views of the right breast are submitted. Previously questioned mass persists on additional views in the medial right breast. Targeted ultrasound is performed, showing slight irregular border hypoechoic lesion at the right breast 2:30 o'clock 3 cm from nipple measuring 0.9 x 0.6 x 0.8 cm. This correlates to the mammographic  finding. Ultrasound of the right axilla is negative. IMPRESSION: Suspicious findings. RECOMMENDATION: Ultrasound-guided core biopsy of right breast mass. I have discussed the findings and recommendations with the patient. Results were also provided in writing at the conclusion of the visit. If applicable, a reminder letter will be sent to the patient regarding the next appointment. BI-RADS CATEGORY  4: Suspicious. Electronically Signed   By: Abelardo Diesel M.D.   On: 09/19/2018 13:23   Mm Clip Placement Right  Result Date: 09/21/2018 CLINICAL DATA:  Confirmation of clip placement after ultrasound-guided core needle biopsy of a new mass involving the UPPER INNER QUADRANT of the RIGHT breast EXAM: DIAGNOSTIC RIGHT MAMMOGRAM POST ULTRASOUND BIOPSY COMPARISON:  Previous exam(s). FINDINGS: Mammographic images were obtained following ultrasound guided biopsy of a mass involving the UPPER INNER QUADRANT of RIGHT breast. The ribbon shaped tissue marker clip is appropriately positioned within the biopsied mass in the [place] at MIDDLE depth. Expected post biopsy changes are present without evidence of hematoma. Architectural distortion in the breast from the surgical scar related to the remote excisional biopsy is again noted. IMPRESSION: Appropriate positioning of the ribbon shaped tissue marker clip within the biopsied mass in the [place] of the RIGHT breast at MIDDLE depth. Final Assessment: Post Procedure Mammograms for Marker Placement Electronically Signed   By: Evangeline Dakin M.D.   On: 09/21/2018 16:31   [place] Rt Breast Bx W Loc Dev 1st Lesion Img Bx Spec US Guide  Addendum Date: 09/23/2018   ADDENDUM REPORT: 09/23/2018 07:31 ADDENDUM: PATHOLOGY ADDENDUM: Pathology: GRADE 1-2 INVASIVE DUCTAL CARCINOMA. Pathology concordant with imaging findings: Yes. Because the patient was in [place] on 09/22/2018, I was able to discuss these results and recommendations with her in person at the  [place] [facility] (where I was working). She reports doing well after the biopsy without significant pain or bleeding at the biopsy site. RECOMMENDATION: Surgical consultation for excision. Medical Oncology and Radiation Oncology consultation thereafter. An appointment has been made with Dr. Fanny Skates of [facility] on December 3 at 2:45 p.m. Localization/excision considerations: Routine radioactive seed localization. Electronically Signed   By: Evangeline Dakin M.D.   On: 09/23/2018 07:31   Result Date: 09/23/2018 CLINICAL DATA:  77 year old with a remote prior benign excisional biopsy from the RIGHT breast with a screening detected new approximate 9 mm mass involving the INNER RIGHT breast at the 2:30 o'clock position approximately 3 cm from nipple. EXAM: ULTRASOUND GUIDED RIGHT  BREAST CORE NEEDLE BIOPSY COMPARISON:  Previous exam(s). FINDINGS: I met with the patient and we discussed the procedure of ultrasound-guided biopsy, including benefits and alternatives. We discussed the high likelihood of a successful procedure. We discussed the risks of the procedure, including infection, bleeding, tissue injury, clip migration, and inadequate sampling. Informed written consent was given. The usual time-out protocol was performed immediately prior to the procedure. Lesion quadrant: UPPER INNER QUADRANT. Using sterile technique with chlorhexidine as skin antisepsis, 1% lidocaine and 1% lidocaine with epinephrine as local anesthetic, under direct ultrasound visualization, a 12 gauge Bard Marquee core needle device placed through an 11 gauge introducer needle was used to perform biopsy of the mass involving the UPPER INNER RIGHT breast using a INFERIOR approach. At the conclusion of the procedure a ribbon shaped tissue marker clip was deployed into the biopsy cavity. Follow up 2 view mammogram was performed and dictated separately. IMPRESSION: Ultrasound guided biopsy of a new 9 mm mass  involving the UPPER INNER QUADRANT of the RIGHT breast. No apparent complications. Electronically Signed: By: Evangeline Dakin M.D. On: 09/21/2018 16:31      IMPRESSION/PLAN: Right Breast Cancer   It was a pleasure meeting the patient today. For the patient's early stage favorable risk breast cancer, we had a thorough discussion about her options for adjuvant therapy. One option would be antiestrogen therapy as discussed with medical oncology. She would take a pill for approximately 5 years. She plans to take Letrozole. The most aggressive option would be to pursue both the pill and 3-4 weeks of radiotherapy.  Of note, I discussed the data from the [employer] al trial in the [place] [facility]. She understands that anti-estrogens compared to radiation plus anti-estrogens demonstrated no survival benefit among the women in this study. The women were 18 years or older with stage I estrogen receptor positive breast cancer. Extrapolating from this study, I told Ms. Weare that her overall life expectancy should not be affected by adding radiotherapy to antiestrogen medication. She understands that the main benefit of radiotherapy would be a very small but measurable local control benefit (risk of local recurrence to be lowered from ~10% to ~2%).    We discussed the risks, benefits, and side effects of radiotherapy. She understands that the side effects would likely include some skin irritation and fatigue during the weeks of radiation. There is a risk of late effects which include but are not necessarily limited to cosmetic changes and rare heart or lung toxicity. No guarantees of treatment were given. I would anticipate delivering approximately 3-4 weeks of radiotherapy.   After discussing, she would like to take antiestrogen therapy alone after surgery. This is a fine choice. I will only see her PRN, such as if the tumor pathology is significantly more advanced than anticipated.       __________________________________________   Eppie Gibson, MD  This document serves as a record of services personally performed by Eppie Gibson, MD. It was created on her behalf by Rae Lips, a trained medical scribe. The creation of this record is based on the scribe's personal observations and the provider's statements to them. This document has been checked and approved by the attending provider.

## 2018-10-20 ENCOUNTER — Inpatient Hospital Stay: Payer: Medicare Other

## 2018-10-20 ENCOUNTER — Encounter: Payer: Self-pay | Admitting: Licensed Clinical Social Worker

## 2018-10-20 ENCOUNTER — Inpatient Hospital Stay (HOSPITAL_BASED_OUTPATIENT_CLINIC_OR_DEPARTMENT_OTHER): Payer: Medicare Other | Admitting: Licensed Clinical Social Worker

## 2018-10-20 ENCOUNTER — Encounter: Payer: Self-pay | Admitting: General Practice

## 2018-10-20 DIAGNOSIS — Z7183 Encounter for nonprocreative genetic counseling: Secondary | ICD-10-CM

## 2018-10-20 DIAGNOSIS — C50211 Malignant neoplasm of upper-inner quadrant of right female breast: Secondary | ICD-10-CM

## 2018-10-20 DIAGNOSIS — Z170 Estrogen receptor positive status [ER+]: Secondary | ICD-10-CM | POA: Diagnosis not present

## 2018-10-20 DIAGNOSIS — Z801 Family history of malignant neoplasm of trachea, bronchus and lung: Secondary | ICD-10-CM | POA: Diagnosis not present

## 2018-10-20 DIAGNOSIS — Z8042 Family history of malignant neoplasm of prostate: Secondary | ICD-10-CM | POA: Insufficient documentation

## 2018-10-20 DIAGNOSIS — Z803 Family history of malignant neoplasm of breast: Secondary | ICD-10-CM | POA: Diagnosis not present

## 2018-10-20 NOTE — Progress Notes (Signed)
REFERRING PROVIDER: Fanny Skates, MD [place], [place] [address]  PRIMARY PROVIDER:  Seward Carol, MD  PRIMARY REASON FOR VISIT:  1. Malignant neoplasm of upper-inner quadrant of right breast in female, estrogen receptor positive ([place])   2. Family history of breast cancer   3. Family history of prostate cancer   4. Family history of lung cancer      HISTORY OF PRESENT ILLNESS:   Carolyn Weber, a 77 y.o. female, was seen for a [place] cancer genetics consultation at the request of Dr. Dalbert Batman due to a personal and family history of cancer.  [name] presents to clinic today to discuss the possibility of a hereditary predisposition to cancer, genetic testing, and to further clarify her future cancer risks, as well as potential cancer risks for family members.   In 2019, at the age of 36, [name] was diagnosed with breast cancer of the right breast, ER+, PR+, HER2-. The treatment plan includes surgery, which is scheduled for 11/11/18, possible chemotherapy, adjuvant radiation, and adjuvant antiestrogen therapy.   CANCER HISTORY:    Malignant neoplasm of upper-inner quadrant of right breast in female, estrogen receptor positive ([place])   09/21/2018 Initial Diagnosis    Screening mammogram detected right breast mass 2:30 position.  3 cm from nipple 0.9 x 0.6 x 0.8 cm, ultrasound axilla negative biopsy revealed grade 1-2 IDC ER 95%, PR 90%, HER-2 1+ negative, Ki-67 5%, T1b N0 stage Ia clinical stage    10/10/2018 Cancer Staging    Staging form: Breast, AJCC 8th Edition - Clinical: Stage IA (cT1b, cN0, cM0, G2, ER+, PR+, HER2-) - Signed by Nicholas Lose, MD on 10/10/2018      HORMONAL RISK FACTORS:  Menarche was at age 61.  First live birth at age 62.  OCP use for approximately unknown years.  Ovaries intact: yes.  Hysterectomy: no.  Menopausal status: postmenopausal.  HRT use: 10 years. Colonoscopy: yes; cumulative 3 hyperplastic polyps. Mammogram within the  last year: yes. Number of breast biopsies: 2.  Past Medical History:  Diagnosis Date  . Allergy   . Arthritis   . Colon polyps   . Diabetes mellitus without complication ([place])   . Diverticulitis   . Family history of breast cancer   . Family history of lung cancer   . Family history of prostate cancer   . Hyperlipemia   . Hypertension   . Obesity     Past Surgical History:  Procedure Laterality Date  . BREAST EXCISIONAL BIOPSY Right    about 30 years ago.   Marland Kitchen BREAST SURGERY    . calcium removal from breast    . right breast biopsy Right 09/21/2018  . urinary sling      Social History   Socioeconomic History  . Marital status: Divorced    Spouse name: Not on file  . Number of children: 2  . Years of education: Not on file  . Highest education level: Not on file  Occupational History  . Occupation: semi-retired  Social Needs  . Financial resource strain: Not on file  . Food insecurity:    Worry: Not on file    Inability: Not on file  . Transportation needs:    Medical: No    Non-medical: No  Tobacco Use  . Smoking status: Former Smoker    Last attempt to quit: 11/02/1978    Years since quitting: 39.9  . Smokeless tobacco: Never Used  Substance and Sexual Activity  .  Alcohol use: No  . Drug use: No  . Sexual activity: Not on file  Lifestyle  . Physical activity:    Days per week: Not on file    Minutes per session: Not on file  . Stress: Not on file  Relationships  . Social connections:    Talks on phone: Not on file    Gets together: Not on file    Attends religious service: Not on file    Active member of club or organization: Not on file    Attends meetings of clubs or organizations: Not on file    Relationship status: Not on file  Other Topics Concern  . Not on file  Social History Narrative  . Not on file     FAMILY HISTORY:  We obtained a detailed, 4-generation family history.  Significant diagnoses are listed below: Family History   Problem Relation Age of Onset  . Breast cancer Mother        67s  . Diabetes Mother   . Heart disease Mother   . Cancer Mother   . Hyperlipidemia Mother   . Hypertension Mother   . Stroke Mother   . Prostate cancer Father        47s  . Diabetes Father   . Heart disease Father   . Hyperlipidemia Father   . Hypertension Father   . Breast cancer Brother 18  . Prostate cancer Brother        60s  . Colon polyps Brother   . Breast cancer Maternal Aunt        61s  . Prostate cancer Maternal Uncle   . Lung cancer Paternal Grandmother   . Breast cancer Maternal Aunt        9s  . Breast cancer Cousin    [name] has a son, Jonairous or "John", 66, and a daughter, [name], 23. She has a full brother who had breast cancer diagnosed at 78 and is living at 7. He had a son who had neck cancer and died in his late 57s. The patient also has two half brothers through her mother. One of the half brothers had prostate cancer diagnosed in his 97s and died in his 16s, but not due to the prostate cancer. The other half brother has a history of colon polyps and is living at 6. \  [name]'s mother had breast cancer diagnosed in her late 27s and it recurred twice, she died at 31. She had three brothers and three sisters. Two of her sisters also had breast cancer, diagnosed in their 4s. One of her brothers had prostate cancer. Three of [name]'s maternal cousins also had breast cancer. Another maternal cousin had cancer, unknown type. [name] maternal grandmother died at 22, grandfather died at 66.  [name] father had prostate cancer and died in his 52's. He had three brothers, no cancers. [name] has limited information about her paternal cousins, but is unaware of cancers. Her paternal grandmother had lung cancer and died in her 8s. Her grandfather died in his 48s.  [name] is unaware of previous family history of genetic testing for hereditary cancer risks. Patient's maternal ancestors are of  African American/American Indian/Caucasian descent, and paternal ancestors are of African American descent. There is no reported Ashkenazi Jewish ancestry. There is no known consanguinity.  GENETIC COUNSELING ASSESSMENT: LATRISA HELLUMS is a 77 y.o. female with a personal and family history of breast cancer which is somewhat suggestive of a  Hereditary Cancer Predisposition Syndrome. We, therefore, discussed and recommended the following at today's visit.   DISCUSSION: We discussed that about 5-10% of breast cancer cases are hereditary with most cases due to BRCA mutations.  Other genes associated with hereditary breast cancer cases include ATM, CHEK2 and PALB2.  We reviewed the characteristics, features and inheritance patterns of hereditary cancer syndromes. We also discussed genetic testing, including the appropriate family members to test, the process of testing, insurance coverage and turn-around-time for results. We discussed the implications of a negative, positive and/or variant of uncertain significant result. Ms. Lovan reports that she would not change her mind about the current surgical plan based on this testing. Therefore, we recommended Ms. Najarro pursue genetic testing for the [facility] Multi-Cancer Panel.   The Multi-Cancer Panel offered by [facility] includes sequencing and/or deletion duplication testing of the following 84 genes: AIP, ALK, APC, ATM, AXIN2,BAP1,  BARD1, BLM, BMPR1A, BRCA1, BRCA2, BRIP1, CASR, CDC73, CDH1, CDK4, CDKN1B, CDKN1C, CDKN2A (p14ARF), CDKN2A (p16INK4a), CEBPA, CHEK2, CTNNA1, DICER1, DIS3L2, EGFR (c.2369C>T, p.Thr790Met variant only), EPCAM (Deletion/duplication testing only), FH, FLCN, GATA2, GPC3, GREM1 (Promoter region deletion/duplication testing only), HOXB13 (c.251G>A, p.Gly84Glu), HRAS, KIT, MAX, MEN1, MET, MITF (c.952G>A, p.Glu318Lys variant only), MLH1, MSH2, MSH3, MSH6, MUTYH, NBN, NF1, NF2, NTHL1, PALB2, PDGFRA, PHOX2B, PMS2, POLD1, POLE, POT1, PRKAR1A, PTCH1, PTEN,  RAD50, RAD51C, RAD51D, RB1, RECQL4, RET, RUNX1, SDHAF2, SDHA (sequence changes only), SDHB, SDHC, SDHD, SMAD4, SMARCA4, SMARCB1, SMARCE1, STK11, SUFU, TERC, TERT, TMEM127, TP53, TSC1, TSC2, VHL, WRN and WT1.   We discussed that if she is found to have a mutation in one of these genes, it may impact surgical decisions, and alter future medical management recommendations such as increased cancer screenings and consideration of risk reducing surgeries.  A positive result could also have implications for the patient's family members.  A Negative result would mean we were unable to identify a hereditary component to her cancer, but does not rule out the possibility of a hereditary basis for her cancer.  There could be mutations that are undetectable by current technology, or in genes not yet tested or identified to increase cancer risk.    We discussed the potential to find a Variant of Uncertain Significance or VUS.  These are variants that have not yet been identified as pathogenic or benign, and it is unknown if this variant is associated with increased cancer risk or if this is a normal finding.  Most VUS's are reclassified to benign or likely benign.   It should not be used to make medical management decisions. With time, we suspect the lab will determine the significance of any VUS's identified if any.   Based on Ms. Kluender's personal and family history of cancer, she meets NCCN medical criteria for genetic testing. Despite that she meets criteria, she may still have an out of pocket cost.  PLAN: After considering the risks, benefits, and limitations, Ms. Moger  provided informed consent to pursue genetic testing and the blood sample was sent to [facility] for analysis of the Multi-Cancer Panel. Results should be available within approximately 2-3 weeks' time, at which point they will be disclosed by telephone to Ms. Spillers, as will any additional recommendations warranted by these results. Ms. Mccown  will receive a summary of her genetic counseling visit and a copy of her results once available. This information will also be available in Epic.   Based on Ms. Schoenfeld's family history, we recommended her siblings and maternal relatives, have genetic counseling and testing.  Ms. Privett will let us know if we can be of any assistance in coordinating genetic counseling and/or testing for this family member.   Lastly, we encouraged Ms. Brinton to remain in contact with cancer genetics annually so that we can continuously update the family history and inform her of any changes in cancer genetics and testing that may be of benefit for this family.   Ms.  Hefty questions were answered to her satisfaction today. Our contact information was provided should additional questions or concerns arise. Thank you for the referral and allowing [place] to share in the care of your patient.   Faith Rogue, MS Genetic Counselor [email or web address] Phone: [phone]   The patient was seen for a total of 45 minutes in face-to-face genetic counseling.  The patient was accompanied today by her daughter, [name].

## 2018-10-20 NOTE — Progress Notes (Signed)
[facility] Psychosocial Distress Screening Clinical Social Work  Clinical Social Work was referred by distress screening protocol.  The patient scored a 5 on the Psychosocial Distress Thermometer which indicates moderate distress. Clinical Social Worker contacted patient by phone to assess for distress and other psychosocial needs. CSW and patient discussed common feeling and emotions when being diagnosed with cancer, and the importance of support during treatment. CSW informed patient of the support team and support services at [facility]. CSW provided contact information and encouraged patient to call with any questions or concerns.  Aware of upcoming appointments. 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  Easily tires at this point.  Has driven yesterday for first time since Thanksgiving, is somewhat anxious about ability to drive post stroke.  Will refer patient Solicitor for possible help.    ONCBCN DISTRESS SCREENING 10/19/2018  Screening Type Initial Screening  Distress experienced in past week (1-10) 5  Practical problem type Work/school    Clinical Social Worker follow up needed: No.  If yes, follow up plan:  Beverely Pace, [place], LCSW Clinical Social Worker Phone:  [phone]

## 2018-10-21 ENCOUNTER — Ambulatory Visit
Admission: RE | Admit: 2018-10-21 | Discharge: 2018-10-21 | Disposition: A | Discharge status: Home / Self Care | Payer: Medicare Other | Source: Ambulatory Visit | Attending: Internal Medicine | Admitting: Internal Medicine

## 2018-10-21 DIAGNOSIS — I639 Cerebral infarction, unspecified: Secondary | ICD-10-CM

## 2018-10-24 ENCOUNTER — Ambulatory Visit (HOSPITAL_COMMUNITY): Payer: Medicare Other | Attending: Cardiovascular Disease

## 2018-10-24 ENCOUNTER — Other Ambulatory Visit: Payer: Self-pay

## 2018-10-24 DIAGNOSIS — E669 Obesity, unspecified: Secondary | ICD-10-CM | POA: Insufficient documentation

## 2018-10-24 DIAGNOSIS — I77810 Thoracic aortic ectasia: Secondary | ICD-10-CM | POA: Insufficient documentation

## 2018-10-24 DIAGNOSIS — I119 Hypertensive heart disease without heart failure: Secondary | ICD-10-CM | POA: Insufficient documentation

## 2018-10-24 DIAGNOSIS — I639 Cerebral infarction, unspecified: Secondary | ICD-10-CM | POA: Insufficient documentation

## 2018-10-24 DIAGNOSIS — Z6835 Body mass index (BMI) 35.0-35.9, adult: Secondary | ICD-10-CM | POA: Diagnosis not present

## 2018-10-24 DIAGNOSIS — E785 Hyperlipidemia, unspecified: Secondary | ICD-10-CM | POA: Diagnosis not present

## 2018-10-24 DIAGNOSIS — E119 Type 2 diabetes mellitus without complications: Secondary | ICD-10-CM | POA: Diagnosis not present

## 2018-10-31 ENCOUNTER — Encounter

## 2018-10-31 ENCOUNTER — Telehealth: Payer: Self-pay | Admitting: Licensed Clinical Social Worker

## 2018-10-31 ENCOUNTER — Ambulatory Visit: Payer: Medicare Other | Admitting: Diagnostic Neuroimaging

## 2018-10-31 ENCOUNTER — Encounter: Payer: Self-pay | Admitting: Diagnostic Neuroimaging

## 2018-10-31 VITALS — BP 161/93 | HR 96 | Ht 64.0 in | Wt 203.8 lb

## 2018-10-31 DIAGNOSIS — I6350 Cerebral infarction due to unspecified occlusion or stenosis of unspecified cerebral artery: Secondary | ICD-10-CM | POA: Diagnosis not present

## 2018-10-31 DIAGNOSIS — I639 Cerebral infarction, unspecified: Secondary | ICD-10-CM

## 2018-10-31 NOTE — Progress Notes (Signed)
[facility]  PATIENT: Carolyn Weber DOB: Sep 08, 1941  REFERRING CLINICIAN: Polite, R HISTORY FROM: patient and cousin (daughter via phone) REASON FOR VISIT: new consult    HISTORICAL  CHIEF COMPLAINT:  Chief Complaint  Patient presents with  . Cerebrovascular Accident    rm 6, New Pt, cousin- Eliseo Squires, "MRI was inconclusive, Carotid US was NL"    HISTORY OF PRESENT ILLNESS:   77 year old female with history of hypertension, diabetes, hypercholesterolemia, recent diagnosis of stage Ia invasive ductal carcinoma, here for evaluation of stroke versus other pontine lesion.  October 02, 2018 patient went to sleep in normal state of health.  The next morning she woke up with gait and balance difficulty, having to hold onto walls to walk, difficulty driving, feeling funny.  She felt tired.  No unilateral numbness, weakness, slurred speech, nausea, headache, vision changes.  Symptoms continued for several days and then patient went to see PCP.  MRI of the brain was ordered and completed on 10/12/2018 which showed possible subacute left pontine ischemic infarction.  However due to slightly abnormal enhancement and appearance, possibility of underlying neoplasm was also raised.  Follow-up MRI in 4 to 6 weeks was recommended. Since that time patient's symptoms have gradually improved.  She feels about 90% back to normal.  She is able to drive again.  She is able to walk without staggering as much as before.  Patient was on aspirin prior to this MRI.  Now she is is changed to Plavix.  Statin dosing has slightly been increased.  Blood pressure medication is stable.  Her home blood pressure runs 120s / 70s.  Last hemoglobin A1c in June 2019 was 6.7.  Recent LDL was 125.  Patient also was recently diagnosed with breast cancer on 09/21/2018.  She is planning to undergo breast cancer surgery in January 2020.  This will be followed up with antiestrogen therapy.    REVIEW OF SYSTEMS:  Full 14 system review of systems performed and negative with exception of: Cramps constipation urination problems.  ALLERGIES: No Known Allergies  HOME MEDICATIONS: Outpatient Medications Prior to Visit  Medication Sig Dispense Refill  . acetaminophen (TYLENOL) 650 MG CR tablet Take 650 mg by mouth every 8 (eight) hours as needed for pain.    Marland Kitchen amLODipine (NORVASC) 10 MG tablet Take 10 mg by mouth daily.     Marland Kitchen Besifloxacin HCl (BESIVANCE) 0.6 % SUSP Place 1 drop into the right eye See admin instructions. Drop 1 drop in the right eye the day of and the day after eye injection.    . calcium carbonate (OS-CAL) 600 MG TABS Take 600 mg by mouth 2 (two) times daily with a meal.    . Cetirizine HCl (ZYRTEC PO) Take 10 mg by mouth daily.     . cholecalciferol (VITAMIN D) 1000 UNITS tablet Take 1,000 Units by mouth 2 (two) times daily.     . clobetasol cream (TEMOVATE) 6.29 % Apply 1 application topically daily as needed (irritation).     . clopidogrel (PLAVIX) 75 MG tablet Take 75 mg by mouth daily.    Mariane Baumgarten Calcium (STOOL SOFTENER PO) Take 100 mg by mouth 2 (two) times daily.     Marland Kitchen imipramine (TOFRANIL) 25 MG tablet Take 25 mg by mouth at bedtime.     . Multiple Vitamin (MULTIVITAMIN) tablet Take 0.5 tablets by mouth 2 (two) times daily.     Marland Kitchen OVER THE COUNTER MEDICATION Take 1 tablet by mouth 2 (two) times  daily. Viviscal Hair Growth Supplement    . oxybutynin (OXYTROL) 3.9 MG/24HR Place 1 patch onto the skin every 3 (three) days.    Marland Kitchen PAZEO 0.7 % SOLN Place 1 drop into both eyes every morning.     Vladimir Faster Glycol-Propyl Glycol (SYSTANE OP) Apply 1 drop to eye 3 (three) times daily as needed (dry eyes).     . simvastatin (ZOCOR) 10 MG tablet Take 20 mg by mouth 2 (two) times a week.     Marland Kitchen UNABLE TO FIND Med Name: hair vitamin- Viviscal 1 tab twice daily    . benzonatate (TESSALON) 100 MG capsule Take 1 capsule (100 mg total) by mouth 3 (three) times daily as needed for cough. (Patient not  taking: Reported on 10/27/2018) 40 capsule 0   No facility-administered medications prior to visit.     PAST MEDICAL HISTORY: Past Medical History:  Diagnosis Date  . Allergy   . Arthritis   . Cancer ([place]) 10/2018   breast  . Colon polyps   . Diabetes mellitus without complication ([place])   . Diverticulitis   . Family history of breast cancer   . Family history of lung cancer   . Family history of prostate cancer   . Hyperlipemia   . Hypertension   . Obesity     PAST SURGICAL HISTORY: Past Surgical History:  Procedure Laterality Date  . BREAST EXCISIONAL BIOPSY Right    about 30 years ago.   Marland Kitchen BREAST SURGERY    . calcium removal from breast    . right breast biopsy Right 09/21/2018  . urinary sling      FAMILY HISTORY: Family History  Problem Relation Age of Onset  . Breast cancer Mother        32s  . Diabetes Mother   . Heart disease Mother   . Cancer Mother   . Hyperlipidemia Mother   . Hypertension Mother   . Stroke Mother   . Prostate cancer Father        38s  . Diabetes Father   . Heart disease Father   . Hyperlipidemia Father   . Hypertension Father   . Breast cancer Brother 80  . Prostate cancer Brother        62s  . Colon polyps Brother   . Breast cancer Maternal Aunt        7s  . Prostate cancer Maternal Uncle   . Lung cancer Paternal Grandmother   . Breast cancer Maternal Aunt        56s  . Breast cancer Cousin     SOCIAL HISTORY: Social History   Socioeconomic History  . Marital status: Divorced    Spouse name: Not on file  . Number of children: 2  . Years of education: Not on file  . Highest education level: Bachelor's degree (e.g., BA, AB, BS)  Occupational History  . 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 Financial resource strain: Not on file  . Food insecurity:    Worry: Not on file    Inability: Not on file  . Transportation needs:    Medical: No    Non-medical: No  Tobacco Use  .  Smoking status: Former Smoker    Last attempt to quit: 11/02/1978    Years since quitting: 40.0  . Smokeless tobacco: Never Used  Substance and Sexual Activity  . Alcohol use: No  . Drug use: Never  . Sexual activity: Not  on file  Lifestyle  . Physical activity:    Days per week: Not on file    Minutes per session: Not on file  . Stress: Not on file  Relationships  . Social connections:    Talks on phone: Not on file    Gets together: Not on file    Attends religious service: Not on file    Active member of club or organization: Not on file    Attends meetings of clubs or organizations: Not on file    Relationship status: Not on file  . Intimate partner violence:    Fear of current or ex partner: Not on file    Emotionally abused: Not on file    Physically abused: Not on file    Forced sexual activity: Not on file  Other Topics Concern  . Not on file  Social History Narrative   Lives alone   No caffiene     PHYSICAL EXAM  GENERAL EXAM/CONSTITUTIONAL: Vitals:  Vitals:   10/31/18 0849  BP: (!) 161/93  Pulse: 96  Weight: 203 lb 12.8 oz (92.4 kg)  Height: _0  (1.626 m)     Body mass index is 34.98 kg/m. Wt Readings from Last 3 Encounters:  10/31/18 203 lb 12.8 oz (92.4 kg)  10/19/18 206 lb 6.4 oz (93.6 kg)  10/10/18 204 lb 11.2 oz (92.9 kg)     Patient is in no distress; well developed, nourished and groomed; neck is supple  CARDIOVASCULAR:  Examination of carotid arteries is normal; no carotid bruits  Regular rate and rhythm, no murmurs  Examination of peripheral vascular system by observation and palpation is normal  EYES:  Ophthalmoscopic exam of optic discs and posterior segments is normal; no papilledema or hemorrhages  Visual Acuity Screening   Right eye Left eye Both eyes  Without correction:     With correction: 20/40 20/30   Comments: bifocals    MUSCULOSKELETAL:  Gait, strength, tone, movements noted in Neurologic exam  below  NEUROLOGIC: MENTAL STATUS:  No flowsheet data found.  awake, alert, oriented to person, place and time  recent and remote memory intact  normal attention and concentration  language fluent, comprehension intact, naming intact  fund of knowledge appropriate  CRANIAL NERVE:   2nd - no papilledema on fundoscopic exam  2nd, 3rd, 4th, 6th - pupils equal and reactive to light, visual fields full to confrontation, extraocular muscles intact, no nystagmus; MILD SACCADIC BREAKDOWN OF SMOOTH PURSUIT  5th - facial sensation symmetric  7th - facial strength symmetric  8th - hearing intact  9th - palate elevates symmetrically, uvula midline  11th - shoulder shrug symmetric  12th - tongue protrusion midline  MOTOR:   normal bulk and tone, full strength in the BUE, BLE  SENSORY:   normal and symmetric to light touch, temperature, vibration; VIB AT TOES (4IGHT 9 SEC; LEFT 7 SEC)  COORDINATION:   finger-nose-finger, fine finger movements normal  REFLEXES:   deep tendon reflexes present and symmetric; TRACE AT ANKLES  GAIT/STATION:   narrow based gait; SMOOTH STRIDE; GOOD ARM SWING     DIAGNOSTIC DATA (LABS, IMAGING, TESTING) - I reviewed patient records, labs, notes, testing and imaging myself where available.  Lab Results  Component Value Date   WBC 11.4 (A) 07/09/2014   HGB 11.6 (A) 07/09/2014   HCT 36.1 (A) 07/09/2014   MCV 86.6 07/09/2014   PLT 181 06/26/2010      Component Value Date/Time   NA 135 07/09/2014  1243   K 3.7 07/09/2014 1243   CL 96 07/09/2014 1243   CO2 28 07/09/2014 1243   GLUCOSE 209 (H) 07/09/2014 1243   BUN 43 (H) 07/09/2014 1243   CREATININE 2.45 (H) 07/09/2014 1243   CALCIUM 9.7 07/09/2014 1243   PROT 7.0 07/09/2014 1243   ALBUMIN 3.6 07/09/2014 1243   AST 23 07/09/2014 1243   ALT 12 07/09/2014 1243   ALKPHOS 63 07/09/2014 1243   BILITOT 0.7 07/09/2014 1243   GFRNONAA 53 (L) 06/26/2010 0715   GFRAA  06/26/2010 0715     >60        The eGFR has been calculated using the MDRD equation. This calculation has not been validated in all clinical situations. eGFR's persistently <60 mL/min signify possible Chronic Kidney Disease.   No results found for: CHOL, HDL, LDLCALC, LDLDIRECT, TRIG, CHOLHDL Lab Results  Component Value Date   HGBA1C 6.5% 07/17/2016   No results found for: VITAMINB12 No results found for: TSH    10/24/18 TTE - Left ventricle: The cavity size was normal. There was moderate   hypertrophy of the basal septum with otherwise mild concentric   hypertrophy. Systolic function was normal. The estimated ejection   fraction was in the range of 60% to 65%. Wall motion was normal;   there were no regional wall motion abnormalities. Features are   consistent with a pseudonormal left ventricular filling pattern,   with concomitant abnormal relaxation and increased filling   pressure (grade 2 diastolic dysfunction). - Aortic valve: Transvalvular velocity was within the normal range.   There was no stenosis. There was no regurgitation. - Aorta: Ascending aortic diameter: 36 mm (S). - Ascending aorta: The ascending aorta was mildly dilated. - Mitral valve: Transvalvular velocity was within the normal range.   There was no evidence for stenosis. There was trivial   regurgitation. - Right ventricle: The cavity size was normal. Wall thickness was   normal. Systolic function was normal. - Atrial septum: No defect or patent foramen ovale was identified   by color flow Doppler. - Tricuspid valve: There was trivial regurgitation. - Pulmonary arteries: Systolic pressure was within the normal   range. PA peak pressure: 30 mm Hg (S).  10/21/18 carotid u/s - Less than 50% stenosis in the right and left internal carotid arteries.  10/12/18 MRI brain [I reviewed images myself and agree with interpretation. Subacute infarct vs neoplasm; lesion does cross midline to the right side. -VRP]  1. Lesion in  the left paramedian pons with morphology most likely reflecting infarct. Assuming infarct, enhancement and diffusion signal suggests subacute timing. Given discrepancy with symptoms since Monday and history of breast cancer follow-up is recommended in 6-8 weeks to exclude differential consideration of solitary metastasis. 2. There is a background of chronic small vessel ischemia.     ASSESSMENT AND PLAN  77 y.o. year old female here with sudden onset gait and balance difficulty, difficulty driving, fatigue, on October 03, 2017.  MRI on 10/12/2018 shows probable subacute ischemic infarction in the left paramedian pons.  Stroke work-up has been completed.   Dx:  1. Left pontine stroke (HCC)     PLAN:  PONTINE LESION (stroke vs neoplasm) - continue plavix, statin, BP control - repeat MRI brain w/wo in 4 weeks (follow up study; patient requesting this to be setup by oncology Dr. [name]) - PT evaluation for gait and stamina training  Orders Placed This Encounter  Procedures  . Ambulatory referral to Physical Therapy  Return pending test results and symptoms.    Penni Bombard, MD 97/84/7841, 2:82 AM Certified in Neurology, Neurophysiology and Neuroimaging  [facility] [address] [place], [place] [address] [phone]

## 2018-10-31 NOTE — Telephone Encounter (Signed)
Revealed negative genetic testing.  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  We discussed that we do not know why she has breast cancer or why there is cancer in the family. It could be due to a different gene that we are not testing, or something our current technology cannot pick up.  It will be important for her to keep in contact with genetics to learn if additional testing may be needed in the future. Anyone on her maternal side could have genetic counseling and/or testing. She should continue to follow doctors recommendations for cancer screenings.

## 2018-10-31 NOTE — Patient Instructions (Signed)
-   continue plavix, statin, BP control  - repeat MRI brain w/wo in 4 weeks (follow up study; will request to be setup by oncology)  - PT evaluation for gait and stamina training

## 2018-11-01 ENCOUNTER — Encounter: Payer: Self-pay | Admitting: Licensed Clinical Social Worker

## 2018-11-01 ENCOUNTER — Ambulatory Visit: Payer: Self-pay | Admitting: Licensed Clinical Social Worker

## 2018-11-01 DIAGNOSIS — Z1379 Encounter for other screening for genetic and chromosomal anomalies: Secondary | ICD-10-CM | POA: Insufficient documentation

## 2018-11-01 NOTE — Progress Notes (Signed)
HPI:  Carolyn Weber was previously seen in the [place] clinic on 10/20/2018 due to a personal and family history of breast cancer and concerns regarding a hereditary predisposition to cancer. Please refer to our prior cancer genetics clinic note for more information regarding Carolyn Weber's medical, social and family histories, and our assessment and recommendations, at the time. Carolyn Weber recent genetic test results were disclosed to her, as well as recommendations warranted by these results. These results and recommendations are discussed in more detail below.  CANCER HISTORY:    Malignant neoplasm of upper-inner quadrant of right breast in female, estrogen receptor positive ([place])   09/21/2018 Initial Diagnosis    Screening mammogram detected right breast mass 2:30 position.  3 cm from nipple 0.9 x 0.6 x 0.8 cm, ultrasound axilla negative biopsy revealed grade 1-2 IDC ER 95%, PR 90%, HER-2 1+ negative, Ki-67 5%, T1b N0 stage Ia clinical stage    10/10/2018 Cancer Staging    Staging form: Breast, AJCC 8th Edition - Clinical: Stage IA (cT1b, cN0, cM0, G2, ER+, PR+, HER2-) - Signed by Nicholas Lose, MD on 10/10/2018      FAMILY HISTORY:  We obtained a detailed, 4-generation family history.  Significant diagnoses are listed below: Family History  Problem Relation Age of Onset  . Breast cancer Mother        33s  . Diabetes Mother   . Heart disease Mother   . Cancer Mother   . Hyperlipidemia Mother   . Hypertension Mother   . Stroke Mother   . Prostate cancer Father        41s  . Diabetes Father   . Heart disease Father   . Hyperlipidemia Father   . Hypertension Father   . Breast cancer Brother 54  . Prostate cancer Brother        65s  . Colon polyps Brother   . Breast cancer Maternal Aunt        73s  . Prostate cancer Maternal Uncle   . Lung cancer Paternal Grandmother   . Breast cancer Maternal Aunt        82s  . Breast cancer Cousin    Carolyn Weber has a son,  Carolyn or "John", 43, and a daughter, Carolyn Weber, 17. She has a full brother who had breast cancer diagnosed at 28 and is living at 46. He had a son who had neck cancer and died in his late 56s. The patient also has two half brothers through her mother. One of the half brothers had prostate cancer diagnosed in his 33s and died in his 3s, but not due to the prostate cancer. The other half brother has a history of colon polyps and is living at 69.   Ms. Olmsted mother had breast cancer diagnosed in her late 58s and it recurred twice, she died at 46. She had three brothers and three sisters. Two of her sisters also had breast cancer, diagnosed in their 39s. One of her brothers had prostate cancer. Three of Ms. Glazebrook's maternal cousins also had breast cancer. Another maternal cousin had cancer, unknown type. Ms. Hahne maternal grandmother died at 30, grandfather died at 20.  Ms. Dredge father had prostate cancer and died in his 36's. He had three brothers, no cancers. Ms. Amadon has limited information about her paternal cousins, but is unaware of cancers. Her paternal grandmother had lung cancer and died in her 41s. Her grandfather died in his 42s.  Ms. Cavenaugh is unaware  of previous family history of genetic testing for hereditary cancer risks. Patient's maternal ancestors are of African American/American Indian/Caucasian descent, and paternal ancestors are of African American descent. There is no reported Ashkenazi Jewish ancestry. There is no known consanguinity.  GENETIC TEST RESULTS: Genetic testing performed through [facility]'s Multi-Cancer Panel reported out on 10/28/2018 showed no pathogenic mutations. The Multi-Cancer Panel offered by [facility] includes sequencing and/or deletion duplication testing of the following 84 genes: AIP, ALK, APC, ATM, AXIN2,BAP1,  BARD1, BLM, BMPR1A, BRCA1, BRCA2, BRIP1, CASR, CDC73, CDH1, CDK4, CDKN1B, CDKN1C, CDKN2A (p14ARF), CDKN2A (p16INK4a), CEBPA, CHEK2, CTNNA1, DICER1,  DIS3L2, EGFR (c.2369C>T, p.Thr790Met variant only), EPCAM (Deletion/duplication testing only), FH, FLCN, GATA2, GPC3, GREM1 (Promoter region deletion/duplication testing only), HOXB13 (c.251G>A, p.Gly84Glu), HRAS, KIT, MAX, MEN1, MET, MITF (c.952G>A, p.Glu318Lys variant only), MLH1, MSH2, MSH3, MSH6, MUTYH, NBN, NF1, NF2, NTHL1, PALB2, PDGFRA, PHOX2B, PMS2, POLD1, POLE, POT1, PRKAR1A, PTCH1, PTEN, RAD50, RAD51C, RAD51D, RB1, RECQL4, RET, RUNX1, SDHAF2, SDHA (sequence changes only), SDHB, SDHC, SDHD, SMAD4, SMARCA4, SMARCB1, SMARCE1, STK11, SUFU, TERC, TERT, TMEM127, TP53, TSC1, TSC2, VHL, WRN and WT1..  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   The test report will be scanned into EPIC and will be located under the Molecular Pathology section of the Results Review tab. A portion of the result report is included below for reference.     We discussed with Carolyn Weber that because current genetic testing is not perfect, it is possible there may be a gene mutation in one of these genes that current testing cannot detect, but that chance is small.  We also discussed, that there could be another gene that has not yet been discovered, or that we have not yet tested, that is responsible for the cancer diagnoses in the family. It is also possible there is a hereditary cause for the cancer in the family that Carolyn Weber did not inherit and therefore was not identified in her testing.  Therefore, it is important to remain in touch with cancer genetics in the future so that we can continue to offer Carolyn Weber the most up to date genetic testing.   Regarding the VUS in ALK: At this time, it is unknown if this variant is associated with increased cancer risk or if this is a normal finding, but most variants such as this get reclassified to being inconsequential. It should not be used to make medical management decisions. With time, we suspect the lab will determine the significance of this  variant, if any. If we do learn more about it, we will try to contact Carolyn Weber to discuss it further. However, it is important to stay in touch with [place] periodically and keep the address and phone number up to date.  ADDITIONAL GENETIC TESTING: We discussed with Carolyn Weber that her genetic testing was fairly extensive.  If there are are genes identified to increase cancer risk that can be analyzed in the future, we would be happy to discuss and coordinate this testing at that time.    CANCER SCREENING RECOMMENDATIONS: Carolyn Weber test result is considered negative (normal).  This means that we have not identified a hereditary cause for her personal and family history of cancer at this time.   While reassuring, this does not definitively rule out a hereditary predisposition to cancer. It is still possible that there could be genetic mutations that are undetectable by current technology, or genetic mutations in genes that have not been tested or  identified to increase cancer risk.  Therefore, it is recommended she continue to follow the cancer management and screening guidelines provided by her oncology and primary healthcare provider. An individual's cancer risk is not determined by genetic test results alone.  Overall cancer risk assessment includes additional factors such as personal medical history, family history, etc.  These should be used to make a personalized plan for cancer prevention and surveillance.    Given Carolyn Weber's personal and family histories, we must consider these negative results carefully.  Families with features suggestive of hereditary risk for cancer tend to have multiple family members with cancer, diagnoses in multiple generations, and diagnoses before the age of 63. Carolyn Weber family exhibits some of these features. Therefore this negative result may actually be due to the limitations of current technology to detect all mutations within these genes, or there may be a different gene  that has not yet been discovered or tested.   RECOMMENDATIONS FOR FAMILY MEMBERS:  Relatives in this family might be at some increased risk of developing cancer, over the general population risk, simply due to the family history of cancer.  We recommended women in this family have a yearly mammogram beginning at age 58, or 58 years younger than the earliest onset of cancer, an annual clinical breast exam, and perform monthly breast self-exams. Women in this family should also have a gynecological exam as recommended by their primary provider. All family members should have a colonoscopy as directed by their doctors.  All family members should inform their physicians about the family history of cancer so their doctors can make the most appropriate screening recommendations for them.   It is also possible there is a hereditary cause for the cancer in Carolyn Weber's family that she did not inherit and therefore was not identified in her.  We recommended brother, niece and maternal relatives have genetic counseling and testing. Carolyn Weber will let us know if we can be of any assistance in coordinating genetic counseling and/or testing for these family members.   FOLLOW-UP: Lastly, we discussed with Carolyn Weber that cancer genetics is a rapidly advancing field and it is possible that new genetic tests will be appropriate for her and/or her family members in the future. We encouraged her to remain in contact with cancer genetics on an annual basis so we can update her personal and family histories and let her know of advances in cancer genetics that may benefit this family.   Our contact number was provided. Ms. Pearcy questions were answered to her satisfaction, and she knows she is welcome to call us at anytime with additional questions or concerns.  Faith Rogue, MS Genetic Counselor [email or web address] Phone: [phone]

## 2018-11-03 ENCOUNTER — Ambulatory Visit: Payer: Medicare Other | Attending: Diagnostic Neuroimaging | Admitting: Physical Therapy

## 2018-11-03 DIAGNOSIS — R2681 Unsteadiness on feet: Secondary | ICD-10-CM | POA: Diagnosis not present

## 2018-11-03 DIAGNOSIS — R2689 Other abnormalities of gait and mobility: Secondary | ICD-10-CM

## 2018-11-03 NOTE — Therapy (Signed)
[facility] [address] [place], [place], [address] Phone: [phone]   Fax:  [phone]  Physical Therapy Evaluation  Patient Details  Name: Carolyn Weber MRN: [id number] Date of Birth: 1940/11/06 Referring Provider (PT): Penumalli   Encounter Date: 11/03/2018  PT End of Session - 11/03/18 1837    Visit Number  1    Number of Visits  5    Date for PT Re-Evaluation  01/02/19    Authorization Type  [employer] Medicare    PT Start Time  1104    PT Stop Time  1159    PT Time Calculation (min)  55 min    Activity Tolerance  Patient tolerated treatment well    Behavior During Therapy  [facility] for tasks assessed/performed       Past Medical History:  Diagnosis Date  . Allergy   . Arthritis   . Cancer ([place]) 10/2018   breast  . Colon polyps   . Diabetes mellitus without complication ([place])   . Diverticulitis   . Family history of breast cancer   . Family history of lung cancer   . Family history of prostate cancer   . Hyperlipemia   . Hypertension   . Obesity     Past Surgical History:  Procedure Laterality Date  . BREAST EXCISIONAL BIOPSY Right    about 30 years ago.   [facility] Kitchen BREAST SURGERY    . calcium removal from breast    . right breast biopsy Right 09/21/2018  . urinary sling      There were no vitals filed for this visit.   Subjective Assessment - 11/03/18 1115    Subjective  On December 1, went to church and was fine.  Got up the next day and couldn't walk without losing my balance.  Went to work anyway, and the next day I saw my breast cancer surgeon, still having loss of balance.  Had MRI, which was inconclusive.  Feel that my walking has improved, but not fully back to normal.  No falls.    Pertinent History  PMH DM, breast cancer Stage IA (to have surgery-lumpectomy- 11/11/18)    Patient Stated Goals  Pt's goal for therapy is to help with balance.    Currently in Pain?  No/denies         [facility] PT  Assessment - 11/03/18 1126      Assessment   Medical Diagnosis  L pontine CVA    Referring Provider (PT)  Penumalli    Onset Date/Surgical Date  10/03/18    Hand Dominance  Right      Precautions   Precautions  Fall    Precaution Comments  to have R breast lumpectomy 11/11/18      Balance Screen   Has the patient fallen in the past 6 months  No    Has the patient had a decrease in activity level because of a fear of falling?   Yes    Is the patient reluctant to leave their home because of a fear of falling?   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    Leisure  Enjoys church activities and Bible study      Observation/Other Assessments   Focus on Therapeutic Outcomes (FOTO)   Staff did not capture      ROM / Strength   AROM / PROM / Strength  Strength      Strength   Overall Strength  Within functional limits for tasks performed      Transfers   Transfers  Sit to Stand;Stand to Sit    Sit to Stand  6: Modified independent (Device/Increase time);With upper extremity assist;From chair/3-in-1    Stand to Sit  6: Modified independent (Device/Increase time);With upper extremity assist;To chair/3-in-1      Ambulation/Gait   Ambulation/Gait  Yes    Ambulation/Gait Assistance  6: Modified independent (Device/Increase time)    Ambulation Distance (Feet)  300 Feet    Assistive device  None    Gait Pattern  Step-through pattern;Wide base of support    Ambulation Surface   Level;Indoor    Gait velocity  11.22 sec = 2.92 ft/sec      Standardized Balance Assessment   Standardized Balance Assessment  Dynamic Gait Index;Timed Up and Go Test      Dynamic Gait Index   Level Surface  Mild Impairment    Change in Gait Speed  Mild Impairment    Gait with Horizontal Head Turns  Mild Impairment   veers to L with L head turn   Gait with Vertical Head Turns  Normal    Gait and Pivot Turn  Normal    Step Over Obstacle  Severe Impairment    Step Around Obstacles  Mild Impairment    Steps  Moderate Impairment   ascends step-to, descends step-through; way she has done it   Total Score  15    DGI comment:  Scores <19/24 indicate increased fall risk      Timed Up and Go Test   Normal TUG (seconds)  13.85    TUG Comments  Scores >13.5 seconds indicate increased fall risk.      High Level Balance   High Level Balance Comments  Pt stands EO and EC on solid surface x 30 seconds.  Stands EO on foam x 30 seconds, stands EC on foam x 5 seconds with posterior lean into PT.  May denote decreased vestibular system use for balance.                Objective measurements completed on examination: See above findings.                   PT Long Term Goals - 11/03/18 1845      PT LONG TERM GOAL #1   Title  Pt will be independent with HEP for improved balance, decreased fall risk.  TARGET 12/02/18 (may be delayed due to pt's upcoming breast surgery)    Time  4    Period  Weeks    Status  New    Target Date  12/02/18      PT LONG TERM GOAL #2   Title  Pt will improve TUG score to less than or equal to 13.5 seconds for decreased fall risk.  Time  4    Period  Weeks    Status  New    Target Date  12/02/18      PT LONG TERM GOAL #3   Title  Pt will improve DGI score to at least 17/24 for decreased fall risk.    Time  4    Period  Weeks    Status  New    Target Date  12/02/18      PT LONG TERM GOAL #4   Title  Pt will verbalize understanding of  fall prevention in home environment.    Time  4    Period  Weeks    Status  New    Target Date  12/02/18             Plan - 11/03/18 1838    Clinical Impression Statement  Pt is a 78 year old female who presents to OP PT following event in early December 2019, where she was off-balance, unsteady with gait, and has decreased stamina for activities.  MD referral states L pontine CVA, but pt reports imaging was inconclusive.  She presents with decreased dynamic balance, unsteady gait, decreased vestibular system use for balance, at fall risk per DGI and TUG scores.  Pt feels she has improved over this past month, but is not yet back to baseline.  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  She would benefit from skilled PT to address the above stated deficits to decrease fall risk and improve functional mobility and independence.    History and Personal Factors relevant to plan of care:  PMH includes HTN, DM, breast cancer (to have R lumpectomy 11/11/18), recent L pontine CVA    Clinical Presentation  Stable    Clinical Presentation due to:  fall risk per TUG and DGI    Clinical Decision Making  Low    Rehab Potential  Good    PT Frequency  1x / week   PT recommends 2x/wk, but pt agrees to 1x/wk for 4 weeks due to fiancial concerns   PT Duration  4 weeks   plus eval   PT Treatment/Interventions  ADLs/Self Care Home Management;DME Instruction;Gait training;Functional mobility training;Therapeutic activities;Therapeutic exercise;Patient/family education;Neuromuscular re-education;Balance training    PT Next Visit Plan  Initiate HEP to include balance and walking program; fall prevention education    Consulted and Agree with Plan of Care  Patient       Patient will benefit from skilled therapeutic intervention in order to improve the following deficits and impairments:  Abnormal gait, Decreased balance, Decreased endurance,  Difficulty walking, Decreased mobility  Visit Diagnosis: Unsteadiness on feet  Other abnormalities of gait and mobility     Problem List Patient Active Problem List   Diagnosis Date Noted  . Genetic testing 11/01/2018  . Family history of breast cancer   . Family history of prostate cancer   . Family history of lung cancer   . Malignant neoplasm of upper-inner quadrant of right breast in female, estrogen receptor positive ([place]) 10/10/2018  . HYPERCHOLESTEROLEMIA, PURE 06/21/2007  . HYPERTENSION, BENIGN ESSENTIAL 06/21/2007  . ALLERGIC RHINITIS, SEASONAL 06/21/2007  . DIVERTICULOSIS, COLON 06/21/2007  . POSTMENOPAUSAL STATUS 06/21/2007  . URINARY INCONTINENCE 06/21/2007  . DIVERTICULITIS, HX OF 06/21/2007  . MAMMOGRAM, ABNORMAL, RIGHT, HX OF 06/21/2007    Adan Beal W. 11/03/2018, 6:49 PM Frazier Butt., PT  [facility] [address]  [place], [place], [address] Phone: [phone]   Fax:  [phone]  Name: LANAIYA LANTRY MRN: [id number] Date of Birth: 1941/05/11

## 2018-11-04 ENCOUNTER — Telehealth: Payer: Self-pay | Admitting: *Deleted

## 2018-11-04 ENCOUNTER — Encounter: Payer: Self-pay | Admitting: *Deleted

## 2018-11-04 NOTE — Pre-Procedure Instructions (Signed)
MAYLING ABER  11/04/2018      [facility] ([place]), [place] [address] [place] [address] Phone: [phone] Fax: [phone]    Your procedure is scheduled on Friday, Jan. 10th   Report to [facility] Admitting at  5:30 AM             (posted surgery time 7:30a - 8:30a)   Call this number if you have problems the morning of surgery:  [phone]   Remember:  Do not eat any foods after midnight.   You may drink clear liquids until 0430am.    Clear liquids allowed are:  Water, Clear Tea, Black Coffee only and Gatorade    Take these medicines the morning of surgery with A SIP OF WATER : Amlodipine, Zyrtec.              You WILL NOT take any medication for diabetes that morning.    Do not wear jewelry, make-up or nail polish.  Do not wear lotions, powders,  perfumes, or deodorant.  Do not shave 48 hours prior to surgery.    Do not bring valuables to the hospital.  [facility] is not responsible for any belongings or valuables.  Contacts, dentures or bridgework may not be worn into surgery.  Leave your suitcase in the car.  After surgery it may be brought to your room.  For patients admitted to the hospital, discharge time will be determined by your treatment team.  Patients discharged the day of surgery will not be allowed to drive home, AND will need someone to stay with you for the first 24 hrs.   Please read over the following fact sheets that you were given. Surgical Site Infection Prevention       [facility]- Preparing For Surgery  Before surgery, you can play an important role. Because skin is not sterile, your skin needs to be as free of germs as possible. You can reduce the number of germs on your skin by washing with CHG (chlorahexidine gluconate) Soap before surgery.  CHG is an antiseptic cleaner which kills germs and bonds with the skin to continue killing germs even after washing.     Oral Hygiene is also important to reduce your risk of infection.    Remember - BRUSH YOUR TEETH THE MORNING OF SURGERY WITH YOUR REGULAR TOOTHPASTE  Please do not use if you have an allergy to CHG or antibacterial soaps. If your skin becomes reddened/irritated stop using the CHG.  Do not shave (including legs and underarms) for at least 48 hours prior to first CHG shower. It is OK to shave your face.  Please follow these instructions carefully.   1. Shower the NIGHT BEFORE SURGERY and the MORNING OF SURGERY with CHG.   2. If you chose to wash your hair, wash your hair first as usual with your normal shampoo.  3. After you shampoo, rinse your hair and body thoroughly to remove the shampoo.  4. Use CHG as you would any other liquid soap. You can apply CHG directly to the skin and wash gently with a scrungie or a clean washcloth.   5. Apply the CHG Soap to your body ONLY FROM THE NECK DOWN.  Do not use on open wounds or open sores. Avoid contact with your eyes, ears, mouth and genitals (private parts). Wash Face and genitals (private parts)  with your normal soap.  6. Wash  thoroughly, paying special attention to the area where your surgery will be performed.  7. Thoroughly rinse your body with warm water from the neck down.  8. DO NOT shower/wash with your normal soap after using and rinsing off the CHG Soap.  9. Pat yourself dry with a CLEAN TOWEL.  10. Wear CLEAN PAJAMAS to bed the night before surgery, wear comfortable clothes the morning of surgery  11. Place CLEAN SHEETS on your bed the night of your first shower and DO NOT SLEEP WITH PETS.    Day of Surgery:  Do not apply any deodorants/lotions.  Please wear clean clothes to the hospital/surgery center.   Remember to brush your teeth WITH YOUR REGULAR TOOTHPASTE.

## 2018-11-04 NOTE — Telephone Encounter (Signed)
Received fax from [facility] re: letter needed prior to surgery due to anti platelet therapy, Plavix. Letter written, signed by Dr [name] and successfully faxed to [facility].

## 2018-11-06 NOTE — H&P (Signed)
Carolyn Weber Location: [facility] Patient #: [id number] DOB: Jun 03, 1941 Divorced / Language: English / Race: Black or African American Female       History of Present Illness    . This is a pleasant, healthy, 78 year old woman. Her daughter is with her throughout the encounter. She is referred by Dr. Purcell Nails at the [facility] for evaluation new breast cancer right breast upper inner quadrant. Ron polite is her PCP.     Her only prior breast problems was a right breast biopsy for calcifications by Dr. Bubba Camp many years ago. She gets yearly mammograms. Recent imaging studies showed category C density. A 9 mm mass was noted in the right breast at the 2:30 position 3 cm from the nipple. 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 ER 95%. PR 90%. HER-2 negative. Ki-67 5%.     Past history significant for BMI 34. Hypertension. Hyperlipidemia. Non-insulin-dependent diabetes mellitus, CK D2. Prior right breast biopsy Family history significant for brest cancer in mother who died of metastatic disease. Mother also had a stroke and diabetes. Father had prostate cancer diabetes and heart disease but did not die from the prostate cancer. 2 maternal aunts had breast cancer and her survivors. 3 maternal cousins had breast cancer. No one has had genetic testing according to the patient and her daughter. Social history reveals that she is divorced. Has a son and a daughter. Denies alcohol or tobacco. Lives in [place]. 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 We talked about surgical management. We talked about lumpectomy and compare that to mastectomy. With her without reconstruction I told her that her case had been discussed last week in breast conference and the consensus recommendations were right breast lumpectomy without sentinel node biopsy, antiestrogen therapy and possible but  less likely radiation therapy. We had a long talk about the implications of a genetic mutation as it relates to our first line relatives and also bring as well as 2 personal recurrence rate. She is interested in genetic testing but does not think it would influence her decision about the extent of surgery Her preference is breast conservation. I think that is reasonable. So long as she is screened annually there is no survival advantage to mastectomy. If she has a genetic mutation of think we would do yearly MRI as well      Plan: Go ahead and schedule right breast lumpectomy with radioactive seed localization. Refer to genetic counseling Refer to medical oncology Refer to radiation oncology postop She sees Ron Polite every 6 months and has an appointment with him on December 17      I discussed the indications, details, techniques, and risk of the surgery in detail with the patient and her daughter. There were the risk of bleeding, infection, cosmetic deformity, chronic pain, reoperation for positive margins, and other unforeseen issues. They understand all these issues. All other questions were answered. They agree with this plan. We had a very lengthy discussion over 1 hour today    Addendum Note Mrs. Carolyn Weber was feeling fatigued and saw Dr. Lajean Manes. Question of TIA was raised He has ordered blood work and MRI I have instructed nursing and scheduling that Dr. [name] will need to give risk assessment and clearance to proceed with her breast cancer surgery  Addendum Note Genetics negative.   Past Surgical History ( Breast Biopsy  Right. multiple Colon  Polyp Removal - Colonoscopy   Diagnostic Studies History  Colonoscopy  1-5 years ago Mammogram  within last year Pap Smear  never  Allergies  No Known Drug Allergies   Medication History  Besivance (0.6% Suspension, Ophthalmic) Active. Imipramine HCl (25MG Tablet, Oral) Active. Pazeo (0.7% Solution,  Ophthalmic) Active. amLODIPine Besylate (10MG Tablet, Oral) Active. Zocor (10MG Tablet, Oral) Active. Tylenol Extra Strength (500MG Tablet, Oral) Active. Oxybutynin (3.9MG/24HR Patch TW, Transdermal) Active. Aspirin (81MG Tablet Chewable, Oral) Active. Cetirizine HCl (10MG Tablet, Oral) Active. Docusate Calcium (240MG Capsule, Oral) Active. Polyethyl Glyc-Propyl Glyc PF (0.4-0.3% Solution, Ophthalmic) Active. Alavert (10MG Tablet, Oral) Active. Calcium-Vitamin D (600MG Tablet Chewable, Oral) Active. Clobetasol Propionate (0.05% Cream, External) Active. Multivitamin Adult (Oral) Active. Medications Reconciled  Pregnancy / Birth History  Age at menarche  89 years. Age of menopause  61-55 Gravida  2 Irregular periods  Maternal age  22-20 Para  3  Other Problems  Arthritis  Bladder Problems  Breast Cancer  Diabetes Mellitus  High blood pressure  Lump In Breast     Review of Systems  General Present- Fatigue. Not Present- Appetite Loss, Chills, Fever, Night Sweats, Weight Gain and Weight Loss. Skin Not Present- Change in Wart/Mole, Dryness, Hives, Jaundice, New Lesions, Non-Healing Wounds, Rash and Ulcer. HEENT Present- Seasonal Allergies and Wears glasses/contact lenses. Not Present- Earache, Hearing Loss, Hoarseness, Nose Bleed, Oral Ulcers, Ringing in the Ears, Sinus Pain, Sore Throat, Visual Disturbances and Yellow Eyes. Respiratory Not Present- Bloody sputum, Chronic Cough, Difficulty Breathing, Snoring and Wheezing. Breast Present- Breast Mass. Not Present- Breast Pain, Nipple Discharge and Skin Changes. Cardiovascular Present- Leg Cramps and Swelling of Extremities. Not Present- Chest Pain, Difficulty Breathing Lying Down, Palpitations, Rapid Heart Rate and Shortness of Breath. Gastrointestinal Present- Constipation. Not Present- Abdominal Pain, Bloating, Bloody Stool, Change in Bowel Habits, Chronic diarrhea, Difficulty Swallowing, Excessive gas,  Gets full quickly at meals, Hemorrhoids, Indigestion, Nausea, Rectal Pain and Vomiting. Female Genitourinary Present- Nocturia. Not Present- Frequency, Painful Urination, Pelvic Pain and Urgency. Musculoskeletal Present- Joint Pain and Swelling of Extremities. Not Present- Back Pain, Joint Stiffness, Muscle Pain and Muscle Weakness. Neurological Present- Tingling, Trouble walking and Weakness. Not Present- Decreased Memory, Fainting, Headaches, Numbness, Seizures and Tremor. Psychiatric Not Present- Anxiety, Bipolar, Change in Sleep Pattern, Depression, Fearful and Frequent crying. Hematology Present- Easy Bruising. Not Present- Blood Thinners, Excessive bleeding, Gland problems, HIV and Persistent Infections.  Vitals  Weight: 203.2 lb Height: 64in Body Surface Area: 1.97 m Body Mass Index: 34.88 kg/m  Temp.: 98.24F(Oral)  Pulse: 80 (Regular)  BP: 162/82 (Sitting, Left Arm, Standard)      Physical Exam  General Mental Status-Alert. General Appearance-Consistent with stated age. Hydration-Well hydrated. Voice-Normal.  Head and Neck Head-normocephalic, atraumatic with no lesions or palpable masses. Trachea-midline. Thyroid Gland Characteristics - normal size and consistency.  Eye Eyeball - Bilateral-Extraocular movements intact. Sclera/Conjunctiva - Bilateral-No scleral icterus.  Chest and Lung Exam Chest and lung exam reveals -quiet, even and easy respiratory effort with no use of accessory muscles and on auscultation, normal breath sounds, no adventitious sounds and normal vocal resonance. Inspection Chest Wall - Normal. Back - normal.  Breast Note: breasts are large. No palpable mass. Needle biopsy scar noted right breast medially. No hematoma. Nipple and areolar complexes looked normal. No axillary adenopathy on either side Transverse scar right breast superiorly.   Cardiovascular Cardiovascular examination reveals -normal heart  sounds, regular rate and rhythm with no murmurs and normal pedal pulses bilaterally.  Abdomen Inspection Inspection of  the abdomen reveals - No Hernias. Skin - Scar - no surgical scars. Palpation/Percussion Palpation and Percussion of the abdomen reveal - Soft, Non Tender, No Rebound tenderness, No Rigidity (guarding) and No hepatosplenomegaly. Auscultation Auscultation of the abdomen reveals - Bowel sounds normal.  Neurologic Neurologic evaluation reveals -alert and oriented x 3 with no impairment of recent or remote memory. Mental Status-Normal.  Musculoskeletal Normal Exam - Left-Upper Extremity Strength Normal and Lower Extremity Strength Normal. Normal Exam - Right-Upper Extremity Strength Normal and Lower Extremity Strength Normal.  Lymphatic Head & Neck  General Head & Neck Lymphatics: Bilateral - Description - Normal. Axillary  General Axillary Region: Bilateral - Description - Normal. Tenderness - Non Tender. Femoral & Inguinal  Generalized Femoral & Inguinal Lymphatics: Bilateral - Description - Normal. Tenderness - Non Tender.    Assessment & Plan  PRIMARY CANCER OF UPPER INNER QUADRANT OF RIGHT FEMALE BREAST (C50.211)   Your recent imaging studies and biopsy show a 9 mm cancer in the right breast, upper inner quadrant This is a low to intermediate grade tumor Estrogen and progesterone receptors are positive. HER-2 test is negative. We gave you a copy of the pathology report  You have a very strong family history of breast cancer but no one has had genetic testing  You will be referred to genetic counseling and possible genetic testing. You have expressed an interest in this you will be referred to a medical oncologist for their consultation regarding nonsurgical types of treatment  Your case has been discussed in our weekly breast cancer conference, the recommendations are right breast lumpectomy, without lymph node surgery and estrogen therapy  possible but likely no radiation therapy if you take antiestrogen pills   We have discussed surgical options which include lumpectomy. We have compare that to mastectomy. There does not appear to be any survival advantage to mastectomy Your preference is lumpectomy and breast conservation I think you are an excellent candidate for that you will be scheduled for surgery you will be scheduled for right breast lumpectomy with radioactive seed localization I have discussed the indications, techniques, and risk of the surgery in detail with you and your daughter I recommend that the surgery occur within 30 days of today  Referred to Oncology, for evaluation and follow up (Oncology). Routine. Referred to Genetic Counseling, for evaluation and follow up [employer]).     FAMILY HISTORY OF BREAST CANCER IN FEMALE (Z80.3) TYPE 2 DIABETES MELLITUS TREATED WITHOUT INSULIN (E11.9) CKD STAGE 2 DUE TO TYPE 2 DIABETES MELLITUS (E11.22) HYPERTENSION, ESSENTIAL, BENIGN (I10) BMI 34.0-34.9,ADULT (Z68.34)    Edsel Petrin. Dalbert Batman, M.D., [facility] General and Minimally invasive Surgery Breast and Colorectal Surgery Office:   [phone] Pager:   [phone]

## 2018-11-07 ENCOUNTER — Inpatient Hospital Stay (HOSPITAL_COMMUNITY)
Admission: RE | Admit: 2018-11-07 | Discharge: 2018-11-07 | Disposition: A | Discharge status: Home / Self Care | Payer: Medicare Other | Source: Ambulatory Visit

## 2018-11-07 NOTE — Progress Notes (Signed)
PCP -  Cardiologist -  Chest x-ray -  EKG -   Stress Test -  ECHO -   Cardiac Cath -   Sleep Study -  CPAP -   Fasting Blood Sugar -  Checks Blood Sugar _____ times a day  Blood Thinner Instructions: Aspirin Instructions:  Anesthesia review:   Patient denies shortness of breath, fever, cough and chest pain at PAT appointment  Patient verbalized understanding of instructions that were given to them at the PAT appointment. Patient was also instructed that they will need to review over the PAT instructions again at home before surgery. 

## 2018-11-08 ENCOUNTER — Other Ambulatory Visit: Payer: Self-pay | Admitting: *Deleted

## 2018-11-08 ENCOUNTER — Telehealth: Payer: Self-pay | Admitting: *Deleted

## 2018-11-08 MED ORDER — LETROZOLE 2.5 MG PO TABS: 2.5000 mg | ORAL_TABLET | Freq: Every day | ORAL | 4 refills | Status: DC

## 2018-11-08 NOTE — Telephone Encounter (Signed)
Called pt and discussed Letrozole prescription per Dr. [name]. Gave instructions and directions. Denies questions at this time.

## 2018-11-08 NOTE — Telephone Encounter (Signed)
Called pt and cx appt with dr. [name] on 1/17. Informed will r/s once sx has been scheduled. Received verbal understanding.

## 2018-11-10 ENCOUNTER — Telehealth: Payer: Self-pay | Admitting: Diagnostic Neuroimaging

## 2018-11-10 NOTE — Telephone Encounter (Signed)
LMVM for pt that returned call.  Dr. [name] did recommend 2nd MRI, pt requested to be set up by oncology Dr. Gladstone Pih.  Call us back with questions.

## 2018-11-10 NOTE — Telephone Encounter (Signed)
Pt states she was told by Dr [name] that a 2nd MRI would be ordered, MRI coordinator confirmed no recent order has been received.  Pt asking for a call if this is no longer going to be done and to know plan of action

## 2018-11-11 ENCOUNTER — Ambulatory Visit (HOSPITAL_COMMUNITY): Admission: RE | Admit: 2018-11-11 | Payer: Medicare Other | Source: Home / Self Care | Admitting: General Surgery

## 2018-11-11 ENCOUNTER — Encounter (HOSPITAL_COMMUNITY): Admission: RE | Payer: Self-pay | Source: Home / Self Care

## 2018-11-11 SURGERY — BREAST LUMPECTOMY WITH RADIOACTIVE SEED LOCALIZATION
Anesthesia: General | Site: Breast | Laterality: Right

## 2018-11-15 ENCOUNTER — Other Ambulatory Visit: Payer: Self-pay | Admitting: *Deleted

## 2018-11-15 DIAGNOSIS — R9089 Other abnormal findings on diagnostic imaging of central nervous system: Secondary | ICD-10-CM

## 2018-11-15 DIAGNOSIS — C50211 Malignant neoplasm of upper-inner quadrant of right female breast: Secondary | ICD-10-CM

## 2018-11-15 DIAGNOSIS — Z170 Estrogen receptor positive status [ER+]: Principal | ICD-10-CM

## 2018-11-17 ENCOUNTER — Other Ambulatory Visit: Payer: Self-pay | Admitting: *Deleted

## 2018-11-18 ENCOUNTER — Ambulatory Visit: Payer: Medicare Other | Admitting: Hematology and Oncology

## 2018-11-23 ENCOUNTER — Ambulatory Visit: Payer: Medicare Other | Admitting: Physical Therapy

## 2018-11-23 ENCOUNTER — Encounter: Payer: Self-pay | Admitting: Physical Therapy

## 2018-11-23 DIAGNOSIS — R2689 Other abnormalities of gait and mobility: Secondary | ICD-10-CM

## 2018-11-23 DIAGNOSIS — R2681 Unsteadiness on feet: Secondary | ICD-10-CM | POA: Diagnosis not present

## 2018-11-23 NOTE — Therapy (Signed)
[facility] [address] [place], [place], [address] Phone: [phone]   Fax:  [phone]  Physical Therapy Treatment  Patient Details  Name: Carolyn Weber MRN: [id number] Date of Birth: 1940-11-12 Referring Provider (PT): Penumalli   Encounter Date: 11/23/2018  PT End of Session - 11/23/18 1016    Visit Number  2    Number of Visits  5    Date for PT Re-Evaluation  01/02/19    Authorization Type  UHC Medicare    PT Start Time  0935    PT Stop Time  1018    PT Time Calculation (min)  43 min    Activity Tolerance  Patient tolerated treatment well    Behavior During Therapy  [facility] for tasks assessed/performed       Past Medical History:  Diagnosis Date  . Allergy   . Arthritis   . Cancer ([place]) 10/2018   breast  . Colon polyps   . Diabetes mellitus without complication ([place])   . Diverticulitis   . Family history of breast cancer   . Family history of lung cancer   . Family history of prostate cancer   . Hyperlipemia   . Hypertension   . Obesity     Past Surgical History:  Procedure Laterality Date  . BREAST EXCISIONAL BIOPSY Right    about 30 years ago.   Marland Kitchen BREAST SURGERY    . calcium removal from breast    . right breast biopsy Right 09/21/2018  . urinary sling      There were no vitals filed for this visit.  Subjective Assessment - 11/23/18 0938    Subjective  Surgery has been postponed until at least March, so I wanted to get started wtih therapy.  MRI scheduled 12/13/2018.  No reported falls.    Pertinent History  PMH DM, breast cancer Stage IA (to have surgery-lumpectomy- 11/11/18)    Patient Stated Goals  Pt's goal for therapy is to help with balance.    Currently in Pain?  No/denies                       [facility] Adult PT Treatment/Exercise - 11/23/18 0001      Exercises   Exercises  Knee/Hip      Knee/Hip Exercises: Aerobic   Stepper  SciFit, Level 1.2, 25-40 RPM, 4  extremities x 8 minutes, for flexibility and strength.         NMR for balance training:  Balance Exercises - 11/23/18 0940      Balance Exercises: Standing   Standing Eyes Opened  Wide (BOA);Head turns;Solid surface;5 reps;Narrow base of support (BOS)   Head nods   Standing Eyes Closed  Wide (BOA);Narrow base of support (BOS);Solid surface;5 reps;Head turns   On foam, wide BOS only, head turns, nods EC   Tandem Stance  Eyes open;Intermittent upper extremity support;3 reps;30 secs    Tandem Gait  Forward;Upper extremity support;4 reps    Sidestepping  Upper extremity support;2 reps   R and L along counter   Heel Raises Limitations  x 10    Toe Raise Limitations  x 10        PT Education - 11/23/18 1016    Education Details  HEP initiated-see instructions, information on [facility] on Channel 13    Person(s) Educated  Patient    Methods  Explanation;Demonstration;Handout    Comprehension  Verbalized understanding;Returned demonstration;Verbal cues required  PT Long Term Goals - 11/03/18 1845      PT LONG TERM GOAL #1   Title  Pt will be independent with HEP for improved balance, decreased fall risk.  TARGET 12/02/18 (may be delayed due to pt's upcoming breast surgery)    Time  4    Period  Weeks    Status  New    Target Date  12/02/18      PT LONG TERM GOAL #2   Title  Pt will improve TUG score to less than or equal to 13.5 seconds for decreased fall risk.    Time  4    Period  Weeks    Status  New    Target Date  12/02/18      PT LONG TERM GOAL #3   Title  Pt will improve DGI score to at least 17/24 for decreased fall risk.    Time  4    Period  Weeks    Status  New    Target Date  12/02/18      PT LONG TERM GOAL #4   Title  Pt will verbalize understanding of fall prevention in home environment.    Time  4    Period  Weeks    Status  New    Target Date  12/02/18            Plan - 11/23/18 1024    Clinical Impression Statement  Pt returns to  OPPT today, and HEP initiated for balance activities.  Pt requires intermittent assistance for dynamci balance exercises at counter, wtih more difficulty tandem stancing LLE in posterior positiong.  With corner balance exercises head motions and EC, pt has increased sway, particularly with EC.  Pt will continue to benefit from skilled PT to address balance and gait for improved functional mobility.    Rehab Potential  Good    PT Frequency  1x / week   PT recommends 2x/wk, but pt agrees to 1x/wk for 4 weeks due to fiancial concerns   PT Duration  4 weeks   plus eval   PT Treatment/Interventions  ADLs/Self Care Home Management;DME Instruction;Gait training;Functional mobility training;Therapeutic activities;Therapeutic exercise;Patient/family education;Neuromuscular re-education;Balance training    PT Next Visit Plan  Review HEP to include balance, ask about what pt is doing for walking as exercise; fall prevention education    Consulted and Agree with Plan of Care  Patient       Patient will benefit from skilled therapeutic intervention in order to improve the following deficits and impairments:  Abnormal gait, Decreased balance, Decreased endurance, Difficulty walking, Decreased mobility  Visit Diagnosis: Unsteadiness on feet  Other abnormalities of gait and mobility     Problem List Patient Active Problem List   Diagnosis Date Noted  . Genetic testing 11/01/2018  . Family history of breast cancer   . Family history of prostate cancer   . Family history of lung cancer   . Malignant neoplasm of upper-inner quadrant of right breast in female, estrogen receptor positive ([place]) 10/10/2018  . HYPERCHOLESTEROLEMIA, PURE 06/21/2007  . HYPERTENSION, BENIGN ESSENTIAL 06/21/2007  . ALLERGIC RHINITIS, SEASONAL 06/21/2007  . DIVERTICULOSIS, COLON 06/21/2007  . POSTMENOPAUSAL STATUS 06/21/2007  . URINARY INCONTINENCE 06/21/2007  . DIVERTICULITIS, HX OF 06/21/2007  . MAMMOGRAM, ABNORMAL, RIGHT,  HX OF 06/21/2007    Kadejah Sandiford W. 11/23/2018, 10:28 AM Mady Haagensen, PT 11/23/18 10:29 AM Phone: [phone] Fax: [facility] [address]  [place], [place], [address] Phone: [phone]   Fax:  [phone]  Name: Carolyn Weber MRN: [id number] Date of Birth: [birth year]

## 2018-11-23 NOTE — Patient Instructions (Addendum)
   For the first 4 exercises, stand in a corner and have a chair in front of you.  Feet Apart, Head Motion - Eyes Open    With eyes open, feet apart, move head slowly: up and down, 5 times.  Then move head side to side 5 times. Do _1-2___ sessions per day.  Copyright  VHI. All rights reserved.  Feet Apart, Head Motion - Eyes Closed    With eyes closed and feet shoulder width apart, move head slowly, up and down 5 times.  Then move your head side to side 5 times.  Do _1-2___ sessions per day.  Copyright  VHI. All rights reserved.  Feet Apart (Compliant Surface) Head Motion - Eyes Open    With eyes open, standing on compliant surface: ____pillow or towel____, feet shoulder width apart, move head slowly: up and down 5 times.  Then side to side 5 times.   Do __1-2__ sessions per day.  Copyright  VHI. All rights reserved.  Feet Apart (Compliant Surface) Head Motion - Eyes Closed    Stand on compliant surface: ___pillow or towel_____ with feet shoulder width apart. Close eyes and move head slowly, up and down 5 times.  Then move head side to side 5 times.   Do ___1-2_ sessions per day.  Copyright  VHI. All rights reserved.  Tandem Stance    Right foot in front of left, heel touching toe both feet "straight ahead". Balance in this position _15__ seconds. Do with left foot in front of right.  Repeat 3 times each position, hold onto counter if needed.  Copyright  VHI. All rights reserved.  Tandem Walking    At your counter, walk with each foot directly in front of other, heel of one foot touching toes of other foot with each step. Both feet straight ahead.  Repeat 2-3 lengths of the counter.  Look straight ahead.   Copyright  VHI. All rights reserved.    [facility] EXERCISES, CHANNEL 13, MONDAYS-FRIDAYS 8 AM AND 1 PM

## 2018-11-29 ENCOUNTER — Encounter: Payer: Self-pay | Admitting: Physical Therapy

## 2018-11-29 ENCOUNTER — Ambulatory Visit: Payer: Medicare Other | Admitting: Physical Therapy

## 2018-11-29 DIAGNOSIS — R2681 Unsteadiness on feet: Secondary | ICD-10-CM

## 2018-11-29 DIAGNOSIS — R2689 Other abnormalities of gait and mobility: Secondary | ICD-10-CM

## 2018-11-29 NOTE — Patient Instructions (Addendum)
Feet Together (Compliant Surface) Head Motion - Eyes Open    With eyes open, standing on compliant surface: ___pillow or towel_____, feet together, move head slowly: up and down 5 times.  Then side to side head motion, 5 times. Do _1___ session per day.  Copyright  VHI. All rights reserved.  Feet Together (Compliant Surface) Head Motion - Eyes Closed    Stand on compliant surface: __pillow or towel______ with feet together. Close eyes and move head slowly, up and down 5 times.  Then move head slowly side to side 5 times. Do __1__ session per day.  Copyright  VHI. All rights reserved.  Toe / Heel Raise    Gently rock back on heels and raise toes. Then rock forward on toes and raise heels. Repeat sequence _2 sets of 10___ times per session. Do __1__ session per day.  Copyright  VHI. All rights reserved.  Side-Stepping    Walk to left side with eyes open. Take even steps, leading with same foot. Make sure each foot lifts off the floor. Repeat in opposite direction, 3 times the length of the counter. Do __1__ session per day.   Copyright  VHI. All rights reserved.  Side to Side Head Motion    Walk along the counter, turning your head slowly side to side, 3 reps the length of the counter.  Perform 1 time per day. Copyright  VHI. All rights reserved.

## 2018-11-29 NOTE — Therapy (Signed)
[facility] [address] [place], [place], [address] Phone: [phone]   Fax:  [phone]  Physical Therapy Treatment  Patient Details  Name: Carolyn Weber MRN: [id number] Date of Birth: 1941-04-01 Referring Provider (PT): Penumalli   Encounter Date: 11/29/2018  PT End of Session - 11/29/18 1301    Visit Number  3    Number of Visits  5    Date for PT Re-Evaluation  01/02/19    Authorization Type  UHC Medicare    PT Start Time  0932    PT Stop Time  1016    PT Time Calculation (min)  44 min    Activity Tolerance  Patient tolerated treatment well    Behavior During Therapy  [facility] for tasks assessed/performed       Past Medical History:  Diagnosis Date  . Allergy   . Arthritis   . Cancer ([place]) 10/2018   breast  . Colon polyps   . Diabetes mellitus without complication ([place])   . Diverticulitis   . Family history of breast cancer   . Family history of lung cancer   . Family history of prostate cancer   . Hyperlipemia   . Hypertension   . Obesity     Past Surgical History:  Procedure Laterality Date  . BREAST EXCISIONAL BIOPSY Right    about 30 years ago.   Marland Kitchen BREAST SURGERY    . calcium removal from breast    . right breast biopsy Right 09/21/2018  . urinary sling      There were no vitals filed for this visit.  Subjective Assessment - 11/29/18 0933    Subjective  Been doing the exercises and looked at the [facility] exercises on the TV.    Pertinent History  PMH DM, breast cancer Stage IA (to have surgery-lumpectomy- 11/11/18)    Patient Stated Goals  Pt's goal for therapy is to help with balance.    Currently in Pain?  No/denies                            Balance Exercises - 11/29/18 0942      Balance Exercises: Standing   Standing Eyes Opened  Wide (BOA);Head turns;Solid surface;Narrow base of support (BOS);Foam/compliant surface   Head nods, 10 reps   Standing Eyes Closed   Wide (BOA);Narrow base of support (BOS);Solid surface;5 reps;Head turns;Foam/compliant surface   Head nods   Tandem Stance  Eyes open;Intermittent upper extremity support;15 secs;1 rep    Gait with Head Turns  Forward;Intermittent upper extremity support;2 reps   then 2 reps with head nods   Tandem Gait  Forward;Upper extremity support;4 reps    Sidestepping  2 reps   R and L along counter   Marching Limitations  Marching in place x 10 reps, then forward marching 10 ft x 4 reps    Heel Raises Limitations  x 10 reps, 2 sets     Toe Raise Limitations  x 10 reps, 2 sets     Other Standing Exercises  Alternating hip kicks side, x 10 reps, then back x 10 reps with UE support; cues for controlled, tall trunk        PT Education - 11/29/18 1300    Education Details  Updates/additions to HEP; instructed to d/c standing corner exercise feet apart/solid surface (too easy)    Person(s) Educated  Patient    Methods  Explanation;Demonstration;Handout  Comprehension  Verbalized understanding;Returned demonstration;Verbal cues required          PT Long Term Goals - 11/03/18 1845      PT LONG TERM GOAL #1   Title  Pt will be independent with HEP for improved balance, decreased fall risk.  TARGET 12/02/18 (may be delayed due to pt's upcoming breast surgery)    Time  4    Period  Weeks    Status  New    Target Date  12/02/18      PT LONG TERM GOAL #2   Title  Pt will improve TUG score to less than or equal to 13.5 seconds for decreased fall risk.    Time  4    Period  Weeks    Status  New    Target Date  12/02/18      PT LONG TERM GOAL #3   Title  Pt will improve DGI score to at least 17/24 for decreased fall risk.    Time  4    Period  Weeks    Status  New    Target Date  12/02/18      PT LONG TERM GOAL #4   Title  Pt will verbalize understanding of fall prevention in home environment.    Time  4    Period  Weeks    Status  New    Target Date  12/02/18            Plan  - 11/29/18 1301    Clinical Impression Statement  [facility] PT session today focused on review of HEP, with pt return demo understanding, with progression of balance exercises in corner and at counter.  With narrowed BOS on foam surface, pt has initial unsteadiness with head motions and EC, requiring intermittent UE support.  Pt continues to be very motivated for exercises and progressing to community activities; she will continue to benefit from skilled PT to address balance and gait for functional mobility.    Rehab Potential  Good    PT Frequency  1x / week   PT recommends 2x/wk, but pt agrees to 1x/wk for 4 weeks due to fiancial concerns   PT Duration  4 weeks   plus eval   PT Treatment/Interventions  ADLs/Self Care Home Management;DME Instruction;Gait training;Functional mobility training;Therapeutic activities;Therapeutic exercise;Patient/family education;Neuromuscular re-education;Balance training    PT Next Visit Plan  Review HEP and continue balance and functional strengthening; ask about walking for exercise at home; fall prevention education    Consulted and Agree with Plan of Care  Patient       Patient will benefit from skilled therapeutic intervention in order to improve the following deficits and impairments:  Abnormal gait, Decreased balance, Decreased endurance, Difficulty walking, Decreased mobility  Visit Diagnosis: Unsteadiness on feet  Other abnormalities of gait and mobility     Problem List Patient Active Problem List   Diagnosis Date Noted  . Genetic testing 11/01/2018  . Family history of breast cancer   . Family history of prostate cancer   . Family history of lung cancer   . Malignant neoplasm of upper-inner quadrant of right breast in female, estrogen receptor positive ([place]) 10/10/2018  . HYPERCHOLESTEROLEMIA, PURE 06/21/2007  . HYPERTENSION, BENIGN ESSENTIAL 06/21/2007  . ALLERGIC RHINITIS, SEASONAL 06/21/2007  . DIVERTICULOSIS, COLON 06/21/2007  .  POSTMENOPAUSAL STATUS 06/21/2007  . URINARY INCONTINENCE 06/21/2007  . DIVERTICULITIS, HX OF 06/21/2007  . MAMMOGRAM, ABNORMAL, RIGHT, HX OF 06/21/2007    [name] W. 11/29/2018,  1:05 PM  Frazier Butt PT  [facility] [address] [place], [place], [address] Phone: [phone]   Fax:  [phone]  Name: Carolyn Weber MRN: [id number] Date of Birth: 1941-08-03

## 2018-12-05 ENCOUNTER — Ambulatory Visit: Payer: Medicare Other | Attending: Diagnostic Neuroimaging | Admitting: Physical Therapy

## 2018-12-05 ENCOUNTER — Encounter: Payer: Self-pay | Admitting: Physical Therapy

## 2018-12-05 DIAGNOSIS — R2689 Other abnormalities of gait and mobility: Secondary | ICD-10-CM | POA: Insufficient documentation

## 2018-12-05 DIAGNOSIS — R2681 Unsteadiness on feet: Secondary | ICD-10-CM

## 2018-12-05 NOTE — Therapy (Signed)
[facility] [address] [place], [place], [address] Phone: [phone]   Fax:  [phone]  Physical Therapy Treatment  Patient Details  Name: Carolyn Weber MRN: [id number] Date of Birth: 1941-08-21 Referring Provider (PT): Penumalli   Encounter Date: 12/05/2018  PT End of Session - 12/05/18 1953    Visit Number  4    Number of Visits  5    Date for PT Re-Evaluation  01/02/19    Authorization Type  UHC Medicare    PT Start Time  1404    PT Stop Time  1445    PT Time Calculation (min)  41 min    Activity Tolerance  Patient tolerated treatment well    Behavior During Therapy  [facility] for tasks assessed/performed       Past Medical History:  Diagnosis Date  . Allergy   . Arthritis   . Cancer ([place]) 10/2018   breast  . Colon polyps   . Diabetes mellitus without complication ([place])   . Diverticulitis   . Family history of breast cancer   . Family history of lung cancer   . Family history of prostate cancer   . Hyperlipemia   . Hypertension   . Obesity     Past Surgical History:  Procedure Laterality Date  . BREAST EXCISIONAL BIOPSY Right    about 30 years ago.   Marland Kitchen BREAST SURGERY    . calcium removal from breast    . right breast biopsy Right 09/21/2018  . urinary sling      There were no vitals filed for this visit.  Subjective Assessment - 12/05/18 1406    Subjective  No changes, no pain.  Been doing a lot of cleaning up in the house from the work that is being done in the house.    Pertinent History  PMH DM, breast cancer Stage IA (to have surgery-lumpectomy- 11/11/18)    Patient Stated Goals  Pt's goal for therapy is to help with balance.    Currently in Pain?  No/denies                       [facility] Adult PT Treatment/Exercise - 12/05/18 0001      Transfers   Transfers  Sit to Stand;Stand to Sit    Sit to Stand  6: Modified independent (Device/Increase time);With upper extremity  assist;From chair/3-in-1    Stand to Sit  6: Modified independent (Device/Increase time);With upper extremity assist;To chair/3-in-1    Number of Reps  2 sets;Other reps (comment)   5 reps, with UE support, then no UE support     Ambulation/Gait   Ambulation/Gait  Yes    Ambulation/Gait Assistance  6: Modified independent (Device/Increase time)    Ambulation Distance (Feet)  400 Feet   100 ft x 2   Assistive device  None    Gait Pattern  Step-through pattern;Wide base of support    Ambulation Surface  Level;Indoor    Gait Comments  Gait with environmental scanning, including head turns, head nods to look for objects.  No LOB noted, but pt reports beoming fatigued after gait.          Balance Exercises - 12/05/18 1425      Balance Exercises: Standing   Standing Eyes Opened  Narrow base of support (BOS);Foam/compliant surface;Head turns;Other reps (comment)   head nods; 10 reps   Standing Eyes Closed  Narrow base of support (BOS);Foam/compliant surface;Head turns;Other reps (  comment)   Head nods, 10 reps   SLS with Vectors  Solid surface;Upper extremity assist 1;Other reps (comment)   10 reps alternating step taps to 6", 12" steps   Step Ups  Forward;6 inch;UE support 2   10 reps   Gait with Head Turns  Forward;Intermittent upper extremity support;2 reps   Head nods    Retro Gait  Upper extremity support;3 reps   Forward/back walking at counter; intermittent UE support   Sidestepping  2 reps    Other Standing Exercises  Review of HEP given last visit, with pt return demo understanding.             PT Long Term Goals - 11/03/18 1845      PT LONG TERM GOAL #1   Title  Pt will be independent with HEP for improved balance, decreased fall risk.  TARGET 12/02/18 (may be delayed due to pt's upcoming breast surgery)    Time  4    Period  Weeks    Status  New    Target Date  12/02/18      PT LONG TERM GOAL #2   Title  Pt will improve TUG score to less than or equal to 13.5  seconds for decreased fall risk.    Time  4    Period  Weeks    Status  New    Target Date  12/02/18      PT LONG TERM GOAL #3   Title  Pt will improve DGI score to at least 17/24 for decreased fall risk.    Time  4    Period  Weeks    Status  New    Target Date  12/02/18      PT LONG TERM GOAL #4   Title  Pt will verbalize understanding of fall prevention in home environment.    Time  4    Period  Weeks    Status  New    Target Date  12/02/18            Plan - 12/05/18 1954    Clinical Impression Statement  Reviewed HEP given last week, with pt return demo understanding of HEP.  Pt verbalizes walking program almost daily around apartment complex.  She continues to require UE support for SLS and compliant surface activiities.  Discussed likely plan for discharge next visit.    Rehab Potential  Good    PT Frequency  1x / week   PT recommends 2x/wk, but pt agrees to 1x/wk for 4 weeks due to fiancial concerns   PT Duration  4 weeks   plus eval   PT Treatment/Interventions  ADLs/Self Care Home Management;DME Instruction;Gait training;Functional mobility training;Therapeutic activities;Therapeutic exercise;Patient/family education;Neuromuscular re-education;Balance training    PT Next Visit Plan  Fall prevention education; plan to check LTGs and likely discharge next visit (she will have seen MD/had MRI, so she may discuss)    Consulted and Agree with Plan of Care  Patient       Patient will benefit from skilled therapeutic intervention in order to improve the following deficits and impairments:  Abnormal gait, Decreased balance, Decreased endurance, Difficulty walking, Decreased mobility  Visit Diagnosis: Unsteadiness on feet  Other abnormalities of gait and mobility     Problem List Patient Active Problem List   Diagnosis Date Noted  . Genetic testing 11/01/2018  . Family history of breast cancer   . Family history of prostate cancer   . Family history of lung  cancer   . Malignant neoplasm of upper-inner quadrant of right breast in female, estrogen receptor positive ([place]) 10/10/2018  . HYPERCHOLESTEROLEMIA, PURE 06/21/2007  . HYPERTENSION, BENIGN ESSENTIAL 06/21/2007  . ALLERGIC RHINITIS, SEASONAL 06/21/2007  . DIVERTICULOSIS, COLON 06/21/2007  . POSTMENOPAUSAL STATUS 06/21/2007  . URINARY INCONTINENCE 06/21/2007  . DIVERTICULITIS, HX OF 06/21/2007  . MAMMOGRAM, ABNORMAL, RIGHT, HX OF 06/21/2007    Aitan Rossbach W. 12/05/2018, 7:58 PM  Frazier Butt., PT   [facility] [address] [place], [place], [address] Phone: [phone]   Fax:  [phone]  Name: Carolyn Weber MRN: [id number] Date of Birth: [birth year]

## 2018-12-13 ENCOUNTER — Ambulatory Visit (HOSPITAL_COMMUNITY)
Admission: RE | Admit: 2018-12-13 | Discharge: 2018-12-13 | Disposition: A | Discharge status: Home / Self Care | Payer: Medicare Other | Source: Ambulatory Visit | Attending: Hematology and Oncology | Admitting: Hematology and Oncology

## 2018-12-13 ENCOUNTER — Other Ambulatory Visit: Payer: Self-pay | Admitting: General Surgery

## 2018-12-13 DIAGNOSIS — C50211 Malignant neoplasm of upper-inner quadrant of right female breast: Secondary | ICD-10-CM

## 2018-12-13 DIAGNOSIS — Z170 Estrogen receptor positive status [ER+]: Principal | ICD-10-CM

## 2018-12-13 DIAGNOSIS — R9089 Other abnormal findings on diagnostic imaging of central nervous system: Secondary | ICD-10-CM | POA: Diagnosis present

## 2018-12-13 LAB — POCT I-STAT CREATININE: Creatinine, Ser: 1.2 mg/dL — ABNORMAL HIGH (ref 0.44–1.00)

## 2018-12-13 MED ORDER — GADOBUTROL 1 MMOL/ML IV SOLN
10.0000 mL | Freq: Once | INTRAVENOUS | Status: AC | PRN
  Administered 2018-12-13: 10 mL via INTRAVENOUS

## 2018-12-15 ENCOUNTER — Telehealth: Payer: Self-pay | Admitting: Hematology and Oncology

## 2018-12-15 NOTE — Telephone Encounter (Signed)
Pt is aware of appt per 2/12 sch message -

## 2018-12-16 ENCOUNTER — Encounter: Payer: Self-pay | Admitting: Physical Therapy

## 2018-12-16 ENCOUNTER — Ambulatory Visit: Payer: Medicare Other | Admitting: Physical Therapy

## 2018-12-16 DIAGNOSIS — R2681 Unsteadiness on feet: Secondary | ICD-10-CM | POA: Diagnosis not present

## 2018-12-16 DIAGNOSIS — R2689 Other abnormalities of gait and mobility: Secondary | ICD-10-CM

## 2018-12-16 NOTE — Patient Instructions (Signed)
Fall Prevention in the Home, Adult Falls can cause injuries and can affect people from all age groups. There are many simple things that you can do to make your home safe and to help prevent falls. Ask for help when making these changes, if needed. What actions can I take to prevent falls? General instructions  Use good lighting in all rooms. Replace any light bulbs that burn out.  Turn on lights if it is dark. Use night-lights.  Place frequently used items in easy-to-reach places. Lower the shelves around your home if necessary.  Set up furniture so that there are clear paths around it. Avoid moving your furniture around.  Remove throw rugs and other tripping hazards from the floor.  Avoid walking on wet floors.  Fix any uneven floor surfaces.  Add color or contrast paint or tape to grab bars and handrails in your home. Place contrasting color strips on the first and last steps of stairways.  When you use a stepladder, make sure that it is completely opened and that the sides are firmly locked. Have someone hold the ladder while you are using it. Do not climb a closed stepladder.  Be aware of any and all pets. What can I do in the bathroom?      Keep the floor dry. Immediately clean up any water that spills onto the floor.  Remove soap buildup in the tub or shower on a regular basis.  Use non-skid mats or decals on the floor of the tub or shower.  Attach bath mats securely with double-sided, non-slip rug tape.  If you need to sit down while you are in the shower, use a plastic, non-slip stool.  Install grab bars by the toilet and in the tub and shower. Do not use towel bars as grab bars. What can I do in the bedroom?  Make sure that a bedside light is easy to reach.  Do not use oversized bedding that drapes onto the floor.  Have a firm chair that has side arms to use for getting dressed. What can I do in the kitchen?  Clean up any spills right away.  If you need to  reach for something above you, use a sturdy step stool that has a grab bar.  Keep electrical cables out of the way.  Do not use floor polish or wax that makes floors slippery. If you must use wax, make sure that it is non-skid floor wax. What can I do in the stairways?  Do not leave any items on the stairs.  Make sure that you have a light switch at the top of the stairs and the bottom of the stairs. Have them installed if you do not have them.  Make sure that there are handrails on both sides of the stairs. Fix handrails that are broken or loose. Make sure that handrails are as long as the stairways.  Install non-slip stair treads on all stairs in your home.  Avoid having throw rugs at the top or bottom of stairways, or secure the rugs with carpet tape to prevent them from moving.  Choose a carpet design that does not hide the edge of steps on the stairway.  Check any carpeting to make sure that it is firmly attached to the stairs. Fix any carpet that is loose or worn. What can I do on the outside of my home?  Use bright outdoor lighting.  Regularly repair the edges of walkways and driveways and fix any cracks.    Remove high doorway thresholds.  Trim any shrubbery on the main path into your home.  Regularly check that handrails are securely fastened and in good repair. Both sides of any steps should have handrails.  Install guardrails along the edges of any raised decks or porches.  Clear walkways of debris and clutter, including tools and rocks.  Have leaves, snow, and ice cleared regularly.  Use sand or salt on walkways during winter months.  In the garage, clean up any spills right away, including grease or oil spills. What other actions can I take?  Wear closed-toe shoes that fit well and support your feet. Wear shoes that have rubber soles or low heels.  Use mobility aids as needed, such as canes, walkers, scooters, and crutches.  Review your medicines with your  health care provider. Some medicines can cause dizziness or changes in blood pressure, which increase your risk of falling. Talk with your health care provider about other ways that you can decrease your risk of falls. This may include working with a physical therapist or trainer to improve your strength, balance, and endurance. Where to find more information  Centers for Disease Control and Prevention, STEADI: WebmailGuide.co.za  [employer] on Aging: BrainJudge.co.uk Contact a health care provider if:  You are afraid of falling at home.  You feel weak, drowsy, or dizzy at home.  You fall at home. Summary  There are many simple things that you can do to make your home safe and to help prevent falls.  Ways to make your home safe include removing tripping hazards and installing grab bars in the bathroom.  Ask for help when making these changes in your home. This information is not intended to replace advice given to you by your health care provider. Make sure you discuss any questions you have with your health care provider. Document Released: 10/09/2002 Document Revised: 06/03/2017 Document Reviewed: 06/03/2017 Elsevier Interactive Patient Education  [employer].  It is important to avoid accidents which may result in broken bones.  Here are a few ideas on how to make your home safer so you will be less likely to trip or fall.  1. Use nonskid mats or non slip strips in your shower or tub, on your bathroom floor and around sinks.  If you know that you have spilled water, wipe it up! 2. In the bathroom, it is important to have properly installed grab bars on the walls or on the edge of the tub.  Towel racks are NOT strong enough for you to hold onto or to pull on for support. 3. Stairs and hallways should have enough light.  Add lamps or night lights if you need ore light. 4. It is good to have handrails on both sides of the stairs if possible.  Always fix broken  handrails right away. 5. It is important to see the edges of steps.  Paint the edges of outdoor steps white so you can see them better.  Put colored tape on the edge of inside steps. 6. Throw-rugs are dangerous because they can slide.  Removing the rugs is the best idea, but if they must stay, add adhesive carpet tape to prevent slipping. 7. Do not keep things on stairs or in the halls.  Remove small furniture that blocks the halls as it may cause you to trip.  Keep telephone and electrical cords out of the way where you walk. 8. Always were sturdy, rubber-soled shoes for good support.  Never wear just  socks, especially on the stairs.  Socks may cause you to slip or fall.  Do not wear full-length housecoats as you can easily trip on the bottom.  9. Place the things you use the most on the shelves that are the easiest to reach.  If you use a stepstool, make sure it is in good condition.  If you feel unsteady, DO NOT climb, ask for help. 10. If a health professional advises you to use a cane or walker, do not be ashamed.  These items can keep you from falling and breaking your bones.

## 2018-12-16 NOTE — Therapy (Signed)
[facility] [address] [place], [place], [address] Phone: [phone]   Fax:  [phone]  Physical Therapy Treatment  Patient Details  Name: Carolyn Weber MRN: [id number] Date of Birth: Sep 21, 1941 Referring Provider (PT): Pen[place]   Encounter Date: 12/16/2018  PT End of Session - 12/16/18 1601    Visit Number  5    Number of Visits  5    Date for PT Re-Evaluation  01/02/19    Authorization Type  [employer] Medicare    PT Start Time  1318    PT Stop Time  1400    PT Time Calculation (min)  42 min    Activity Tolerance  Patient tolerated treatment well    Behavior During Therapy  [facility] for tasks assessed/performed       Past Medical History:  Diagnosis Date  . Allergy   . Arthritis   . Cancer ([place]) 10/2018   breast  . Colon polyps   . Diabetes mellitus without complication ([place])   . Diverticulitis   . Family history of breast cancer   . Family history of lung cancer   . Family history of prostate cancer   . Hyperlipemia   . Hypertension   . Obesity     Past Surgical History:  Procedure Laterality Date  . BREAST EXCISIONAL BIOPSY Right    about 30 years ago.   [facility] BREAST SURGERY    . calcium removal from breast    . right breast biopsy Right 09/21/2018  . urinary sling      There were no vitals filed for this visit.  Subjective Assessment - 12/16/18 1322    Subjective  Denies falls.  Still does not know results of MRI.  Surgery for breast cancer scheduled 01/06/19.  Feels ready for discharge.  Wants to be able to return to work.    Pertinent History  PMH DM, breast cancer Stage IA (to have surgery-lumpectomy- 11/11/18)    Patient Stated Goals  Pt's goal for therapy is to help with balance.    Currently in Pain?  No/denies         [facility] Adult PT Treatment/Exercise - 12/16/18 0001      Transfers   Transfers  Sit to Stand;Stand to Sit    Sit to Stand  7: Independent    Stand to Sit  7: Independent    Comments  without UE support      Ambulation/Gait   Ambulation/Gait  Yes    Ambulation/Gait Assistance  7: Independent    Assistive device  None    Gait Pattern  Step-through pattern;Wide base of support    Ambulation Surface  Level;Indoor    Gait velocity  10.03 sec=3.27 ft/sec      Standardized Balance Assessment   Standardized Balance Assessment  Timed Up and Go Test;Dynamic Gait Index      Dynamic Gait Index   Level Surface  Normal    Change in Gait Speed  Normal    Gait with Horizontal Head Turns  Normal    Gait with Vertical Head Turns  Normal    Gait and Pivot Turn  Normal    Step Over Obstacle  Mild Impairment    Step Around Obstacles  Normal    Steps  Mild Impairment    Total Score  22      Timed Up and Go Test   TUG  Normal TUG    Normal TUG (seconds)  10  PT Long Term Goals - 12/16/18 1359      PT LONG TERM GOAL #1   Title  Pt will be independent with HEP for improved balance, decreased fall risk.  TARGET 12/02/18 (may be delayed due to pt's upcoming breast surgery)    Time  4    Period  Weeks    Status  Achieved      PT LONG TERM GOAL #2   Title  Pt will improve TUG score to less than or equal to 13.5 seconds for decreased fall risk.    Baseline  12/16/18-10.0 seconds    Time  4    Period  Weeks    Status  Achieved      PT LONG TERM GOAL #3   Title  Pt will improve DGI score to at least 17/24 for decreased fall risk.    Baseline  12/16/18-22/24    Time  4    Period  Weeks    Status  Achieved      PT LONG TERM GOAL #4   Title  Pt will verbalize understanding of fall prevention in home environment.    Time  4    Period  Weeks    Status  Achieved            Plan - 12/16/18 1602    Clinical Impression Statement  Pt met all LTG's and feels prepared for discharge from PT.  States no further questions or concerns for PT.  Discharge from PT per Mady Haagensen, PT.    Rehab Potential  Good    PT Frequency  1x / week   PT recommends 2x/wk, but  pt agrees to 1x/wk for 4 weeks due to fiancial concerns   PT Duration  4 weeks   plus eval   PT Treatment/Interventions  ADLs/Self Care Home Management;DME Instruction;Gait training;Functional mobility training;Therapeutic activities;Therapeutic exercise;Patient/family education;Neuromuscular re-education;Balance training    PT Next Visit Plan  Discharge from PT.    Consulted and Agree with Plan of Care  Patient       Patient will benefit from skilled therapeutic intervention in order to improve the following deficits and impairments:  Abnormal gait, Decreased balance, Decreased endurance, Difficulty walking, Decreased mobility  Visit Diagnosis: Unsteadiness on feet  Other abnormalities of gait and mobility     Problem List Patient Active Problem List   Diagnosis Date Noted  . Genetic testing 11/01/2018  . Family history of breast cancer   . Family history of prostate cancer   . Family history of lung cancer   . Malignant neoplasm of upper-inner quadrant of right breast in female, estrogen receptor positive ([place]) 10/10/2018  . HYPERCHOLESTEROLEMIA, PURE 06/21/2007  . HYPERTENSION, BENIGN ESSENTIAL 06/21/2007  . ALLERGIC RHINITIS, SEASONAL 06/21/2007  . DIVERTICULOSIS, COLON 06/21/2007  . POSTMENOPAUSAL STATUS 06/21/2007  . URINARY INCONTINENCE 06/21/2007  . DIVERTICULITIS, HX OF 06/21/2007  . MAMMOGRAM, ABNORMAL, RIGHT, HX OF 06/21/2007    Narda Bonds, PTA [place] 12/16/18 4:04 PM Phone: [phone] Fax: [facility] [address] [place], [place], [address] Phone: [phone]   Fax:  [phone]  Name: ROYANN WILDASIN MRN: [id number] Date of Birth: 12-Jan-1941

## 2018-12-19 ENCOUNTER — Encounter (INDEPENDENT_AMBULATORY_CARE_PROVIDER_SITE_OTHER): Payer: Medicare Other | Admitting: Ophthalmology

## 2018-12-19 ENCOUNTER — Telehealth: Payer: Self-pay

## 2018-12-19 DIAGNOSIS — E11311 Type 2 diabetes mellitus with unspecified diabetic retinopathy with macular edema: Secondary | ICD-10-CM | POA: Diagnosis not present

## 2018-12-19 DIAGNOSIS — H34831 Tributary (branch) retinal vein occlusion, right eye, with macular edema: Secondary | ICD-10-CM | POA: Diagnosis not present

## 2018-12-19 DIAGNOSIS — H35033 Hypertensive retinopathy, bilateral: Secondary | ICD-10-CM | POA: Diagnosis not present

## 2018-12-19 DIAGNOSIS — E113392 Type 2 diabetes mellitus with moderate nonproliferative diabetic retinopathy without macular edema, left eye: Secondary | ICD-10-CM

## 2018-12-19 DIAGNOSIS — E113311 Type 2 diabetes mellitus with moderate nonproliferative diabetic retinopathy with macular edema, right eye: Secondary | ICD-10-CM

## 2018-12-19 DIAGNOSIS — I10 Essential (primary) hypertension: Secondary | ICD-10-CM | POA: Diagnosis not present

## 2018-12-19 DIAGNOSIS — H43813 Vitreous degeneration, bilateral: Secondary | ICD-10-CM

## 2018-12-19 NOTE — Telephone Encounter (Signed)
Nurse reviewed MRI results with MD. Pt was notified of MRI results.  Voiced understanding, no further needs at this time.

## 2018-12-19 NOTE — Telephone Encounter (Signed)
Left message for patient to return call Re: MRI Brain.

## 2018-12-23 ENCOUNTER — Ambulatory Visit: Payer: Medicare Other | Admitting: Physical Therapy

## 2018-12-26 ENCOUNTER — Encounter: Payer: Self-pay | Admitting: Physical Therapy

## 2018-12-26 NOTE — Therapy (Signed)
[facility] [address] [place], [place], [address] Phone: [phone]   Fax:  [phone]  Patient Details  Name: Carolyn Weber MRN: [id number] Date of Birth: 1941-10-28 Referring Provider:  No ref. provider found  Encounter Date: 12/26/2018   PHYSICAL THERAPY DISCHARGE SUMMARY  Visits from Start of Care: 5  Current functional level related to goals / functional outcomes: PT Long Term Goals - 12/16/18 1359      PT LONG TERM GOAL #1   Title  Pt will be independent with HEP for improved balance, decreased fall risk.  TARGET 12/02/18 (may be delayed due to pt's upcoming breast surgery)    Time  4    Period  Weeks    Status  Achieved      PT LONG TERM GOAL #2   Title  Pt will improve TUG score to less than or equal to 13.5 seconds for decreased fall risk.    Baseline  12/16/18-10.0 seconds    Time  4    Period  Weeks    Status  Achieved      PT LONG TERM GOAL #3   Title  Pt will improve DGI score to at least 17/24 for decreased fall risk.    Baseline  12/16/18-22/24    Time  4    Period  Weeks    Status  Achieved      PT LONG TERM GOAL #4   Title  Pt will verbalize understanding of fall prevention in home environment.    Time  4    Period  Weeks    Status  Achieved      Pt has met all LTGs.   Remaining deficits: High level balance   Education / Equipment: Educated in [employer], fall prevention, community fitness (pt is participating in [employer] [facility] [address]).  Plan: Patient agrees to discharge.  Patient goals were met. Patient is being discharged due to meeting the stated rehab goals.  ?????       Carolyn Witczak W. 12/26/2018, 11:24 AM  Mady Haagensen, PT 12/26/18 11:25 AM Phone: [phone] Fax: [facility] [address] [place], [place], [address] Phone: [phone]   Fax:  [phone]

## 2018-12-29 NOTE — Pre-Procedure Instructions (Signed)
Carolyn Weber  12/29/2018      [facility] [address] [place] ([place] [address] Phone: [phone] Fax: [phone]    Your procedure is scheduled on March 6.  Report to [facility] Admitting at 5:30 A.M.  Call this number if you have problems the morning of surgery:  [phone]   Remember:  Do not eat  after midnight.   You may drink clear liquids until 4:30 A.M. .  Clear liquids allowed are:                    Water, Juice (non-citric and without pulp), Carbonated beverages, Clear Tea, Black Coffee only, Plain Jell-O only, Gatorade and Plain Popsicles only    Take these medicines the morning of surgery with A SIP OF WATER :             Tylenol if needed             Amlodipine (norvasc)             Cetirizine (zyrtec)                               7 days prior to surgery STOP taking any Aspirin (unless otherwise instructed by your surgeon), Aleve, Naproxen, Ibuprofen, Motrin, Advil, Goody's, BC's, all herbal medications, fish oil, and all vitamins.                How to Manage Your Diabetes Before and After Surgery  Why is it important to control my blood sugar before and after surgery? . Improving blood sugar levels before and after surgery helps healing and can limit problems. . A way of improving blood sugar control is eating a healthy diet by: o  Eating less sugar and carbohydrates o  Increasing activity/exercise o  Talking with your doctor about reaching your blood sugar goals . High blood sugars (greater than 180 mg/dL) can raise your risk of infections and slow your recovery, so you will need to focus on controlling your diabetes during the weeks before surgery. . Make sure that the doctor who takes care of your diabetes knows about your planned surgery including the date and location.  How do I manage my blood sugar before surgery? . Check your blood sugar at least 4 times a day,  starting 2 days before surgery, to make sure that the level is not too high or low. o Check your blood sugar the morning of your surgery when you wake up and every 2 hours until you get to the Short Stay unit. . If your blood sugar is less than 70 mg/dL, you will need to treat for low blood sugar: o Do not take insulin. o Treat a low blood sugar (less than 70 mg/dL) with  cup of clear juice (cranberry or apple), 4 glucose tablets, OR glucose gel. Recheck blood sugar in 15 minutes after treatment (to make sure it is greater than 70 mg/dL). If your blood sugar is not greater than 70 mg/dL on recheck, call [phone] o  for further instructions. . Report your blood sugar to the short stay nurse when you get to Short Stay.  . If you are admitted to the hospital after surgery: o Your blood sugar will be checked by the staff and you will probably be given insulin after surgery (instead of oral  diabetes medicines) to make sure you have good blood sugar levels. o The goal for blood sugar control after surgery is 80-180 mg/dL.      Do not wear jewelry, make-up or nail polish.  Do not wear lotions, powders, or perfumes, or deodorant.  Do not shave 48 hours prior to surgery.  Men may shave face and neck.  Do not bring valuables to the hospital.  [facility] is not responsible for any belongings or valuables.  Contacts, dentures or bridgework may not be worn into surgery.  Leave your suitcase in the car.  After surgery it may be brought to your room.  For patients admitted to the hospital, discharge time will be determined by your treatment team.  Patients discharged the day of surgery will not be allowed to drive home.    Special instructions:  [facility]- Preparing For Surgery  Before surgery, you can play an important role. Because skin is not sterile, your skin needs to be as free of germs as possible. You can reduce the number of germs on your skin by washing with CHG (chlorahexidine  gluconate) Soap before surgery.  CHG is an antiseptic cleaner which kills germs and bonds with the skin to continue killing germs even after washing.    Oral Hygiene is also important to reduce your risk of infection.  Remember - BRUSH YOUR TEETH THE MORNING OF SURGERY WITH YOUR REGULAR TOOTHPASTE  Please do not use if you have an allergy to CHG or antibacterial soaps. If your skin becomes reddened/irritated stop using the CHG.  Do not shave (including legs and underarms) for at least 48 hours prior to first CHG shower. It is OK to shave your face.  Please follow these instructions carefully.   1. Shower the NIGHT BEFORE SURGERY and the MORNING OF SURGERY with CHG.   2. If you chose to wash your hair, wash your hair first as usual with your normal shampoo.  3. After you shampoo, rinse your hair and body thoroughly to remove the shampoo.  4. Use CHG as you would any other liquid soap. You can apply CHG directly to the skin and wash gently with a scrungie or a clean washcloth.   5. Apply the CHG Soap to your body ONLY FROM THE NECK DOWN.  Do not use on open wounds or open sores. Avoid contact with your eyes, ears, mouth and genitals (private parts). Wash Face and genitals (private parts)  with your normal soap.  6. Wash thoroughly, paying special attention to the area where your surgery will be performed.  7. Thoroughly rinse your body with warm water from the neck down.  8. DO NOT shower/wash with your normal soap after using and rinsing off the CHG Soap.  9. Pat yourself dry with a CLEAN TOWEL.  10. Wear CLEAN PAJAMAS to bed the night before surgery, wear comfortable clothes the morning of surgery  11. Place CLEAN SHEETS on your bed the night of your first shower and DO NOT SLEEP WITH PETS.    Day of Surgery:  Do not apply any deodorants/lotions.  Please wear clean clothes to the hospital/surgery center.   Remember to brush your teeth WITH YOUR REGULAR TOOTHPASTE.    Please  read over the following fact sheets that you were given. Coughing and Deep Breathing and Surgical Site Infection Prevention

## 2018-12-30 ENCOUNTER — Encounter (HOSPITAL_COMMUNITY)
Admission: RE | Admit: 2018-12-30 | Discharge: 2018-12-30 | Disposition: A | Discharge status: Home / Self Care | Payer: Medicare Other | Source: Ambulatory Visit | Attending: General Surgery | Admitting: General Surgery

## 2018-12-30 ENCOUNTER — Ambulatory Visit: Payer: Medicare Other | Admitting: Physical Therapy

## 2018-12-30 ENCOUNTER — Other Ambulatory Visit: Payer: Self-pay

## 2018-12-30 ENCOUNTER — Encounter (HOSPITAL_COMMUNITY): Payer: Self-pay

## 2018-12-30 DIAGNOSIS — Z01818 Encounter for other preprocedural examination: Secondary | ICD-10-CM | POA: Diagnosis not present

## 2018-12-30 HISTORY — DX: Type 2 diabetes mellitus with unspecified diabetic retinopathy without macular edema: E11.319

## 2018-12-30 HISTORY — DX: Prediabetes: R73.03

## 2018-12-30 HISTORY — DX: Transient cerebral ischemic attack, unspecified: G45.9

## 2018-12-30 HISTORY — DX: Other difficulties with micturition: R39.198

## 2018-12-30 HISTORY — DX: Chronic kidney disease, unspecified: N18.9

## 2018-12-30 LAB — GLUCOSE, CAPILLARY: Glucose-Capillary: 119 mg/dL — ABNORMAL HIGH (ref 70–99)

## 2018-12-30 LAB — CBC WITH DIFFERENTIAL/PLATELET
Abs Immature Granulocytes: 0.02 10*3/uL (ref 0.00–0.07)
Basophils Absolute: 0 10*3/uL (ref 0.0–0.1)
Basophils Relative: 1 %
EOS ABS: 0.3 10*3/uL (ref 0.0–0.5)
EOS PCT: 5 %
HCT: 41.3 % (ref 36.0–46.0)
Hemoglobin: 12.9 g/dL (ref 12.0–15.0)
Immature Granulocytes: 0 %
Lymphocytes Relative: 20 %
Lymphs Abs: 1.1 10*3/uL (ref 0.7–4.0)
MCH: 26.9 pg (ref 26.0–34.0)
MCHC: 31.2 g/dL (ref 30.0–36.0)
MCV: 86.2 fL (ref 80.0–100.0)
MONO ABS: 0.5 10*3/uL (ref 0.1–1.0)
Monocytes Relative: 9 %
Neutro Abs: 3.8 10*3/uL (ref 1.7–7.7)
Neutrophils Relative %: 65 %
Platelets: 218 10*3/uL (ref 150–400)
RBC: 4.79 MIL/uL (ref 3.87–5.11)
RDW: 12.4 % (ref 11.5–15.5)
WBC: 5.7 10*3/uL (ref 4.0–10.5)
nRBC: 0 % (ref 0.0–0.2)

## 2018-12-30 LAB — COMPREHENSIVE METABOLIC PANEL
ALT: 16 U/L (ref 0–44)
AST: 23 U/L (ref 15–41)
Albumin: 4.2 g/dL (ref 3.5–5.0)
Alkaline Phosphatase: 51 U/L (ref 38–126)
Anion gap: 8 (ref 5–15)
BUN: 19 mg/dL (ref 8–23)
CO2: 25 mmol/L (ref 22–32)
Calcium: 10 mg/dL (ref 8.9–10.3)
Chloride: 103 mmol/L (ref 98–111)
Creatinine, Ser: 1.12 mg/dL — ABNORMAL HIGH (ref 0.44–1.00)
GFR calc Af Amer: 55 mL/min — ABNORMAL LOW (ref >60)
GFR calc non Af Amer: 48 mL/min — ABNORMAL LOW (ref >60)
Glucose, Bld: 119 mg/dL — ABNORMAL HIGH (ref 70–99)
POTASSIUM: 4.2 mmol/L (ref 3.5–5.1)
Sodium: 136 mmol/L (ref 135–145)
Total Bilirubin: 0.5 mg/dL (ref 0.3–1.2)
Total Protein: 7.5 g/dL (ref 6.5–8.1)

## 2018-12-30 LAB — PROTIME-INR
INR: 1.1 (ref 0.8–1.2)
Prothrombin Time: 13.9 seconds (ref 11.4–15.2)

## 2018-12-30 LAB — HEMOGLOBIN A1C
Hgb A1c MFr Bld: 6.9 % — ABNORMAL HIGH (ref 4.8–5.6)
Mean Plasma Glucose: 151.33 mg/dL

## 2018-12-30 NOTE — Progress Notes (Signed)
PCP - ronald Polite Cardiologist - denies  Chest x-ray - not needeed EKG - 12/30/18 Stress Test - denies ECHO - 10/24/18 Cardiac Cath - denies  patietn doesn't check her blood sugars  Blood Thinner Instructions: last dose 12/24/18  Anesthesia review: No  Patient denies shortness of breath, fever, cough and chest pain at PAT appointment   Patient verbalized understanding of instructions that were given to them at the PAT appointment. Patient was also instructed that they will need to review over the PAT instructions again at home before surgery.

## 2018-12-30 NOTE — Pre-Procedure Instructions (Signed)
MARILU RYLANDER  12/30/2018      [place] ([place]), [place] [address] [place] ([place] [address] Phone: [phone] Fax: [phone]    Your procedure is scheduled on March 6.  Report to [facility] Admitting at 5:30 A.M.  Call this number if you have problems the morning of surgery:  [phone]   Remember:  Do not eat  after midnight.   You may drink clear liquids until 4:30 A.M. .  Clear liquids allowed are:                    Water, Juice (non-citric and without pulp), Carbonated beverages, Clear Tea, Black Coffee only, Plain Jell-O only, Gatorade and Plain Popsicles only    Take these medicines the morning of surgery with A SIP OF WATER :             Tylenol if needed             Amlodipine (norvasc)             Cetirizine (zyrtec)                               7 days prior to surgery STOP taking any Aspirin (unless otherwise instructed by your surgeon), Aleve, Naproxen, Ibuprofen, Motrin, Advil, Goody's, BC's, all herbal medications, fish oil, and all vitamins  Follow your surgeon's instructions on when to stop Plavix.  If no instructions were given by your surgeon then you will need to call the office to get those instructions.      How to Manage Your Diabetes Before and After Surgery  Why is it important to control my blood sugar before and after surgery? . Improving blood sugar levels before and after surgery helps healing and can limit problems. . A way of improving blood sugar control is eating a healthy diet by: o  Eating less sugar and carbohydrates o  Increasing activity/exercise o  Talking with your doctor about reaching your blood sugar goals . High blood sugars (greater than 180 mg/dL) can raise your risk of infections and slow your recovery, so you will need to focus on controlling your diabetes during the weeks before surgery. . Make sure that the doctor who takes care of your diabetes  knows about your planned surgery including the date and location.  How do I manage my blood sugar before surgery? . Check your blood sugar at least 4 times a day, starting 2 days before surgery, to make sure that the level is not too high or low. o Check your blood sugar the morning of your surgery when you wake up and every 2 hours until you get to the Short Stay unit. . If your blood sugar is less than 70 mg/dL, you will need to treat for low blood sugar: o Do not take insulin. o Treat a low blood sugar (less than 70 mg/dL) with  cup of clear juice (cranberry or apple), 4 glucose tablets, OR glucose gel. o Recheck blood sugar in 15 minutes after treatment (to make sure it is greater than 70 mg/dL). If your blood sugar is not greater than 70 mg/dL on recheck, call [phone] for further instructions. . Report your blood sugar to the short stay nurse when you get to Short Stay.  . If you are admitted to the hospital after surgery: o Your  blood sugar will be checked by the staff and you will probably be given insulin after surgery (instead of oral diabetes medicines) to make sure you have good blood sugar levels. o The goal for blood sugar control after surgery is 80-180 mg/dL.    Do not wear jewelry, make-up or nail polish.  Do not wear lotions, powders, or perfumes, or deodorant.  Do not shave 48 hours prior to surgery.  Men may shave face and neck.  Do not bring valuables to the hospital.  [facility] is not responsible for any belongings or valuables.  Contacts, dentures or bridgework may not be worn into surgery.  Leave your suitcase in the car.  After surgery it may be brought to your room.  For patients admitted to the hospital, discharge time will be determined by your treatment team.  Patients discharged the day of surgery will not be allowed to drive home.    Special instructions:  [place]- Preparing For Surgery  Before surgery, you can play an important role. Because  skin is not sterile, your skin needs to be as free of germs as possible. You can reduce the number of germs on your skin by washing with CHG (chlorahexidine gluconate) Soap before surgery.  CHG is an antiseptic cleaner which kills germs and bonds with the skin to continue killing germs even after washing.    Oral Hygiene is also important to reduce your risk of infection.  Remember - BRUSH YOUR TEETH THE MORNING OF SURGERY WITH YOUR REGULAR TOOTHPASTE  Please do not use if you have an allergy to CHG or antibacterial soaps. If your skin becomes reddened/irritated stop using the CHG.  Do not shave (including legs and underarms) for at least 48 hours prior to first CHG shower. It is OK to shave your face.  Please follow these instructions carefully.   1. Shower the NIGHT BEFORE SURGERY and the MORNING OF SURGERY with CHG.   2. If you chose to wash your hair, wash your hair first as usual with your normal shampoo.  3. After you shampoo, rinse your hair and body thoroughly to remove the shampoo.  4. Use CHG as you would any other liquid soap. You can apply CHG directly to the skin and wash gently with a scrungie or a clean washcloth.   5. Apply the CHG Soap to your body ONLY FROM THE NECK DOWN.  Do not use on open wounds or open sores. Avoid contact with your eyes, ears, mouth and genitals (private parts). Wash Face and genitals (private parts)  with your normal soap.  6. Wash thoroughly, paying special attention to the area where your surgery will be performed.  7. Thoroughly rinse your body with warm water from the neck down.  8. DO NOT shower/wash with your normal soap after using and rinsing off the CHG Soap.  9. Pat yourself dry with a CLEAN TOWEL.  10. Wear CLEAN PAJAMAS to bed the night before surgery, wear comfortable clothes the morning of surgery  11. Place CLEAN SHEETS on your bed the night of your first shower and DO NOT SLEEP WITH PETS.    Day of Surgery:  Do not apply any  deodorants/lotions.  Please wear clean clothes to the hospital/surgery center.   Remember to brush your teeth WITH YOUR REGULAR TOOTHPASTE.    Please read over the following fact sheets that you were given.

## 2019-01-01 HISTORY — PX: BREAST LUMPECTOMY: SHX2

## 2019-01-01 NOTE — H&P (Signed)
Carolyn Weber Location: [facility] Patient #: [id number] DOB: February 27, 1941 Divorced / Language: English / Race: Black or African American Female       History of Present Illness      . This is a 78 year old female who presents for preoperative evaluation for cancer of the right breast. . 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 Lindi Weber is her oncologist. Carolyn Weber is her PCP. Dr. Leta Weber is her neurologist      Remote history right breast biopsy for calcifications by Dr. [name] many years ago. Annual screening mammograms have been done but recent imaging studies show a 9 mm mass in the right breast at the 2:30 position 3 cm from the nipple. 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 ER 95%. PR 90%. HER-2 negative. Ki-67 5% We were planning right breast lumpectomy without sentinel lymph node biopsy. She developed some neurologic symptoms and saw Dr. [name]. He thought it was a TIA. She was started on Plavix. She saw Dr. [name] and MRI suggested pontine lesion most consistent with ischemic stroke. After multi-disciplinary conversation we decided to hold off on the surgery for a full 3 months to reduce the chance of extending her stroke. She's been on letrozole since that time and tolerating that. Following the stroke she had balance and handwriting problems for about 2 weeks but that is now resolved and she is now driving a car and feels fine. She takes Plavix daily.      Comorbidities include recent stroke. BMI 34. Hypertension. Hyperlipidemia. Non-insulin-dependent diabetes. CK D2. Prior right breast biopsy Family history significant for breast cancer and mother who died of metastatic disease. Mother also had a stroke and diabetes. Father had prostate cancer diabetes and heart disease but did not die  from the prostate cancer. 2 maternal aunts had breast cancer and/or survivors. 3 maternal cousins had breast cancer. She has had genetic testing and it is negative. Social history reveals that she is divorced. Has a son and a daughter. Denies alcohol or tobacco. Lives in [place]. 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     We had a long discussion. He still motivated for breast conservation. Consensus recommendation was that she does not need a sentinel node. She'll be rescheduled for right breast lumpectomy with radioactive seed localization This will be scheduled on or after March from the first so she can have a full 3 months from the time of her presumed CVA I discussed the indications, details, technique, and numerous risk of the surgery with her once again She understands all these issues and all of her questions are answered and she agrees with this plan  Check brain MRI from today. Discontinue Plavix 5 days preop and we will need approval from Dr. Leta Weber once he reviews the brain MRI from today--"Looks like CVA, not tumor" Hopefully we can restart the Plavix one to 2 days postop   Allergies  No Known Drug Allergies Allergies Reconciled   Medication History  Besivance (0.6% Suspension, Ophthalmic) Active. Imipramine HCl (25MG Tablet, Oral) Active. Pazeo (0.7% Solution, Ophthalmic) Active. amLODIPine Besylate (10MG Tablet, Oral) Active. Zocor (10MG Tablet, Oral) Active. Tylenol Extra Strength (500MG Tablet, Oral) Active. Oxybutynin (3.9MG/24HR Patch TW, Transdermal) Active. Aspirin (81MG Tablet Chewable, Oral) Active. Cetirizine HCl (10MG Tablet, Oral) Active. Docusate Calcium (240MG Capsule, Oral) Active. Polyethyl  Glyc-Propyl Glyc PF (0.4-0.3% Solution, Ophthalmic) Active. Alavert (10MG Tablet, Oral) Active. Calcium-Vitamin D (600MG Tablet Chewable, Oral) Active. Clobetasol Propionate (0.05% Cream, External) Active. Multivitamin Adult  (Oral) Active. Letrozole (2.5MG Tablet, Oral) Active. Lipitor (40MG Tablet, Oral) Active. Plavix (75MG Tablet, Oral) Active. Medications Reconciled  Vitals  Weight: 204.2 lb Height: 64in Body Surface Area: 1.97 m Body Mass Index: 35.05 kg/m  Temp.: 97.14F(Temporal)  Pulse: 109 (Regular)  P.OX: 97% (Room air) BP: 168/72 (Sitting, Right Arm, Standard)       Physical Exam  General Mental Status-Alert. General Appearance-Not in acute distress. Build & Nutrition-Well nourished. Posture-Normal posture. Gait-Normal.  Head and Neck Head-normocephalic, atraumatic with no lesions or palpable masses. Trachea-midline. Thyroid Gland Characteristics - normal size and consistency and no palpable nodules.  Chest and Lung Exam Chest and lung exam reveals -on auscultation, normal breath sounds, no adventitious sounds and normal vocal resonance.  Breast Note: Breasts are large. No hematoma or mass on either side. Skin healthy. No adenopathy on either side.   Cardiovascular Cardiovascular examination reveals -normal heart sounds, regular rate and rhythm with no murmurs and femoral artery auscultation bilaterally reveals normal pulses, no bruits, no thrills.  Abdomen Inspection Inspection of the abdomen reveals - No Hernias. Palpation/Percussion Palpation and Percussion of the abdomen reveal - Soft, Non Tender, No Rigidity (guarding), No hepatosplenomegaly and No Palpable abdominal masses.  Neurologic Neurologic evaluation reveals -alert and oriented x 3 with no impairment of recent or remote memory, normal attention span and ability to concentrate, normal sensation and normal coordination.  Musculoskeletal Normal Exam - Bilateral-Upper Extremity Strength Normal and Lower Extremity Strength Normal.    Assessment & Plan  PRIMARY CANCER OF UPPER INNER QUADRANT OF RIGHT FEMALE BREAST (C50.211)   You are recovering from your stroke very  nicely. MRI of brain was done today but that has not been read or reported Hopefully that information will be available tomorrow Your neurologist will discuss this with you  We will schedule you for right breast lumpectomy with radioactive seed localization This will need to be done on or after March 1 according to your neurologist you will need to stop her Plavix a full 5 days prior to the surgery You will not need to receive blood thinner shots before the surgery We will check with your neurologist to make sure he still approves of this Once your neurologist gives [place] the go ahead we will set the date of surgery  I have discussed the indications, techniques, and risk of the surgery with you and your family in detail  FAMILY HISTORY OF BREAST CANCER IN FEMALE (Z80.3) CVA (CEREBRAL VASCULAR ACCIDENT) (I63.9) BMI 34.0-34.9,ADULT (Z68.34) ANTICOAGULATED (Z79.01) Impression: plavix 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 Dalbert Batman, M.D., [facility] General and Minimally invasive Surgery Breast and Colorectal Surgery Office:   [phone] Pager:   [phone]

## 2019-01-04 ENCOUNTER — Ambulatory Visit
Admission: RE | Admit: 2019-01-04 | Discharge: 2019-01-04 | Disposition: A | Discharge status: Home / Self Care | Payer: Medicare Other | Source: Ambulatory Visit | Attending: General Surgery | Admitting: General Surgery

## 2019-01-04 DIAGNOSIS — Z170 Estrogen receptor positive status [ER+]: Principal | ICD-10-CM

## 2019-01-04 DIAGNOSIS — C50211 Malignant neoplasm of upper-inner quadrant of right female breast: Secondary | ICD-10-CM

## 2019-01-05 NOTE — Anesthesia Preprocedure Evaluation (Addendum)
Anesthesia Evaluation  Patient identified by MRN, date of birth, ID band Patient awake    Reviewed: Allergy & Precautions, NPO status , Patient's Chart, lab work & pertinent test results  Airway Mallampati: II  TM Distance: >3 FB Neck ROM: Full    Dental  (+) Dental Advisory Given, Teeth Intact   Pulmonary former smoker,    Pulmonary exam normal breath sounds clear to auscultation       Cardiovascular hypertension, Pt. on medications Normal cardiovascular exam Rhythm:Regular Rate:Normal     Neuro/Psych TIA   GI/Hepatic negative GI ROS, Neg liver ROS,   Endo/Other  diabetes, Poorly Controlled  Renal/GU Renal disease     Musculoskeletal  (+) Arthritis ,   Abdominal   Peds  Hematology negative hematology ROS (+)   Anesthesia Other Findings Day of surgery medications reviewed with the patient.  Reproductive/Obstetrics                            Anesthesia Physical Anesthesia Plan  ASA: III  Anesthesia Plan: General   Post-op Pain Management:    Induction: Intravenous  PONV Risk Score and Plan: 4 or greater and Ondansetron, Treatment may vary due to age or medical condition, Propofol infusion and Metaclopromide  Airway Management Planned: LMA  Additional Equipment: None  Intra-op Plan:   Post-operative Plan: Extubation in OR  Informed Consent: I have reviewed the patients History and Physical, chart, labs and discussed the procedure including the risks, benefits and alternatives for the proposed anesthesia with the patient or authorized representative who has indicated his/her understanding and acceptance.     Dental advisory given  Plan Discussed with: CRNA  Anesthesia Plan Comments:         Anesthesia Quick Evaluation

## 2019-01-06 ENCOUNTER — Ambulatory Visit (HOSPITAL_COMMUNITY): Payer: Medicare Other | Admitting: Certified Registered Nurse Anesthetist

## 2019-01-06 ENCOUNTER — Ambulatory Visit
Admission: RE | Admit: 2019-01-06 | Discharge: 2019-01-06 | Disposition: A | Discharge status: Home / Self Care | Payer: Medicare Other | Source: Ambulatory Visit | Attending: General Surgery | Admitting: General Surgery

## 2019-01-06 ENCOUNTER — Encounter (HOSPITAL_COMMUNITY): Payer: Self-pay

## 2019-01-06 ENCOUNTER — Other Ambulatory Visit: Payer: Self-pay

## 2019-01-06 ENCOUNTER — Encounter (HOSPITAL_COMMUNITY)
Admission: RE | Disposition: A | Discharge status: Home / Self Care | Payer: Self-pay | Source: Home / Self Care | Attending: General Surgery

## 2019-01-06 ENCOUNTER — Ambulatory Visit (HOSPITAL_COMMUNITY)
Admission: RE | Admit: 2019-01-06 | Discharge: 2019-01-06 | Disposition: A | Discharge status: Home / Self Care | Payer: Medicare Other | Attending: General Surgery | Admitting: General Surgery

## 2019-01-06 DIAGNOSIS — C50211 Malignant neoplasm of upper-inner quadrant of right female breast: Secondary | ICD-10-CM

## 2019-01-06 DIAGNOSIS — I129 Hypertensive chronic kidney disease with stage 1 through stage 4 chronic kidney disease, or unspecified chronic kidney disease: Secondary | ICD-10-CM | POA: Insufficient documentation

## 2019-01-06 DIAGNOSIS — Z170 Estrogen receptor positive status [ER+]: Secondary | ICD-10-CM | POA: Diagnosis not present

## 2019-01-06 DIAGNOSIS — Z79899 Other long term (current) drug therapy: Secondary | ICD-10-CM | POA: Insufficient documentation

## 2019-01-06 DIAGNOSIS — N182 Chronic kidney disease, stage 2 (mild): Secondary | ICD-10-CM | POA: Insufficient documentation

## 2019-01-06 DIAGNOSIS — Z87891 Personal history of nicotine dependence: Secondary | ICD-10-CM | POA: Insufficient documentation

## 2019-01-06 DIAGNOSIS — Z7982 Long term (current) use of aspirin: Secondary | ICD-10-CM | POA: Diagnosis not present

## 2019-01-06 DIAGNOSIS — Z803 Family history of malignant neoplasm of breast: Secondary | ICD-10-CM | POA: Diagnosis not present

## 2019-01-06 DIAGNOSIS — Z7902 Long term (current) use of antithrombotics/antiplatelets: Secondary | ICD-10-CM | POA: Insufficient documentation

## 2019-01-06 DIAGNOSIS — Z8673 Personal history of transient ischemic attack (TIA), and cerebral infarction without residual deficits: Secondary | ICD-10-CM | POA: Diagnosis not present

## 2019-01-06 DIAGNOSIS — E1122 Type 2 diabetes mellitus with diabetic chronic kidney disease: Secondary | ICD-10-CM | POA: Diagnosis not present

## 2019-01-06 DIAGNOSIS — E785 Hyperlipidemia, unspecified: Secondary | ICD-10-CM | POA: Insufficient documentation

## 2019-01-06 HISTORY — PX: BREAST LUMPECTOMY WITH RADIOACTIVE SEED LOCALIZATION: SHX6424

## 2019-01-06 HISTORY — DX: Adverse effect of unspecified anesthetic, initial encounter: T41.45XA

## 2019-01-06 HISTORY — DX: Other complications of anesthesia, initial encounter: T88.59XA

## 2019-01-06 LAB — GLUCOSE, CAPILLARY
Glucose-Capillary: 96 mg/dL (ref 70–99)
Glucose-Capillary: 129 mg/dL — ABNORMAL HIGH (ref 70–99)

## 2019-01-06 SURGERY — BREAST LUMPECTOMY WITH RADIOACTIVE SEED LOCALIZATION
Anesthesia: General | Site: Breast | Laterality: Right

## 2019-01-06 MED ORDER — LACTATED RINGERS IV SOLN
INTRAVENOUS | Status: DC | PRN
  Administered 2019-01-06: 07:00:00 via INTRAVENOUS

## 2019-01-06 MED ORDER — PROPOFOL 10 MG/ML IV BOLUS
INTRAVENOUS | Status: AC
  Filled 2019-01-06: qty 20

## 2019-01-06 MED ORDER — METOCLOPRAMIDE HCL 5 MG/ML IJ SOLN
INTRAMUSCULAR | Status: DC | PRN
  Administered 2019-01-06: 10 mg via INTRAVENOUS

## 2019-01-06 MED ORDER — MEPERIDINE HCL 50 MG/ML IJ SOLN: 6.2500 mg | INTRAMUSCULAR | Status: DC | PRN

## 2019-01-06 MED ORDER — ACETAMINOPHEN 500 MG PO TABS
1000.0000 mg | ORAL_TABLET | ORAL | Status: AC
  Administered 2019-01-06: 500 mg via ORAL

## 2019-01-06 MED ORDER — METOCLOPRAMIDE HCL 5 MG/ML IJ SOLN
INTRAMUSCULAR | Status: AC
  Filled 2019-01-06: qty 2

## 2019-01-06 MED ORDER — 0.9 % SODIUM CHLORIDE (POUR BTL) OPTIME
TOPICAL | Status: DC | PRN
  Administered 2019-01-06: 1000 mL

## 2019-01-06 MED ORDER — FENTANYL CITRATE (PF) 100 MCG/2ML IJ SOLN
INTRAMUSCULAR | Status: AC
  Filled 2019-01-06: qty 2

## 2019-01-06 MED ORDER — BUPIVACAINE-EPINEPHRINE (PF) 0.25% -1:200000 IJ SOLN
INTRAMUSCULAR | Status: AC
  Filled 2019-01-06: qty 30

## 2019-01-06 MED ORDER — CHLORHEXIDINE GLUCONATE CLOTH 2 % EX PADS: 6.0000 | MEDICATED_PAD | Freq: Once | CUTANEOUS | Status: DC

## 2019-01-06 MED ORDER — MIDAZOLAM HCL 2 MG/2ML IJ SOLN
INTRAMUSCULAR | Status: AC
  Filled 2019-01-06: qty 2

## 2019-01-06 MED ORDER — FENTANYL CITRATE (PF) 100 MCG/2ML IJ SOLN
INTRAMUSCULAR | Status: DC | PRN
  Administered 2019-01-06 (×3): 25 ug via INTRAVENOUS

## 2019-01-06 MED ORDER — FENTANYL CITRATE (PF) 250 MCG/5ML IJ SOLN
INTRAMUSCULAR | Status: AC
  Filled 2019-01-06: qty 5

## 2019-01-06 MED ORDER — BUPIVACAINE-EPINEPHRINE 0.25% -1:200000 IJ SOLN
INTRAMUSCULAR | Status: DC | PRN
  Administered 2019-01-06: 20 mL

## 2019-01-06 MED ORDER — LIDOCAINE 2% (20 MG/ML) 5 ML SYRINGE
INTRAMUSCULAR | Status: DC | PRN
  Administered 2019-01-06: 100 mg via INTRAVENOUS

## 2019-01-06 MED ORDER — GABAPENTIN 300 MG PO CAPS
ORAL_CAPSULE | ORAL | Status: AC
  Administered 2019-01-06: 300 mg via ORAL
  Filled 2019-01-06: qty 1

## 2019-01-06 MED ORDER — PROPOFOL 500 MG/50ML IV EMUL
INTRAVENOUS | Status: DC | PRN
  Administered 2019-01-06: 50 ug/kg/min via INTRAVENOUS

## 2019-01-06 MED ORDER — GABAPENTIN 300 MG PO CAPS
300.0000 mg | ORAL_CAPSULE | ORAL | Status: AC
  Administered 2019-01-06: 300 mg via ORAL

## 2019-01-06 MED ORDER — ONDANSETRON HCL 4 MG/2ML IJ SOLN
INTRAMUSCULAR | Status: AC
  Filled 2019-01-06: qty 2

## 2019-01-06 MED ORDER — PROMETHAZINE HCL 25 MG/ML IJ SOLN: 6.2500 mg | INTRAMUSCULAR | Status: DC | PRN

## 2019-01-06 MED ORDER — LIDOCAINE 2% (20 MG/ML) 5 ML SYRINGE
INTRAMUSCULAR | Status: AC
  Filled 2019-01-06: qty 5

## 2019-01-06 MED ORDER — ONDANSETRON HCL 4 MG/2ML IJ SOLN
INTRAMUSCULAR | Status: DC | PRN
  Administered 2019-01-06: 4 mg via INTRAVENOUS

## 2019-01-06 MED ORDER — FENTANYL CITRATE (PF) 100 MCG/2ML IJ SOLN
25.0000 ug | INTRAMUSCULAR | Status: DC | PRN
  Administered 2019-01-06 (×2): 25 ug via INTRAVENOUS

## 2019-01-06 MED ORDER — ACETAMINOPHEN 500 MG PO TABS
ORAL_TABLET | ORAL | Status: AC
  Administered 2019-01-06: 500 mg via ORAL
  Filled 2019-01-06: qty 2

## 2019-01-06 MED ORDER — CEFAZOLIN SODIUM-DEXTROSE 2-4 GM/100ML-% IV SOLN
INTRAVENOUS | Status: AC
  Filled 2019-01-06: qty 100

## 2019-01-06 MED ORDER — HYDROCODONE-ACETAMINOPHEN 5-325 MG PO TABS: 1.0000 | ORAL_TABLET | Freq: Four times a day (QID) | ORAL | 0 refills | Status: DC | PRN

## 2019-01-06 MED ORDER — PROPOFOL 10 MG/ML IV BOLUS
INTRAVENOUS | Status: DC | PRN
  Administered 2019-01-06: 160 mg via INTRAVENOUS
  Administered 2019-01-06: 40 mg via INTRAVENOUS

## 2019-01-06 MED ORDER — CEFAZOLIN SODIUM-DEXTROSE 2-4 GM/100ML-% IV SOLN
2.0000 g | INTRAVENOUS | Status: AC
  Administered 2019-01-06: 2 g via INTRAVENOUS

## 2019-01-06 SURGICAL SUPPLY — 40 items
APPLIER CLIP 9.375 MED OPEN (MISCELLANEOUS) ×3
BINDER BREAST LRG (GAUZE/BANDAGES/DRESSINGS) IMPLANT
BINDER BREAST XLRG (GAUZE/BANDAGES/DRESSINGS) IMPLANT
BINDER BREAST XXLRG (GAUZE/BANDAGES/DRESSINGS) ×3 IMPLANT
CANISTER SUCT 3000ML PPV (MISCELLANEOUS) ×3 IMPLANT
CHLORAPREP W/TINT 26ML (MISCELLANEOUS) ×3 IMPLANT
CLIP APPLIE 9.375 MED OPEN (MISCELLANEOUS) ×1 IMPLANT
COVER PROBE W GEL 5X96 (DRAPES) ×3 IMPLANT
COVER SURGICAL LIGHT HANDLE (MISCELLANEOUS) ×3 IMPLANT
COVER WAND RF STERILE (DRAPES) ×3 IMPLANT
DERMABOND ADVANCED (GAUZE/BANDAGES/DRESSINGS) ×2
DERMABOND ADVANCED .7 DNX12 (GAUZE/BANDAGES/DRESSINGS) ×1 IMPLANT
DEVICE DUBIN SPECIMEN MAMMOGRA (MISCELLANEOUS) ×3 IMPLANT
DRAPE CHEST BREAST 15X10 FENES (DRAPES) ×3 IMPLANT
DRSG PAD ABDOMINAL 8X10 ST (GAUZE/BANDAGES/DRESSINGS) ×3 IMPLANT
ELECT CAUTERY BLADE 6.4 (BLADE) ×3 IMPLANT
ELECT REM PT RETURN 9FT ADLT (ELECTROSURGICAL) ×3
ELECTRODE REM PT RTRN 9FT ADLT (ELECTROSURGICAL) ×1 IMPLANT
GAUZE SPONGE 4X4 12PLY STRL (GAUZE/BANDAGES/DRESSINGS) ×3 IMPLANT
GAUZE SPONGE 4X4 12PLY STRL LF (GAUZE/BANDAGES/DRESSINGS) ×3 IMPLANT
GLOVE EUDERMIC 7 POWDERFREE (GLOVE) ×6 IMPLANT
GOWN STRL REUS W/ TWL LRG LVL3 (GOWN DISPOSABLE) ×1 IMPLANT
GOWN STRL REUS W/ TWL XL LVL3 (GOWN DISPOSABLE) ×1 IMPLANT
GOWN STRL REUS W/TWL LRG LVL3 (GOWN DISPOSABLE) ×2
GOWN STRL REUS W/TWL XL LVL3 (GOWN DISPOSABLE) ×2
ILLUMINATOR WAVEGUIDE N/F (MISCELLANEOUS) IMPLANT
KIT BASIN OR (CUSTOM PROCEDURE TRAY) ×3 IMPLANT
KIT MARKER MARGIN INK (KITS) ×3 IMPLANT
LIGHT WAVEGUIDE WIDE FLAT (MISCELLANEOUS) IMPLANT
NEEDLE HYPO 25GX1X1/2 BEV (NEEDLE) ×3 IMPLANT
NS IRRIG 1000ML POUR BTL (IV SOLUTION) ×3 IMPLANT
PACK GENERAL/GYN (CUSTOM PROCEDURE TRAY) ×3 IMPLANT
PAD ABD 8X10 STRL (GAUZE/BANDAGES/DRESSINGS) ×3 IMPLANT
SPONGE LAP 4X18 RFD (DISPOSABLE) ×3 IMPLANT
SUT MNCRL AB 4-0 PS2 18 (SUTURE) ×3 IMPLANT
SUT SILK 2 0 SH (SUTURE) ×3 IMPLANT
SUT VIC AB 3-0 SH 18 (SUTURE) ×3 IMPLANT
SYR CONTROL 10ML LL (SYRINGE) ×3 IMPLANT
TOWEL OR 17X24 6PK STRL BLUE (TOWEL DISPOSABLE) ×3 IMPLANT
TOWEL OR 17X26 10 PK STRL BLUE (TOWEL DISPOSABLE) ×3 IMPLANT

## 2019-01-06 NOTE — Interval H&P Note (Signed)
History and Physical Interval Note:  01/06/2019 5:52 AM  Carolyn Weber  has presented today for surgery, with the diagnosis of RIGHT BREAST CANCER, UPPER INNER QUADRANT  The various methods of treatment have been discussed with the patient and family. After consideration of risks, benefits and other options for treatment, the patient has consented to  Procedure(s): RIGHT BREAST LUMPECTOMY WITH RADIOACTIVE SEED LOCALIZATION (Right) as a surgical intervention .  The patient's history has been reviewed, patient examined, no change in status, stable for surgery.  I have reviewed the patient's chart and labs.  Questions were answered to the patient's satisfaction.     Adin Hector

## 2019-01-06 NOTE — Transfer of Care (Signed)
Immediate Anesthesia Transfer of Care Note  Patient: Carolyn Weber  Procedure(s) Performed: RIGHT BREAST LUMPECTOMY WITH RADIOACTIVE SEED LOCALIZATION (Right Breast)  Patient Location: PACU  Anesthesia Type:General  Level of Consciousness: patient cooperative and responds to stimulation  Airway & Oxygen Therapy: Patient Spontanous Breathing and Patient connected to nasal cannula oxygen,OPA  Post-op Assessment: Report given to RN and Post -op Vital signs reviewed and stable  Post vital signs: Reviewed and stable  Last Vitals:  Vitals Value Taken Time  BP 140/70 01/06/2019  8:32 AM  Temp    Pulse 78 01/06/2019  8:34 AM  Resp 16 01/06/2019  8:34 AM  SpO2 99 % 01/06/2019  8:34 AM  Vitals shown include unvalidated device data.  Last Pain:  Vitals:   01/06/19 0625  PainSc: 0-No pain         Complications: No apparent anesthesia complications

## 2019-01-06 NOTE — Anesthesia Postprocedure Evaluation (Signed)
Anesthesia Post Note  Patient: Carolyn Weber  Procedure(s) Performed: RIGHT BREAST LUMPECTOMY WITH RADIOACTIVE SEED LOCALIZATION (Right Breast)     Patient location during evaluation: PACU Anesthesia Type: General Level of consciousness: sedated and patient cooperative Pain management: pain level controlled Vital Signs Assessment: post-procedure vital signs reviewed and stable Respiratory status: spontaneous breathing Cardiovascular status: stable Anesthetic complications: no    Last Vitals:  Vitals:   01/06/19 0930 01/06/19 0935  BP:  (!) 98/45  Pulse: 73 (!) 58  Resp: 17 16  Temp: (!) 36.4 C   SpO2: 94% 96%    Last Pain:  Vitals:   01/06/19 0935  PainSc: [place]

## 2019-01-06 NOTE — Discharge Instructions (Signed)
[facility] Office Phone Number [phone]  BREAST BIOPSY/ PARTIAL MASTECTOMY: POST OP INSTRUCTIONS  Always review your discharge instruction sheet given to you by the facility where your surgery was performed.  IF YOU HAVE DISABILITY OR FAMILY LEAVE FORMS, YOU MUST BRING THEM TO THE OFFICE FOR PROCESSING.  DO NOT GIVE THEM TO YOUR DOCTOR.  1. A prescription for pain medication may be given to you upon discharge.  Take your pain medication as prescribed, if needed.  If narcotic pain medicine is not needed, then you may take acetaminophen (Tylenol) or ibuprofen (Advil) as needed. 2. Take your usually prescribed medications unless otherwise directed 3. If you need a refill on your pain medication, please contact your pharmacy.  They will contact our office to request authorization.  Prescriptions will not be filled after 5pm or on week-ends. 4. You should eat very light the first 24 hours after surgery, such as soup, crackers, pudding, etc.  Resume your normal diet the day after surgery. 5. Most patients will experience some swelling and bruising in the breast.  Ice packs and a good support bra will help.  Swelling and bruising can take several days to resolve.  6. It is common to experience some constipation if taking pain medication after surgery.  Increasing fluid intake and taking a stool softener will usually help or prevent this problem from occurring.  A mild laxative (Milk of Magnesia or Miralax) should be taken according to package directions if there are no bowel movements after 48 hours. 7. Unless discharge instructions indicate otherwise, you may remove your bandages 24-48 hours after surgery, and you may shower at that time.  You may have steri-strips (small skin tapes) in place directly over the incision.  These strips should be left on the skin for 7-10 days.  If your surgeon used skin glue on the incision, you may shower in 24 hours.  The glue will flake off over the  next 2-3 weeks.  Any sutures or staples will be removed at the office during your follow-up visit. 8. ACTIVITIES:  You may resume regular daily activities (gradually increasing) beginning the next day.  Wearing a good support bra or sports bra minimizes pain and swelling.  You may have sexual intercourse when it is comfortable. a. You may drive when you no longer are taking prescription pain medication, you can comfortably wear a seatbelt, and you can safely maneuver your car and apply brakes. b. RETURN TO WORK:  ______________________________________________________________________________________ 9. You should see your doctor in the office for a follow-up appointment approximately two weeks after your surgery.  Your doctors nurse will typically make your follow-up appointment when she calls you with your pathology report.  Expect your pathology report 2-3 business days after your surgery.  You may call to check if you do not hear from [place] after three days. 10. OTHER INSTRUCTIONS: _______________________________________________________________________________________________ _____________________________________________________________________________________________________________________________________ _____________________________________________________________________________________________________________________________________ _____________________________________________________________________________________________________________________________________  WHEN TO CALL YOUR DOCTOR: 1. Fever over 101.0 2. Nausea and/or vomiting. 3. Extreme swelling or bruising. 4. Continued bleeding from incision. 5. Increased pain, redness, or drainage from the incision.  The clinic staff is available to answer your questions during regular business hours.  Please dont hesitate to call and ask to speak to one of the nurses for clinical concerns.  If you have a medical emergency, go to the nearest  emergency room or call 911.  A surgeon from [facility] is always on call at the hospital.  For further questions, please visit [email or web address]  Use ibuprofen and Tylenol for pain control as detailed below  We will call in a prescription to your pharmacy for hydrocodone if you need it.           Managing Your Pain After Surgery Without Opioids    Thank you for participating in our program to help patients manage their pain after surgery without opioids. This is part of our effort to provide you with the best care possible, without exposing you or your family to the risk that opioids pose.  What pain can I expect after surgery? You can expect to have some pain after surgery. This is normal. The pain is typically worse the day after surgery, and quickly begins to get better. Many studies have found that many patients are able to manage their pain after surgery with Over-the-Counter (OTC) medications such as Tylenol and Motrin. If you have a condition that does not allow you to take Tylenol or Motrin, notify your surgical team.  How will I manage my pain? The best strategy for controlling your pain after surgery is around the clock pain control with Tylenol (acetaminophen) and Motrin (ibuprofen or Advil). Alternating these medications with each other allows you to maximize your pain control. In addition to Tylenol and Motrin, you can use heating pads or ice packs on your incisions to help reduce your pain.  How will I alternate your regular strength over-the-counter pain medication? You will take a dose of pain medication every three hours. ; Start by taking 650 mg of Tylenol (2 pills of 325 mg) ; 3 hours later take 600 mg of Motrin (3 pills of 200 mg) ; 3 hours after taking the Motrin take 650 mg of Tylenol ; 3 hours after that take 600 mg of Motrin.   - 1 -  See example - if your first dose of Tylenol is at 12:00 PM   12:00 PM  Tylenol 650 mg (2 pills of 325 mg)  3:00 PM Motrin 600 mg (3 pills of 200 mg)  6:00 PM Tylenol 650 mg (2 pills of 325 mg)  9:00 PM Motrin 600 mg (3 pills of 200 mg)  Continue alternating every 3 hours   We recommend that you follow this schedule around-the-clock for at least 3 days after surgery, or until you feel that it is no longer needed. Use the table on the last page of this handout to keep track of the medications you are taking. Important: Do not take more than 3000mg  of Tylenol or 3200mg  of Motrin in a 24-hour period. Do not take ibuprofen/Motrin if you have a history of bleeding stomach ulcers, severe kidney disease, &/or actively taking a blood thinner  What if I still have pain? If you have pain that is not controlled with the over-the-counter pain medications (Tylenol and Motrin or Advil) you might have what we call breakthrough pain. You will receive a prescription for a small amount of an opioid pain medication such as Oxycodone, Tramadol, or Tylenol with Codeine. Use these opioid pills in the first 24 hours after surgery if you have breakthrough pain. Do not take more than 1 pill every 4-6 hours.  If you still have uncontrolled pain after using all opioid pills, don't hesitate to call our staff using the number provided. We will help make sure you are managing your pain in the best way possible, and if necessary, we can provide a prescription for additional pain medication.   Day 1    Time  Name of  Medication Number of pills taken  Amount of Acetaminophen  Pain Level   Comments  AM PM       AM PM       AM PM       AM PM       AM PM       AM PM       AM PM       AM PM       Total Daily amount of Acetaminophen Do not take more than  3,000 mg per day      Day 2    Time  Name of Medication Number of pills taken  Amount of Acetaminophen  Pain Level   Comments  AM PM       AM PM       AM PM       AM PM       AM PM       AM PM       AM PM       AM PM        Total Daily amount of Acetaminophen Do not take more than  3,000 mg per day      Day 3    Time  Name of Medication Number of pills taken  Amount of Acetaminophen  Pain Level   Comments  AM PM       AM PM       AM PM       AM PM          AM PM       AM PM       AM PM       AM PM       Total Daily amount of Acetaminophen Do not take more than  3,000 mg per day      Day 4    Time  Name of Medication Number of pills taken  Amount of Acetaminophen  Pain Level   Comments  AM PM       AM PM       AM PM       AM PM       AM PM       AM PM       AM PM       AM PM       Total Daily amount of Acetaminophen Do not take more than  3,000 mg per day      Day 5    Time  Name of Medication Number of pills taken  Amount of Acetaminophen  Pain Level   Comments  AM PM       AM PM       AM PM       AM PM       AM PM       AM PM       AM PM       AM PM       Total Daily amount of Acetaminophen Do not take more than  3,000 mg per day       Day 6    Time  Name of Medication Number of pills taken  Amount of Acetaminophen  Pain Level  Comments  AM PM       AM PM       AM PM       AM PM       AM PM  AM PM       AM PM       AM PM       Total Daily amount of Acetaminophen Do not take more than  3,000 mg per day      Day 7    Time  Name of Medication Number of pills taken  Amount of Acetaminophen  Pain Level   Comments  AM PM       AM PM       AM PM       AM PM       AM PM       AM PM       AM PM       AM PM       Total Daily amount of Acetaminophen Do not take more than  3,000 mg per day        For additional information about how and where to safely dispose of unused opioid medications - [employer]  Disclaimer: This document contains information and/or instructional materials adapted from [place] for the typical patient with your condition. It does not replace medical advice from your health  care provider because your experience may differ from that of the typical patient. Talk to your health care provider if you have any questions about this document, your condition or your treatment plan. Adapted from [place]

## 2019-01-06 NOTE — Op Note (Signed)
Patient Name:           Carolyn Weber   Date of Surgery:        01/06/2019  Pre op Diagnosis:      Cancer right breast, upper inner quadrant, estrogen receptor positive  Post op Diagnosis:    Same  Procedure:                 Right breast lumpectomy with radioactive seed localization, margin assessment;   reexcision medial margin  Surgeon:                     Edsel Petrin. Dalbert Batman, M.D., FACS  Assistant:                      OR staff  Operative Indications:    This is a 78 year old female who who is brought to the operating room for definitive surgery regarding her right breast cancer. 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 [name] is her oncologist. [name] is her PCP. Dr. [name] is her neurologist.  Her Plavix has been held for 5 days. Annual screening mammograms have been done but recent imaging studies show a 9 mm mass in the right breast at the 2:30 position 3 cm from the nipple. 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 ER 95%. PR 90%. HER-2 negative. Ki-67 5% We were planning right breast lumpectomy without sentinel lymph node biopsy. She developed some neurologic symptoms and saw Dr. [name]. He thought it was a TIA. She was started on Plavix. She saw neurology and MRI suggested pontine lesion most consistent with ischemic stroke. After multi-disciplinary conversation we decided to hold off on the surgery for a full 3 months to reduce the chance of extending her stroke. She's been on letrozole since that time and tolerating that.      Comorbidities include recent stroke. BMI 34. Hypertension. Hyperlipidemia. Non-insulin-dependent diabetes. CK D2. Prior right breast biopsy Family history significant for breast cancer and mother who died of metastatic disease. She has had genetic testing and it is negative..     We  had a long discussion. He still motivated for breast conservation. Consensus recommendation was that she does not need a sentinel node. She'll be rescheduled for right breast lumpectomy with radioactive seed localization.   Operative Findings:       The small cancer was in the right breast, upper inner quadrant, 2:30 position.  We used a radially oriented, transverse skin crease incision since Langer's lines were clearly developed in this location.  The specimen mammogram showed the radioactive seed and the original marker clip to be in the relative center of the specimen and adjacent to one another.  I reexcised the medial margin thinking I might be close.  Procedure in Detail:          Following the induction of general LMA anesthesia the patient's right breast was prepped and draped in a sterile fashion.  Surgical timeout was performed.  Intravenous antibiotics were given.  0.25% Marcaine with epinephrine was used as a local infiltration anesthetic.      Using the neoprobe I identified the radioactive signal.  A transverse incision was planned and made with a knife, this was at about the 2:30 position of the right breast..  Lumpectomy was performed using neoprobe and  electrocautery.  The specimen was removed and marked with a 6 color ink kit and silk sutures to orient the pathologist.  Specimen mammogram showed the seed and the marker clip in the center of the specimen.  The specimen was sent to the lab where the seed was retrieved by pathology.  I reexcised the medial margin and marked that with ink and sent that as a separate specimen.  Hemostasis was excellent and achieved with electrocautery.  The wound was irrigated.  5 metal marker clips were placed in the walls of the lumpectomy cavity.  The lumpectomy cavity was closed in 2 layers with interrupted 3-0 Vicryl and the skin closed with a running subcuticular 4-0 Monocryl and Dermabond.  Dry bandages and a breast binder were placed.  The patient  tolerated the procedure well and was taken to PACU in stable condition.  EBL 20 cc.  Counts correct.  Complications none.     Addendum: I logged onto the PMP aware website and reviewed her prescription medication history     Prerana Strayer M. Dalbert Batman, M.D., FACS General and Minimally Invasive Surgery Breast and Colorectal Surgery  01/06/2019 8:30 AM

## 2019-01-07 ENCOUNTER — Encounter (HOSPITAL_COMMUNITY): Payer: Self-pay | Admitting: General Surgery

## 2019-01-10 NOTE — Progress Notes (Signed)
Inform patient of Pathology report,. Tell her that right breast cancer was 8 mm in diameter. Resection margins are negative and she will not need any further surgery, which is obviously good news I will discuss this with her in detail at her next office visit Let me know that you reached her  hmi

## 2019-01-12 NOTE — Progress Notes (Signed)
Patient Care Team: Seward Carol, MD as PCP - General (Internal Medicine)  DIAGNOSIS:    ICD-10-CM   1. Malignant neoplasm of upper-inner quadrant of right breast in female, estrogen receptor positive ([place]) C50.211    Z17.0     SUMMARY OF ONCOLOGIC HISTORY:   Malignant neoplasm of upper-inner quadrant of right breast in female, estrogen receptor positive ([place])   09/21/2018 Initial Diagnosis    Screening mammogram detected right breast mass 2:30 position.  3 cm from nipple 0.9 x 0.6 x 0.8 cm, ultrasound axilla negative biopsy revealed grade 1-2 IDC ER 95%, PR 90%, HER-2 1+ negative, Ki-67 5%, T1b N0 stage Ia clinical stage    10/10/2018 Cancer Staging    Staging form: Breast, AJCC 8th Edition - Clinical: Stage IA (cT1b, cN0, cM0, G2, ER+, PR+, HER2-) - Signed by Nicholas Lose, MD on 10/10/2018    01/06/2019 Surgery    Right lumpectomy: IDC, grade 1, 0.8 cm, margins negative, negative for LV I or PNI, ER 95%, PR 90%, HER-2 negative, Ki-67 5% T1BN0 stage Ia     CHIEF COMPLIANT: Follow-up s/p lumpectomy to review pathology  INTERVAL HISTORY: Carolyn Weber is a 78 y.o. with above-mentioned history of right breast cancer who underwent a right lumpectomy on 01/06/19 for which pathology confirmed the cancer to be 0.8cm grade 1 IDC, ER/PR positive, HER2 negative, Ki67 5% with clear margins. She presents to the clinic today to review the results of recent surgery.  REVIEW OF SYSTEMS:   Constitutional: Denies fevers, chills or abnormal weight loss Eyes: Denies blurriness of vision Ears, nose, mouth, throat, and face: Denies mucositis or sore throat Respiratory: Denies cough, dyspnea or wheezes Cardiovascular: Denies palpitation, chest discomfort Gastrointestinal: Denies nausea, heartburn or change in bowel habits Skin: Denies abnormal skin rashes Lymphatics: Denies new lymphadenopathy or easy bruising Neurological: Denies numbness, tingling or new weaknesses Behavioral/Psych: Mood is  stable, no new changes  Extremities: No lower extremity edema Breast: denies any pain or lumps or nodules in either breasts All other systems were reviewed with the patient and are negative.  I have reviewed the past medical history, past surgical history, social history and family history with the patient and they are unchanged from previous note.  ALLERGIES:  has No Known Allergies.  MEDICATIONS:  Current Outpatient Medications  Medication Sig Dispense Refill  . acetaminophen (TYLENOL) 650 MG CR tablet Take 650 mg by mouth every 8 (eight) hours as needed for pain.    Marland Kitchen amLODipine (NORVASC) 10 MG tablet Take 10 mg by mouth daily.     Marland Kitchen atorvastatin (LIPITOR) 40 MG tablet Take 40 mg by mouth at bedtime.     Marland Kitchen Besifloxacin HCl (BESIVANCE) 0.6 % SUSP Place 1 drop into the right eye See admin instructions. Drop 1 drop in the right eye the day of and the day after eye injection.    . calcium carbonate (OS-CAL) 600 MG TABS Take 600 mg by mouth 2 (two) times daily with a meal.    . Cetirizine HCl (ZYRTEC PO) Take 10 mg by mouth daily.     . cholecalciferol (VITAMIN D) 1000 UNITS tablet Take 1,000 Units by mouth 2 (two) times daily.     . clobetasol cream (TEMOVATE) 1.09 % Apply 1 application topically daily as needed (irritation).     . clopidogrel (PLAVIX) 75 MG tablet Take 75 mg by mouth daily.    Mariane Baumgarten Calcium (STOOL SOFTENER PO) Take 100 mg by mouth 2 (two) times daily.     Marland Kitchen  HYDROcodone-acetaminophen (NORCO) 5-325 MG tablet Take 1-2 tablets by mouth every 6 (six) hours as needed for moderate pain or severe pain. 20 tablet 0  . imipramine (TOFRANIL) 25 MG tablet Take 25 mg by mouth at bedtime.     Marland Kitchen letrozole (FEMARA) 2.5 MG tablet Take 1 tablet (2.5 mg total) by mouth daily. (Patient taking differently: Take 2.5 mg by mouth at bedtime. ) 90 tablet 4  . Multiple Vitamin (MULTIVITAMIN) tablet Take 0.5 tablets by mouth 2 (two) times daily.     Marland Kitchen OVER THE COUNTER MEDICATION Take 1 tablet by  mouth 2 (two) times daily. Viviscal Hair Growth Supplement    . oxybutynin (OXYTROL) 3.9 MG/24HR Place 1 patch onto the skin every 3 (three) days. Pt taking every 5 days    . PAZEO 0.7 % SOLN Place 1 drop into both eyes every morning.     Vladimir Faster Glycol-Propyl Glycol (SYSTANE OP) Apply 1 drop to eye every morning.     . simvastatin (ZOCOR) 10 MG tablet Take 10 mg by mouth 2 (two) times a week.      No current facility-administered medications for this visit.     PHYSICAL EXAMINATION: ECOG PERFORMANCE STATUS: 1 - Symptomatic but completely ambulatory  Vitals:   01/13/19 1105  BP: 125/67  Pulse: 98  Resp: 17  Temp: 98.4 F (36.9 C)  SpO2: 100%   Filed Weights   01/13/19 1105  Weight: 203 lb 1.6 oz (92.1 kg)    GENERAL: alert, no distress and comfortable SKIN: skin color, texture, turgor are normal, no rashes or significant lesions EYES: normal, Conjunctiva are pink and non-injected, sclera clear OROPHARYNX: no exudate, no erythema and lips, buccal mucosa, and tongue normal  NECK: supple, thyroid normal size, non-tender, without nodularity LYMPH: no palpable lymphadenopathy in the cervical, axillary or inguinal LUNGS: clear to auscultation and percussion with normal breathing effort HEART: regular rate & rhythm and no murmurs and no lower extremity edema ABDOMEN: abdomen soft, non-tender and normal bowel sounds MUSCULOSKELETAL: no cyanosis of digits and no clubbing  NEURO: alert & oriented x 3 with fluent speech, no focal motor/sensory deficits EXTREMITIES: No lower extremity edema  LABORATORY DATA:  I have reviewed the data as listed CMP Latest Ref Rng & Units 12/30/2018 12/13/2018 07/09/2014  Glucose 70 - 99 mg/dL 119(H) - 209(H)  BUN 8 - 23 mg/dL 19 - 43(H)  Creatinine 0.44 - 1.00 mg/dL 1.12(H) 1.20(H) 2.45(H)  Sodium 135 - 145 mmol/L 136 - 135  Potassium 3.5 - 5.1 mmol/L 4.2 - 3.7  Chloride 98 - 111 mmol/L 103 - 96  CO2 22 - 32 mmol/L 25 - 28  Calcium 8.9 - 10.3  mg/dL 10.0 - 9.7  Total Protein 6.5 - 8.1 g/dL 7.5 - 7.0  Total Bilirubin 0.3 - 1.2 mg/dL 0.5 - 0.7  Alkaline Phos 38 - 126 U/L 51 - 63  AST 15 - 41 U/L 23 - 23  ALT 0 - 44 U/L 16 - 12    Lab Results  Component Value Date   WBC 5.7 12/30/2018   HGB 12.9 12/30/2018   HCT 41.3 12/30/2018   MCV 86.2 12/30/2018   PLT 218 12/30/2018   NEUTROABS 3.8 12/30/2018    ASSESSMENT & PLAN:  Malignant neoplasm of upper-inner quadrant of right breast in female, estrogen receptor positive ([place]) 01/06/2019:Right lumpectomy: IDC, grade 1, 0.8 cm, margins negative, negative for LV I or PNI, ER 95%, PR 90%, HER-2 negative, Ki-67 5% T1BN0 stage Ia  Pathology counseling: I discussed the final pathology report of the patient provided  a copy of this report. I discussed the margins as well as lymph node surgeries. We also discussed the final staging along with previously performed ER/PR and HER-2/neu testing.  Recommendation: Adjuvant antiestrogen therapy Patient took letrozole prior to surgery and reports that she felt very tired and she thinks it is causing her diarrhea. We will discontinue letrozole and sent for anastrozole.   Return to clinic in 3 months for survivorship care plan visit    No orders of the defined types were placed in this encounter.  The patient has a good understanding of the overall plan. she agrees with it. she will call with any problems that may develop before the next visit here.  Nicholas Lose, MD 01/13/2019  Julious Oka Dorshimer am acting as scribe for Dr. Nicholas Lose.  I have reviewed the above documentation for accuracy and completeness, and I agree with the above.

## 2019-01-13 ENCOUNTER — Other Ambulatory Visit: Payer: Self-pay

## 2019-01-13 ENCOUNTER — Encounter: Payer: Self-pay | Admitting: Hematology and Oncology

## 2019-01-13 ENCOUNTER — Inpatient Hospital Stay: Payer: Medicare Other | Attending: Hematology and Oncology | Admitting: Hematology and Oncology

## 2019-01-13 ENCOUNTER — Telehealth: Payer: Self-pay | Admitting: Adult Health

## 2019-01-13 DIAGNOSIS — C50211 Malignant neoplasm of upper-inner quadrant of right female breast: Secondary | ICD-10-CM | POA: Diagnosis not present

## 2019-01-13 DIAGNOSIS — Z170 Estrogen receptor positive status [ER+]: Secondary | ICD-10-CM | POA: Diagnosis not present

## 2019-01-13 DIAGNOSIS — R197 Diarrhea, unspecified: Secondary | ICD-10-CM

## 2019-01-13 MED ORDER — ANASTROZOLE 1 MG PO TABS: 1.0000 mg | ORAL_TABLET | Freq: Every day | ORAL | 3 refills | Status: DC

## 2019-01-13 NOTE — Assessment & Plan Note (Signed)
01/06/2019:Right lumpectomy: IDC, grade 1, 0.8 cm, margins negative, negative for LV I or PNI, ER 95%, PR 90%, HER-2 negative, Ki-67 5% T1BN0 stage Ia  Pathology counseling: I discussed the final pathology report of the patient provided  a copy of this report. I discussed the margins as well as lymph node surgeries. We also discussed the final staging along with previously performed ER/PR and HER-2/neu testing.  Recommendation: +/-Radiation therapy Adjuvant antiestrogen therapy  Patient may only need either radiation or hormone therapy. Return to clinic in 1 3 months for survivorship care plan visit

## 2019-01-13 NOTE — Progress Notes (Signed)
Patient and family memeber came in to inquire about financial assistance for bills.  Patient not in chem or radiation. Advised patient any assistance would have to be discussed with the billing department. Provided information on [employer] and the [place] settlement and gave application. Advised of the required balance in order to apply through [facility] and gave list of what is needed.They verbalized understanding.

## 2019-01-13 NOTE — Telephone Encounter (Signed)
Gave avs and calendar ° °

## 2019-03-17 ENCOUNTER — Encounter (INDEPENDENT_AMBULATORY_CARE_PROVIDER_SITE_OTHER): Payer: Medicare Other | Admitting: Ophthalmology

## 2019-03-17 ENCOUNTER — Other Ambulatory Visit: Payer: Self-pay

## 2019-03-17 DIAGNOSIS — H43813 Vitreous degeneration, bilateral: Secondary | ICD-10-CM

## 2019-03-17 DIAGNOSIS — I10 Essential (primary) hypertension: Secondary | ICD-10-CM | POA: Diagnosis not present

## 2019-03-17 DIAGNOSIS — E11319 Type 2 diabetes mellitus with unspecified diabetic retinopathy without macular edema: Secondary | ICD-10-CM | POA: Diagnosis not present

## 2019-03-17 DIAGNOSIS — E113393 Type 2 diabetes mellitus with moderate nonproliferative diabetic retinopathy without macular edema, bilateral: Secondary | ICD-10-CM

## 2019-03-17 DIAGNOSIS — H35033 Hypertensive retinopathy, bilateral: Secondary | ICD-10-CM

## 2019-03-17 DIAGNOSIS — H348312 Tributary (branch) retinal vein occlusion, right eye, stable: Secondary | ICD-10-CM | POA: Diagnosis not present

## 2019-04-13 ENCOUNTER — Telehealth: Payer: Self-pay | Admitting: Adult Health

## 2019-04-13 NOTE — Telephone Encounter (Signed)
I left patient a message regarding phone visit also if she had the capability of doing a video visit

## 2019-04-28 ENCOUNTER — Other Ambulatory Visit: Payer: Self-pay

## 2019-04-28 ENCOUNTER — Encounter (INDEPENDENT_AMBULATORY_CARE_PROVIDER_SITE_OTHER): Payer: Medicare Other | Admitting: Ophthalmology

## 2019-04-28 DIAGNOSIS — H35033 Hypertensive retinopathy, bilateral: Secondary | ICD-10-CM | POA: Diagnosis not present

## 2019-04-28 DIAGNOSIS — I10 Essential (primary) hypertension: Secondary | ICD-10-CM | POA: Diagnosis not present

## 2019-04-28 DIAGNOSIS — H43813 Vitreous degeneration, bilateral: Secondary | ICD-10-CM

## 2019-04-28 DIAGNOSIS — H348312 Tributary (branch) retinal vein occlusion, right eye, stable: Secondary | ICD-10-CM

## 2019-04-28 DIAGNOSIS — H2513 Age-related nuclear cataract, bilateral: Secondary | ICD-10-CM

## 2019-05-03 ENCOUNTER — Other Ambulatory Visit: Payer: Self-pay | Admitting: Internal Medicine

## 2019-05-03 DIAGNOSIS — E2839 Other primary ovarian failure: Secondary | ICD-10-CM

## 2019-05-04 ENCOUNTER — Encounter: Payer: Self-pay | Admitting: Adult Health

## 2019-05-04 ENCOUNTER — Telehealth: Payer: Self-pay | Admitting: Hematology and Oncology

## 2019-05-04 ENCOUNTER — Inpatient Hospital Stay: Payer: Medicare Other | Attending: Adult Health | Admitting: Adult Health

## 2019-05-04 DIAGNOSIS — C50211 Malignant neoplasm of upper-inner quadrant of right female breast: Secondary | ICD-10-CM

## 2019-05-04 DIAGNOSIS — Z170 Estrogen receptor positive status [ER+]: Secondary | ICD-10-CM | POA: Diagnosis not present

## 2019-05-04 DIAGNOSIS — Z79811 Long term (current) use of aromatase inhibitors: Secondary | ICD-10-CM

## 2019-05-04 NOTE — Telephone Encounter (Signed)
I talk with patient regarding schedule  

## 2019-05-04 NOTE — Progress Notes (Signed)
SURVIVORSHIP VIRTUAL VISIT:  I connected with Carolyn Weber on 05/04/19 at  2:00 PM EDT by telephone and verified that I am speaking with the correct person using two identifiers.   I discussed the limitations, risks, security and privacy concerns of performing an evaluation and management service by telephone and the availability of in person appointments. I also discussed with the patient that there may be a patient responsible charge related to this service. The patient expressed understanding and agreed to proceed.   BRIEF ONCOLOGIC HISTORY:  Oncology History  Malignant neoplasm of upper-inner quadrant of right breast in female, estrogen receptor positive (Carolyn Weber)  09/21/2018 Initial Diagnosis   Screening mammogram detected right breast mass 2:30 position.  3 cm from nipple 0.9 x 0.6 x 0.8 cm, ultrasound axilla negative biopsy revealed grade 1-2 IDC ER 95%, PR 90%, HER-2 1+ negative, Ki-67 5%, T1b N0 stage Ia clinical stage   10/10/2018 Cancer Staging   Staging form: Breast, AJCC 8th Edition - Clinical: Stage IA (cT1b, cN0, cM0, G2, ER+, PR+, HER2-) - Signed by Nicholas Lose, MD on 10/10/2018   10/28/2018 Genetic Testing   Genetic testing showed no pathogenic mutations. 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  Genes tested include: AIP, ALK, APC, ATM, AXIN2,BAP1,  BARD1, BLM, BMPR1A, BRCA1, BRCA2, BRIP1, CASR, CDC73, CDH1, CDK4, CDKN1B, CDKN1C, CDKN2A (p14ARF), CDKN2A (p16INK4a), CEBPA, CHEK2, CTNNA1, DICER1, DIS3L2, EGFR (c.2369C>T, p.Thr790Met variant only), EPCAM (Deletion/duplication testing only), FH, FLCN, GATA2, GPC3, GREM1 (Promoter region deletion/duplication testing only), HOXB13 (c.251G>A, p.Gly84Glu), HRAS, KIT, MAX, MEN1, MET, MITF (c.952G>A, p.Glu318Lys variant only), MLH1, MSH2, MSH3, MSH6, MUTYH, NBN, NF1, NF2, NTHL1, PALB2, PDGFRA, PHOX2B, PMS2, POLD1, POLE, POT1, PRKAR1A, PTCH1, PTEN, RAD50, RAD51C, RAD51D, RB1, RECQL4, RET, RUNX1, SDHAF2, SDHA  (sequence changes only), SDHB, SDHC, SDHD, SMAD4, SMARCA4, SMARCB1, SMARCE1, STK11, SUFU, TERC, TERT, TMEM127, TP53, TSC1, TSC2, VHL, WRN and WT1.     01/06/2019 Surgery   Right lumpectomy: IDC, grade 1, 0.8 cm, margins negative, negative for LV I or PNI, ER 95%, PR 90%, HER-2 negative, Ki-67 5% T1BN0 stage Ia   01/06/2019 Cancer Staging   Staging form: Breast, AJCC 8th Edition - Pathologic stage from 01/06/2019: Stage IA (pT1b, pN0, cM0, G1, ER+, PR+, HER2-) - Signed by Gardenia Phlegm, NP on 01/25/2019   02/2019 -  Anti-estrogen oral therapy   Anastrozole daily     INTERVAL HISTORY:  Carolyn Weber to review her survivorship care plan detailing her treatment course for breast cancer, as well as monitoring long-term side effects of that treatment, education regarding health maintenance, screening, and overall wellness and health promotion.     Overall, Carolyn Weber reports feeling quite well.  Carolyn Weber is taking Anastrozole daily.  There are some days she is fatigued.  She is unsure if this is related to the Anastrozole or the pandemic precautions.  She is discouraged because she cannot go back to work because of the virus.  She works in home care.  She was recommended not to return to work by Dr. Dalbert Batman due to the coronavirus and her risk of complications from COVID 19.    REVIEW OF SYSTEMS:  Review of Systems - Oncology Breast: Denies any new nodularity, masses, tenderness, nipple changes, or nipple discharge.      ONCOLOGY TREATMENT TEAM:  1. Surgeon:  Dr. Dalbert Batman at [facility] 2. Medical Oncologist: Dr. [name]      PAST MEDICAL/SURGICAL HISTORY:  Past Medical History:  Diagnosis Date  .  Allergy   . Arthritis   . Cancer ([place]) 10/2018   breast right, receptor positive  . Chronic kidney disease    Stage 2-3  . Colon polyps   . Complication of anesthesia    Hard to wake up  . 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 Difficulty in urination   . Diverticulitis   .  Family history of breast cancer   . Family history of lung cancer   . Family history of prostate cancer   . Hyperlipemia   . Hypertension   . Obesity   . Pre-diabetes   . TIA (transient ischemic attack)    Past Surgical History:  Procedure Laterality Date  . BREAST EXCISIONAL BIOPSY Right    about 30 years ago.   Marland Kitchen BREAST LUMPECTOMY WITH RADIOACTIVE SEED LOCALIZATION Right 01/06/2019   Procedure: RIGHT BREAST LUMPECTOMY WITH RADIOACTIVE SEED LOCALIZATION;  Surgeon: Fanny Skates, MD;  Location: [facility];  Service: General;  Laterality: Right;  . BREAST SURGERY     breast biospy  . calcium removal from breast    . COLONOSCOPY    . right breast biopsy Right 09/21/2018  . urinary sling       ALLERGIES:  No Known Allergies   CURRENT MEDICATIONS:  Outpatient Encounter Medications as of 05/04/2019  Medication Sig Note  . acetaminophen (TYLENOL) 650 MG CR tablet Take 650 mg by mouth every 8 (eight) hours as needed for pain.   Marland Kitchen amLODipine (NORVASC) 10 MG tablet Take 10 mg by mouth daily.    Marland Kitchen anastrozole (ARIMIDEX) 1 MG tablet Take 1 tablet (1 mg total) by mouth daily.   Marland Kitchen atorvastatin (LIPITOR) 40 MG tablet Take 40 mg by mouth at bedtime.    Marland Kitchen Besifloxacin HCl (BESIVANCE) 0.6 % SUSP Place 1 drop into the right eye See admin instructions. Drop 1 drop in the right eye the day of and the day after eye injection.   . calcium carbonate (OS-CAL) 600 MG TABS Take 600 mg by mouth 2 (two) times daily with a meal.   . Cetirizine HCl (ZYRTEC PO) Take 10 mg by mouth daily.    . cholecalciferol (VITAMIN D) 1000 UNITS tablet Take 1,000 Units by mouth 2 (two) times daily.    . clobetasol cream (TEMOVATE) 4.65 % Apply 1 application topically daily as needed (irritation).    . clopidogrel (PLAVIX) 75 MG tablet Take 75 mg by mouth daily.   Marland Kitchen imipramine (TOFRANIL) 25 MG tablet Take 25 mg by mouth at bedtime.    . Multiple Vitamin (MULTIVITAMIN) tablet Take 0.5 tablets by mouth 2 (two) times daily.    Marland Kitchen  OVER THE COUNTER MEDICATION Take 1 tablet by mouth 2 (two) times daily. Viviscal Hair Growth Supplement   . oxybutynin (OXYTROL) 3.9 MG/24HR Place 1 patch onto the skin every 3 (three) days. Pt taking every 5 days 01/06/2019: Applied to Right Hip this am  . PAZEO 0.7 % SOLN Place 1 drop into both eyes every morning.    Vladimir Faster Glycol-Propyl Glycol (SYSTANE OP) Apply 1 drop to eye every morning.    . simvastatin (ZOCOR) 10 MG tablet Take 10 mg by mouth 2 (two) times a week.     No facility-administered encounter medications on file as of 05/04/2019.      ONCOLOGIC FAMILY HISTORY:  Family History  Problem Relation Age of Onset  . Breast cancer Mother        27s  . Diabetes Mother   .  Heart disease Mother   . Cancer Mother   . Hyperlipidemia Mother   . Hypertension Mother   . Stroke Mother   . Prostate cancer Father        18s  . Diabetes Father   . Heart disease Father   . Hyperlipidemia Father   . Hypertension Father   . Breast cancer Brother 61  . Prostate cancer Brother        17s  . Colon polyps Brother   . Breast cancer Maternal Aunt        55s  . Prostate cancer Maternal Uncle   . Lung cancer Paternal Grandmother   . Breast cancer Maternal Aunt        74s  . Breast cancer Cousin      GENETIC COUNSELING/TESTING: See above  SOCIAL HISTORY:  Social History   Socioeconomic History  . Marital status: Divorced    Spouse name: Not on file  . Number of children: 2  . Years of education: Not on file  . Highest education level: Bachelor's degree (e.g., BA, AB, BS)  Occupational History  . 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 Financial resource strain: Not on file  . Food insecurity    Worry: Not on file    Inability: Not on file  . Transportation needs    Medical: No    Non-medical: No  Tobacco Use  . Smoking status: Former Smoker    Quit date: 11/02/1978    Years since quitting: 40.5  . Smokeless tobacco: Never Used   Substance and Sexual Activity  . Alcohol use: No  . Drug use: Never  . Sexual activity: Not on file  Lifestyle  . Physical activity    Days per week: Not on file    Minutes per session: Not on file  . Stress: Not on file  Relationships  . Social Herbalist on phone: Not on file    Gets together: Not on file    Attends religious service: Not on file    Active member of club or organization: Not on file    Attends meetings of clubs or organizations: Not on file    Relationship status: Not on file  . Intimate partner violence    Fear of current or ex partner: Not on file    Emotionally abused: Not on file    Physically abused: Not on file    Forced sexual activity: Not on file  Other Topics Concern  . Not on file  Social History Narrative   Lives alone   No caffiene     OBSERVATIONS/OBJECTIVE:   LABORATORY DATA:  None for this visit.  DIAGNOSTIC IMAGING:  None for this visit.      ASSESSMENT AND PLAN:  Ms.. Weber is a pleasant 77 y.o. female with Stage IA right breast invasive ductal carcinoma, ER+/PR+/HER2-, diagnosed in 09/2018, treated with lumpectomy  and anti-estrogen therapy with Anastrozole beginning in 02/2019.  She presents to the Survivorship Clinic for our initial meeting and routine follow-up post-completion of treatment for breast cancer.    1. Stage IA right breast cancer:  Carolyn Weber is continuing to recover from definitive treatment for breast cancer. She will follow-up with her medical oncologist, Dr. [name] in 09/2019 with history and physical exam per surveillance protocol.  She will continue her anti-estrogen therapy with Anastrozole. Thus far, she is tolerating the Anastrozole well, with minimal side effects. She was  instructed to make Dr. [name] or myself aware if she begins to experience any worsening side effects of the medication and I could see her back in clinic to help manage those side effects, as needed. Her mammogram is due 09/2019;  orders placed today. Today, a comprehensive survivorship care plan and treatment summary was reviewed with the patient today detailing her breast cancer diagnosis, treatment course, potential late/long-term effects of treatment, appropriate follow-up care with recommendations for the future, and patient education resources.  A copy of this summary, along with a letter will be sent to the patient's primary care provider via mail/fax/In Basket message after today's visit.    2. Bone health:  Given Carolyn Weber's age/history of breast cancer and her current treatment regimen including anti-estrogen therapy with Anastrozole, she is at risk for bone demineralization.She undergoes bone density testing with Dr. Delfina Weber.  She is unsure when this was done last, or when it should be done again.  In the meantime, she was encouraged to increase her consumption of foods rich in calcium, as well as increase her weight-bearing activities.  She was given education on specific activities to promote bone health.  3. Cancer screening:  Due to Carolyn Weber's history and her age, she should receive screening for skin cancers, colon cancer, and gynecologic cancers.  The information and recommendations are listed on the patient's comprehensive care plan/treatment summary and were reviewed in detail with the patient.    4. Health maintenance and wellness promotion: Carolyn Weber was encouraged to consume 5-7 servings of fruits and vegetables per day. We reviewed the "Nutrition Rainbow" handout, as well as the handout "Take Control of Your Health and Reduce Your Cancer Risk" from the [employer].  She was also encouraged to engage in moderate to vigorous exercise for 30 minutes per day most days of the week. We discussed the [employer] fitness program, which is designed for cancer survivors to help them become more physically fit after cancer treatments.  She was instructed to limit her alcohol consumption and continue to abstain  from tobacco use.     5. Support services/counseling: It is not uncommon for this period of the patient's cancer care trajectory to be one of many emotions and stressors.  We discussed how this can be increasingly difficult during the times of quarantine and social distancing due to the COVID-19 pandemic.   She was given information regarding our available services and encouraged to contact me with any questions or for help enrolling in any of our support group/programs.    Follow up instructions:    -Return to cancer center in 09/2019 Dr. [name]  -Mammogram due in 09/2019 -Bone Density testing per Dr. Delfina Weber -She is welcome to return back to the Survivorship Clinic at any time; no additional follow-up needed at this time.  -Consider referral back to survivorship as a long-term survivor for continued surveillance  The patient was provided an opportunity to ask questions and all were answered. The patient agreed with the plan and demonstrated an understanding of the instructions.   The patient was advised to call back or seek an in-person evaluation if the symptoms worsen or if the condition fails to improve as anticipated.   I provided 35 minutes of non face-to-face telephone visit time during this encounter, and > 50% was spent counseling as documented under my assessment & plan.  Scot Dock, NP

## 2019-06-30 ENCOUNTER — Other Ambulatory Visit: Payer: Self-pay

## 2019-06-30 ENCOUNTER — Encounter (INDEPENDENT_AMBULATORY_CARE_PROVIDER_SITE_OTHER): Payer: Medicare Other | Admitting: Ophthalmology

## 2019-06-30 DIAGNOSIS — H43813 Vitreous degeneration, bilateral: Secondary | ICD-10-CM

## 2019-06-30 DIAGNOSIS — I10 Essential (primary) hypertension: Secondary | ICD-10-CM | POA: Diagnosis not present

## 2019-06-30 DIAGNOSIS — H34831 Tributary (branch) retinal vein occlusion, right eye, with macular edema: Secondary | ICD-10-CM | POA: Diagnosis not present

## 2019-06-30 DIAGNOSIS — H35033 Hypertensive retinopathy, bilateral: Secondary | ICD-10-CM | POA: Diagnosis not present

## 2019-09-01 ENCOUNTER — Encounter (INDEPENDENT_AMBULATORY_CARE_PROVIDER_SITE_OTHER): Payer: Medicare Other | Admitting: Ophthalmology

## 2019-09-01 ENCOUNTER — Other Ambulatory Visit: Payer: Self-pay

## 2019-09-01 DIAGNOSIS — H43813 Vitreous degeneration, bilateral: Secondary | ICD-10-CM | POA: Diagnosis not present

## 2019-09-01 DIAGNOSIS — H34831 Tributary (branch) retinal vein occlusion, right eye, with macular edema: Secondary | ICD-10-CM | POA: Diagnosis not present

## 2019-09-01 DIAGNOSIS — I10 Essential (primary) hypertension: Secondary | ICD-10-CM

## 2019-09-01 DIAGNOSIS — H35033 Hypertensive retinopathy, bilateral: Secondary | ICD-10-CM

## 2019-09-15 ENCOUNTER — Ambulatory Visit
Admission: RE | Admit: 2019-09-15 | Discharge: 2019-09-15 | Disposition: A | Discharge status: Home / Self Care | Payer: Medicare Other | Source: Ambulatory Visit | Attending: Adult Health | Admitting: Adult Health

## 2019-09-15 ENCOUNTER — Other Ambulatory Visit: Payer: Self-pay

## 2019-09-15 DIAGNOSIS — C50211 Malignant neoplasm of upper-inner quadrant of right female breast: Secondary | ICD-10-CM

## 2019-09-15 DIAGNOSIS — Z170 Estrogen receptor positive status [ER+]: Secondary | ICD-10-CM

## 2019-10-10 ENCOUNTER — Encounter: Payer: Self-pay | Admitting: *Deleted

## 2019-10-11 ENCOUNTER — Ambulatory Visit
Admission: RE | Admit: 2019-10-11 | Discharge: 2019-10-11 | Disposition: A | Discharge status: Home / Self Care | Payer: Medicare Other | Source: Ambulatory Visit | Attending: Internal Medicine | Admitting: Internal Medicine

## 2019-10-11 ENCOUNTER — Other Ambulatory Visit: Payer: Self-pay

## 2019-10-11 DIAGNOSIS — E2839 Other primary ovarian failure: Secondary | ICD-10-CM

## 2019-11-05 NOTE — Progress Notes (Signed)
Patient Care Team: Seward Carol, MD as PCP - General (Internal Medicine) Fanny Skates, MD as Consulting Physician (General Surgery) Nicholas Lose, MD as Consulting Physician (Hematology and Oncology)  DIAGNOSIS:    ICD-10-CM   1. Malignant neoplasm of upper-inner quadrant of right breast in female, estrogen receptor positive ([place])  C50.211    Z17.0     SUMMARY OF ONCOLOGIC HISTORY: Oncology History  Malignant neoplasm of upper-inner quadrant of right breast in female, estrogen receptor positive (Carolyn Weber)  09/21/2018 Initial Diagnosis   Screening mammogram detected right breast mass 2:30 position.  3 cm from nipple 0.9 x 0.6 x 0.8 cm, ultrasound axilla negative biopsy revealed grade 1-2 IDC ER 95%, PR 90%, HER-2 1+ negative, Ki-67 5%, T1b N0 stage Ia clinical stage   10/10/2018 Cancer Staging   Staging form: Breast, AJCC 8th Edition - Clinical: Stage IA (cT1b, cN0, cM0, G2, ER+, PR+, HER2-) - Signed by Nicholas Lose, MD on 10/10/2018   10/28/2018 Genetic Testing   Genetic testing showed no pathogenic mutations. 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  Genes tested include: AIP, ALK, APC, ATM, AXIN2,BAP1,  BARD1, BLM, BMPR1A, BRCA1, BRCA2, BRIP1, CASR, CDC73, CDH1, CDK4, CDKN1B, CDKN1C, CDKN2A (p14ARF), CDKN2A (p16INK4a), CEBPA, CHEK2, CTNNA1, DICER1, DIS3L2, EGFR (c.2369C>T, p.Thr790Met variant only), EPCAM (Deletion/duplication testing only), FH, FLCN, GATA2, GPC3, GREM1 (Promoter region deletion/duplication testing only), HOXB13 (c.251G>A, p.Gly84Glu), HRAS, KIT, MAX, MEN1, MET, MITF (c.952G>A, p.Glu318Lys variant only), MLH1, MSH2, MSH3, MSH6, MUTYH, NBN, NF1, NF2, NTHL1, PALB2, PDGFRA, PHOX2B, PMS2, POLD1, POLE, POT1, PRKAR1A, PTCH1, PTEN, RAD50, RAD51C, RAD51D, RB1, RECQL4, RET, RUNX1, SDHAF2, SDHA (sequence changes only), SDHB, SDHC, SDHD, SMAD4, SMARCA4, SMARCB1, SMARCE1, STK11, SUFU, TERC, TERT, TMEM127, TP53, TSC1, TSC2, VHL, WRN and WT1.     01/06/2019 Surgery   Right lumpectomy: IDC, grade 1, 0.8 cm, margins negative, negative for LV I or PNI, ER 95%, PR 90%, HER-2 negative, Ki-67 5% T1BN0 stage Ia   01/06/2019 Cancer Staging   Staging form: Breast, AJCC 8th Edition - Pathologic stage from 01/06/2019: Stage IA (pT1b, pN0, cM0, G1, ER+, PR+, HER2-) - Signed by Gardenia Phlegm, NP on 01/25/2019   02/2019 -  Anti-estrogen oral therapy   Anastrozole daily     CHIEF COMPLIANT: Follow-up of right breast cancer on anastrozole  INTERVAL HISTORY: Carolyn Weber is a 79 y.o. with above-mentioned history of right breast cancer who underwent a lumpectomy and is currently on antiestrogen therapy with anastrozole. Mammogram on 09/15/19 showed no evidence of malignancy bilaterally. Bone density scan on 10/11/19 showed normal density with a T-score of -0.5 at the AP spine. She presents to the clinic today for follow-up.     ALLERGIES:  has No Known Allergies.  MEDICATIONS:  Current Outpatient Medications  Medication Sig Dispense Refill  . acetaminophen (TYLENOL) 650 MG CR tablet Take 650 mg by mouth every 8 (eight) hours as needed for pain.    Marland Kitchen amLODipine (NORVASC) 10 MG tablet Take 10 mg by mouth daily.     Marland Kitchen anastrozole (ARIMIDEX) 1 MG tablet Take 1 tablet (1 mg total) by mouth daily. 90 tablet 3  . atorvastatin (LIPITOR) 40 MG tablet Take 40 mg by mouth at bedtime.     Marland Kitchen Besifloxacin HCl (BESIVANCE) 0.6 % SUSP Place 1 drop into the right eye See admin instructions. Drop 1 drop in the right eye the day of and the day after eye injection.    . calcium carbonate (OS-CAL) 600  MG TABS Take 600 mg by mouth 2 (two) times daily with a meal.    . Cetirizine HCl (ZYRTEC PO) Take 10 mg by mouth daily.     . cholecalciferol (VITAMIN D) 1000 UNITS tablet Take 1,000 Units by mouth 2 (two) times daily.     . clobetasol cream (TEMOVATE) 6.54 % Apply 1 application topically daily as needed (irritation).     . clopidogrel (PLAVIX) 75 MG tablet Take  75 mg by mouth daily.    Marland Kitchen imipramine (TOFRANIL) 25 MG tablet Take 25 mg by mouth at bedtime.     . Multiple Vitamin (MULTIVITAMIN) tablet Take 0.5 tablets by mouth 2 (two) times daily.     Marland Kitchen OVER THE COUNTER MEDICATION Take 1 tablet by mouth 2 (two) times daily. Viviscal Hair Growth Supplement    . oxybutynin (OXYTROL) 3.9 MG/24HR Place 1 patch onto the skin every 3 (three) days. Pt taking every 5 days    . PAZEO 0.7 % SOLN Place 1 drop into both eyes every morning.     Vladimir Faster Glycol-Propyl Glycol (SYSTANE OP) Apply 1 drop to eye every morning.     . simvastatin (ZOCOR) 10 MG tablet Take 10 mg by mouth 2 (two) times a week.      No current facility-administered medications for this visit.    PHYSICAL EXAMINATION: ECOG PERFORMANCE STATUS: 1 - Symptomatic but completely ambulatory  Vitals:   11/06/19 1058  BP: (!) 150/72  Pulse: 91  Resp: 18  Temp: 97.8 F (36.6 C)  SpO2: 100%   Filed Weights   11/06/19 1058  Weight: 201 lb 3.2 oz (91.3 kg)      LABORATORY DATA:  I have reviewed the data as listed CMP Latest Ref Rng & Units 12/30/2018 12/13/2018 07/09/2014  Glucose 70 - 99 mg/dL 119(H) - 209(H)  BUN 8 - 23 mg/dL 19 - 43(H)  Creatinine 0.44 - 1.00 mg/dL 1.12(H) 1.20(H) 2.45(H)  Sodium 135 - 145 mmol/L 136 - 135  Potassium 3.5 - 5.1 mmol/L 4.2 - 3.7  Chloride 98 - 111 mmol/L 103 - 96  CO2 22 - 32 mmol/L 25 - 28  Calcium 8.9 - 10.3 mg/dL 10.0 - 9.7  Total Protein 6.5 - 8.1 g/dL 7.5 - 7.0  Total Bilirubin 0.3 - 1.2 mg/dL 0.5 - 0.7  Alkaline Phos 38 - 126 U/L 51 - 63  AST 15 - 41 U/L 23 - 23  ALT 0 - 44 U/L 16 - 12    Lab Results  Component Value Date   WBC 5.7 12/30/2018   HGB 12.9 12/30/2018   HCT 41.3 12/30/2018   MCV 86.2 12/30/2018   PLT 218 12/30/2018   NEUTROABS 3.8 12/30/2018    ASSESSMENT & PLAN:  Malignant neoplasm of upper-inner quadrant of right breast in female, estrogen receptor positive ([place]) 01/06/2019:Right lumpectomy: IDC, grade 1, 0.8 cm,  margins negative, negative for LV I or PNI, ER 95%, PR 90%, HER-2 negative, Ki-67 5% T1BN0 stage Ia Given the favorable prognostic features and her age, radiation was omitted Current treatment: Anastrozole started 02/20/2019  Anastrozole toxicities:  Breast cancer surveillance: 1.  Mammogram 09/15/2019: Benign, breast density category C 2.  Breast exam 11/06/2019: Benign 3.  Bone density 10/11/2019: T score -0.5: Normal  RTC in 1 year     No orders of the defined types were placed in this encounter.  The patient has a good understanding of the overall plan. she agrees with it. she will call with any  problems that may develop before the next visit here.  Nicholas Lose, MD 11/06/2019  Julious Oka Dorshimer, am acting as scribe for Dr. Nicholas Lose.  I have reviewed the above document for accuracy and completeness, and I agree with the above.

## 2019-11-06 ENCOUNTER — Inpatient Hospital Stay: Payer: Medicare Other | Attending: Hematology and Oncology | Admitting: Hematology and Oncology

## 2019-11-06 ENCOUNTER — Other Ambulatory Visit: Payer: Self-pay

## 2019-11-06 ENCOUNTER — Telehealth: Payer: Self-pay | Admitting: Hematology and Oncology

## 2019-11-06 DIAGNOSIS — Z79811 Long term (current) use of aromatase inhibitors: Secondary | ICD-10-CM | POA: Diagnosis not present

## 2019-11-06 DIAGNOSIS — C50211 Malignant neoplasm of upper-inner quadrant of right female breast: Secondary | ICD-10-CM | POA: Diagnosis present

## 2019-11-06 DIAGNOSIS — Z170 Estrogen receptor positive status [ER+]: Secondary | ICD-10-CM | POA: Insufficient documentation

## 2019-11-06 MED ORDER — IMIPRAMINE HCL 50 MG PO TABS: 50.0000 mg | ORAL_TABLET | Freq: Every day | ORAL | Status: DC

## 2019-11-06 MED ORDER — ANASTROZOLE 1 MG PO TABS: 1.0000 mg | ORAL_TABLET | Freq: Every day | ORAL | 3 refills | Status: DC

## 2019-11-06 NOTE — Assessment & Plan Note (Signed)
01/06/2019:Right lumpectomy: IDC, grade 1, 0.8 cm, margins negative, negative for LV I or PNI, ER 95%, PR 90%, HER-2 negative, Ki-67 5% T1BN0 stage Ia Given the favorable prognostic features and her age, radiation was omitted Current treatment: Anastrozole started 02/20/2019  Anastrozole toxicities:  Breast cancer surveillance: 1.  Mammogram 09/15/2019: Benign, breast density category C 2.  Breast exam 11/06/2019: Benign 3.  Bone density 10/11/2019: T score -0.5: Normal  RTC in 1 year

## 2019-11-06 NOTE — Telephone Encounter (Signed)
I could not leave a voicemail regarding schedule it did sound as if it came on

## 2019-11-20 ENCOUNTER — Ambulatory Visit: Payer: Medicare Other

## 2019-11-21 ENCOUNTER — Ambulatory Visit: Payer: Medicare Other | Attending: Internal Medicine

## 2019-11-21 DIAGNOSIS — Z23 Encounter for immunization: Secondary | ICD-10-CM | POA: Insufficient documentation

## 2019-11-21 NOTE — Progress Notes (Signed)
   Covid-19 Vaccination Clinic  Name:  Carolyn Weber    MRN: [id number] DOB: 24-Feb-1941  11/21/2019  Carolyn Weber was observed post Covid-19 immunization for 15 minutes without incidence. She was provided with Vaccine Information Sheet and instruction to access the V-Safe system.   Carolyn Weber was instructed to call 911 with any severe reactions post vaccine: Marland Kitchen Difficulty breathing  . Swelling of your face and throat  . A fast heartbeat  . A bad rash all over your body  . Dizziness and weakness    Immunizations Administered    Name Date Dose VIS Date Route   Pfizer COVID-19 Vaccine 11/21/2019  1:20 PM 0.3 mL 10/13/2019 Intramuscular   Manufacturer: [place]   Lot: [id number]   [place]: [id number]

## 2019-11-24 ENCOUNTER — Encounter (INDEPENDENT_AMBULATORY_CARE_PROVIDER_SITE_OTHER): Payer: Medicare Other | Admitting: Ophthalmology

## 2019-11-24 DIAGNOSIS — I10 Essential (primary) hypertension: Secondary | ICD-10-CM

## 2019-11-24 DIAGNOSIS — H35033 Hypertensive retinopathy, bilateral: Secondary | ICD-10-CM | POA: Diagnosis not present

## 2019-11-24 DIAGNOSIS — H2513 Age-related nuclear cataract, bilateral: Secondary | ICD-10-CM

## 2019-11-24 DIAGNOSIS — H43813 Vitreous degeneration, bilateral: Secondary | ICD-10-CM | POA: Diagnosis not present

## 2019-11-24 DIAGNOSIS — H34831 Tributary (branch) retinal vein occlusion, right eye, with macular edema: Secondary | ICD-10-CM

## 2019-12-12 ENCOUNTER — Ambulatory Visit: Payer: Medicare Other | Attending: Internal Medicine

## 2019-12-12 DIAGNOSIS — Z23 Encounter for immunization: Secondary | ICD-10-CM | POA: Insufficient documentation

## 2019-12-12 NOTE — Progress Notes (Signed)
   Covid-19 Vaccination Clinic  Name:  Carolyn Weber    MRN: [id number] DOB: 09/26/1941  12/12/2019  Carolyn Weber was observed post Covid-19 immunization for 15 minutes without incidence. She was provided with Vaccine Information Sheet and instruction to access the V-Safe system.   Carolyn Weber was instructed to call 911 with any severe reactions post vaccine: Marland Kitchen Difficulty breathing  . Swelling of your face and throat  . A fast heartbeat  . A bad rash all over your body  . Dizziness and weakness    Immunizations Administered    Name Date Dose VIS Date Route   Pfizer COVID-19 Vaccine 12/12/2019 12:59 PM 0.3 mL 10/13/2019 Intramuscular   Manufacturer: [place]   Lot: [id number]   [place]: [id number]

## 2020-02-16 ENCOUNTER — Encounter (INDEPENDENT_AMBULATORY_CARE_PROVIDER_SITE_OTHER): Payer: Medicare Other | Admitting: Ophthalmology

## 2020-02-16 DIAGNOSIS — H43813 Vitreous degeneration, bilateral: Secondary | ICD-10-CM | POA: Diagnosis not present

## 2020-02-16 DIAGNOSIS — H35033 Hypertensive retinopathy, bilateral: Secondary | ICD-10-CM

## 2020-02-16 DIAGNOSIS — H34831 Tributary (branch) retinal vein occlusion, right eye, with macular edema: Secondary | ICD-10-CM | POA: Diagnosis not present

## 2020-02-16 DIAGNOSIS — I10 Essential (primary) hypertension: Secondary | ICD-10-CM | POA: Diagnosis not present

## 2020-02-16 DIAGNOSIS — H2513 Age-related nuclear cataract, bilateral: Secondary | ICD-10-CM

## 2020-03-06 IMAGING — MR MR HEAD WO/W CM
10 of 13 series · 34 of 48 positions shown · IV contrast (gadavist)
Comparison: 10/12/2018

CLINICAL DATA: History of breast cancer. Abnormal appearance of the
left pons on the previous examination. Follow-up for differentiation
of stroke versus tumor.

EXAM:
MRI HEAD WITHOUT AND WITH CONTRAST
TECHNIQUE: Multiplanar, multiecho pulse sequences of the brain and surrounding
structures were obtained without and with intravenous contrast.
CONTRAST:  10 cc Gadavist

[Series 3: DWI · axial · 3.0mm · 1.09mm/px · z∈[-44,+116]mm · 8 of 110 slices shown (1 of 4)]
[im 1/110]
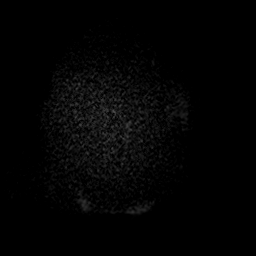
[im 13/110]
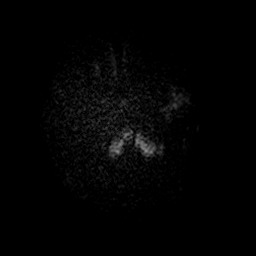
[im 37/110]
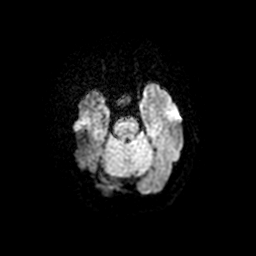
[im 49/110]
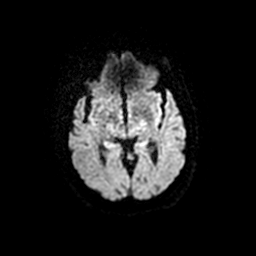
[im 61/110]
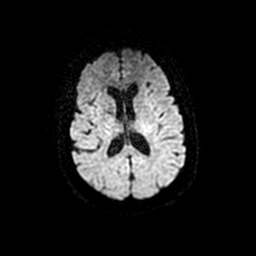
[im 73/110]
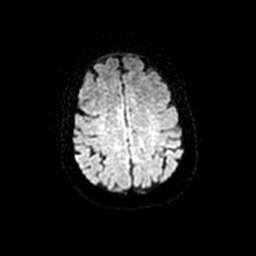
[im 97/110]
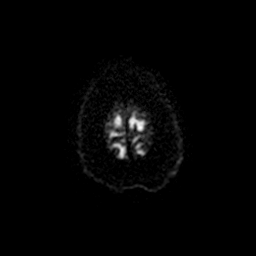
[im 110/110]
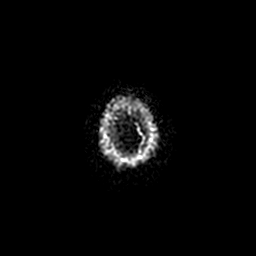

[Series 4: T1 · sagittal · 5.0mm · 0.47mm/px · 2 of 24 slices shown]
[im 1/24]
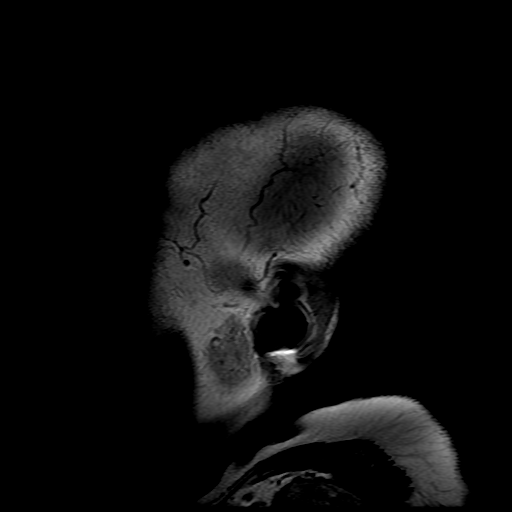
[im 24/24]
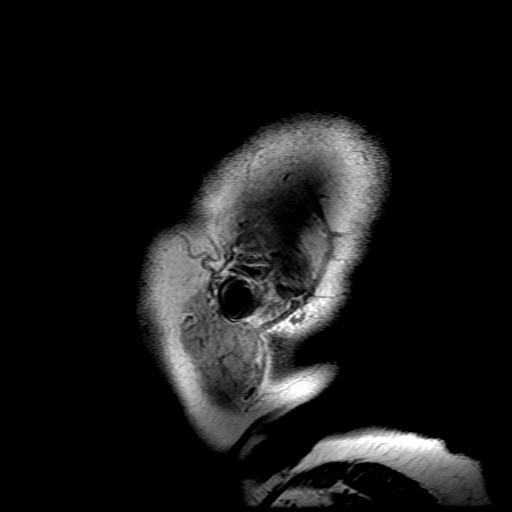

[Series 5: DWI · coronal · 4.0mm · 1.09mm/px · 7 of 84 slices shown (2 of 4)]
[im 1/84]
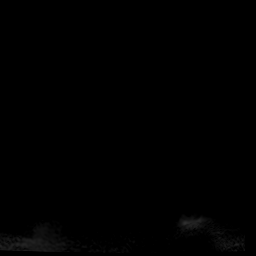
[im 14/84]
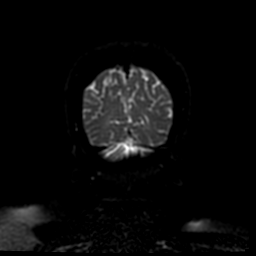
[im 28/84]
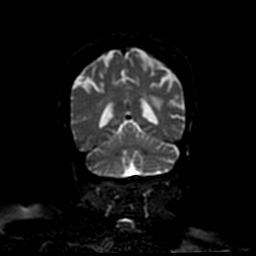
[im 42/84]
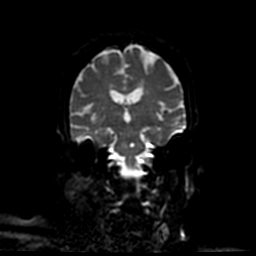
[im 56/84]
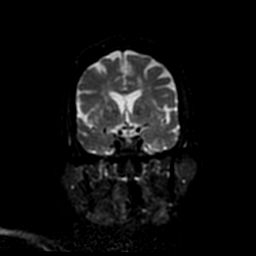
[im 70/84]
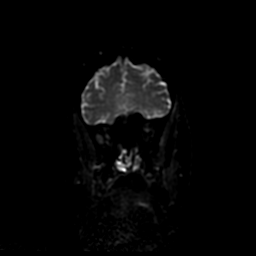
[im 84/84]
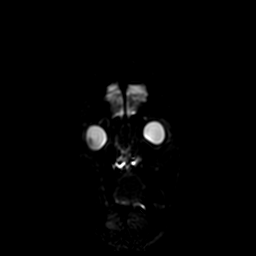

[Series 6: T2 · axial · 5.0mm · 0.43mm/px · z∈[-48,+119]mm · 2 of 25 slices shown]
[im 1/25]
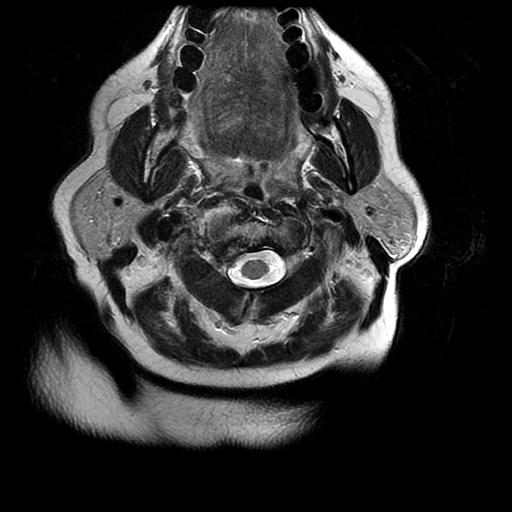
[im 25/25]
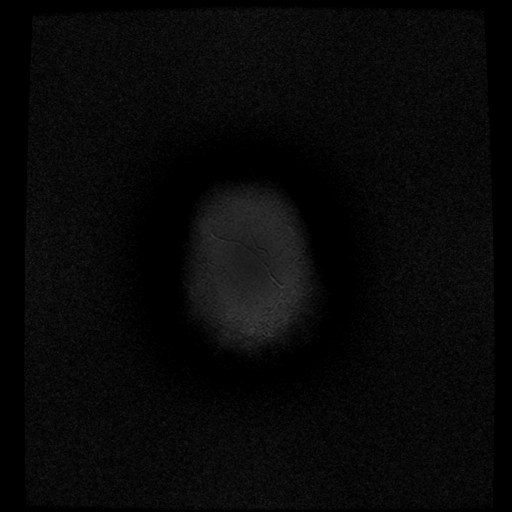

[Series 7: FLAIR · axial · 3.0mm · 0.43mm/px · z∈[-45,+116]mm · 2 of 28 slices shown]
[im 1/28]
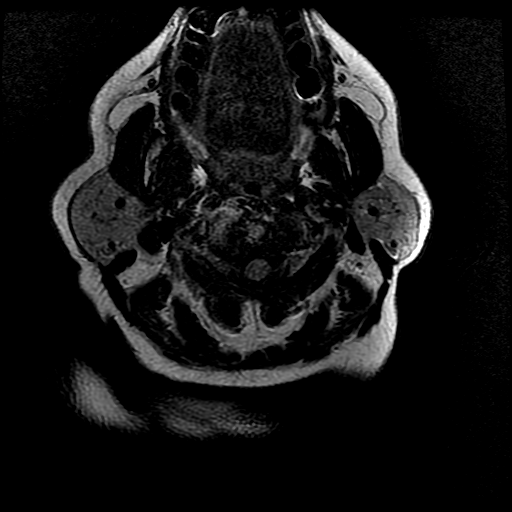
[im 28/28]
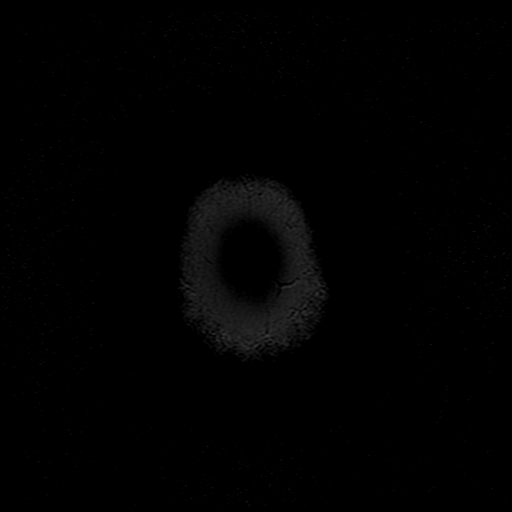

[Series 10: T2 post-contrast · coronal · 5.0mm · 0.45mm/px · 2 of 28 slices shown]
[im 1/28]
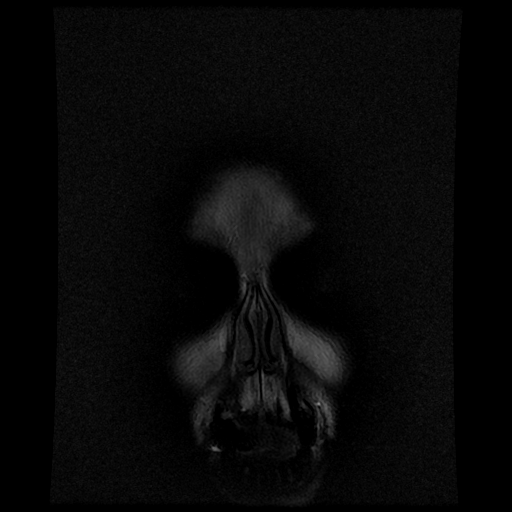
[im 28/28]
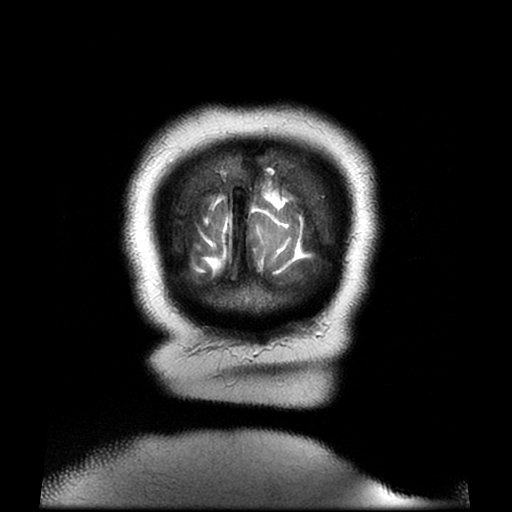

[Series 12: T1 post-contrast · coronal · 5.0mm · 0.45mm/px · 2 of 28 slices shown (1 of 2)]
[im 1/28]
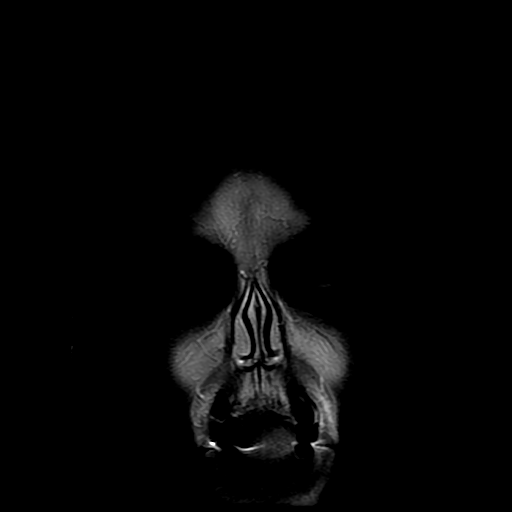
[im 28/28]
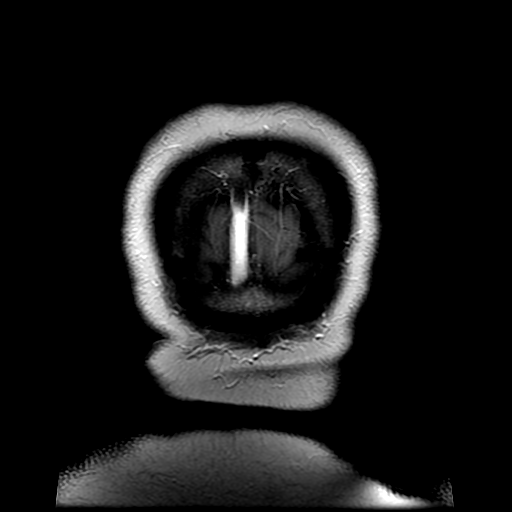

[Series 13: T1 post-contrast · sagittal · 5.0mm · 0.47mm/px · 2 of 24 slices shown (2 of 2)]
[im 1/24]
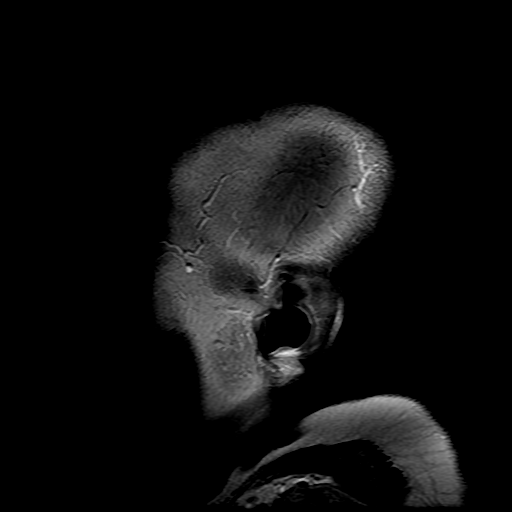
[im 24/24]
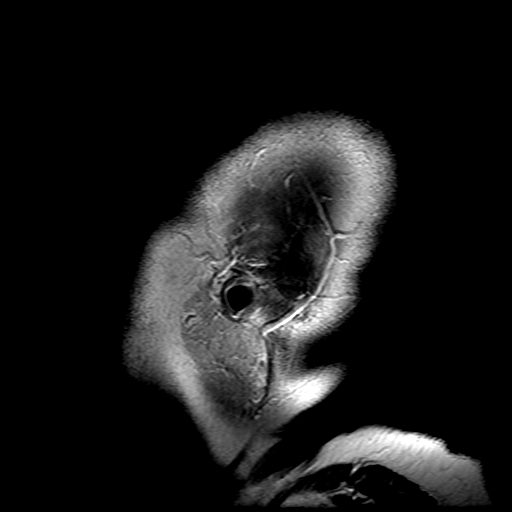

[Series 300: DWI · axial · 3.0mm · 1.09mm/px · z∈[-44,+116]mm · 4 of 55 slices shown (3 of 4)]
[im 1/55]
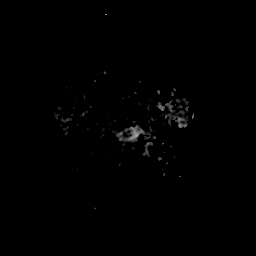
[im 19/55]
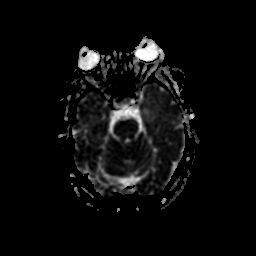
[im 37/55]
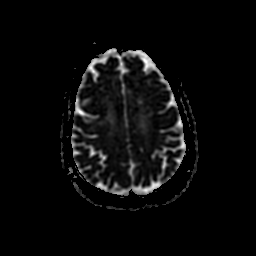
[im 55/55]
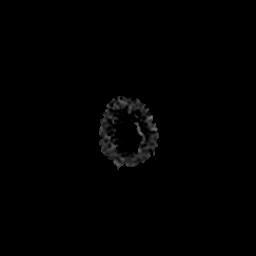

[Series 500: DWI · coronal · 4.0mm · 1.09mm/px · 3 of 42 slices shown (4 of 4)]
[im 1/42]
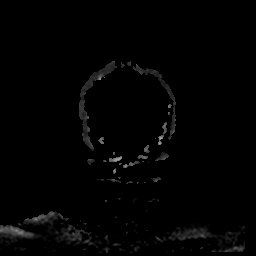
[im 21/42]
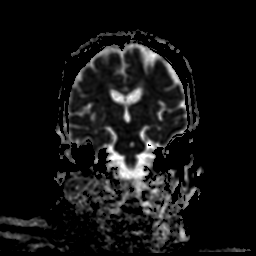
[im 42/42]
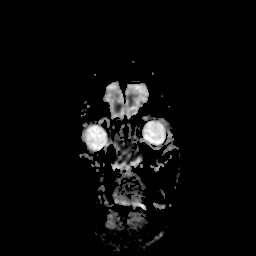

[34 of 48 positions shown; findings below may reference images not displayed]

FINDINGS: Brain: Diffusion imaging does not show any acute or subacute
infarction. The abnormality of the left pons has undergone
evolutionary changes consistent with a late subacute infarction. No
sign that this represents a mass lesion at this point. There chronic
small-vessel ischemic changes elsewhere affecting the pons and the
cerebellar peduncles. Cerebral hemispheres show moderate chronic
small-vessel ischemic changes of the deep and subcortical white
matter as well as old lacunar infarctions of the thalami and basal
ganglia. No large vessel territory infarction. Punctate focus of
hemosiderin deposition in the right thalamus related to the old
infarction. No sign of neoplastic mass lesion, recent hemorrhage,
hydrocephalus or extra-axial collection. After contrast
administration, no abnormal enhancement occurs.

Vascular: Major vessels at the base of the brain show flow.

Skull and upper cervical spine: Negative

Sinuses/Orbits: Clear/normal

Other: None
IMPRESSION: The abnormality previously seen in the left side of the pons has
undergone evolutionary changes consistent with this being a late
subacute infarction and inconsistent with metastatic disease. No
sign of any metastatic lesion a where in the region studied.

Chronic small-vessel ischemic changes elsewhere throughout the brain
as outlined above.

## 2020-04-12 ENCOUNTER — Other Ambulatory Visit: Payer: Self-pay

## 2020-04-12 ENCOUNTER — Encounter (INDEPENDENT_AMBULATORY_CARE_PROVIDER_SITE_OTHER): Payer: Medicare Other | Admitting: Ophthalmology

## 2020-04-12 DIAGNOSIS — H43813 Vitreous degeneration, bilateral: Secondary | ICD-10-CM | POA: Diagnosis not present

## 2020-04-12 DIAGNOSIS — H35033 Hypertensive retinopathy, bilateral: Secondary | ICD-10-CM | POA: Diagnosis not present

## 2020-04-12 DIAGNOSIS — H348312 Tributary (branch) retinal vein occlusion, right eye, stable: Secondary | ICD-10-CM | POA: Diagnosis not present

## 2020-04-12 DIAGNOSIS — I10 Essential (primary) hypertension: Secondary | ICD-10-CM

## 2020-06-28 ENCOUNTER — Encounter (INDEPENDENT_AMBULATORY_CARE_PROVIDER_SITE_OTHER): Payer: Medicare Other | Admitting: Ophthalmology

## 2020-06-28 ENCOUNTER — Other Ambulatory Visit: Payer: Self-pay

## 2020-06-28 DIAGNOSIS — I10 Essential (primary) hypertension: Secondary | ICD-10-CM | POA: Diagnosis not present

## 2020-06-28 DIAGNOSIS — E113293 Type 2 diabetes mellitus with mild nonproliferative diabetic retinopathy without macular edema, bilateral: Secondary | ICD-10-CM

## 2020-06-28 DIAGNOSIS — E11319 Type 2 diabetes mellitus with unspecified diabetic retinopathy without macular edema: Secondary | ICD-10-CM | POA: Diagnosis not present

## 2020-06-28 DIAGNOSIS — H43813 Vitreous degeneration, bilateral: Secondary | ICD-10-CM

## 2020-06-28 DIAGNOSIS — H348312 Tributary (branch) retinal vein occlusion, right eye, stable: Secondary | ICD-10-CM | POA: Diagnosis not present

## 2020-06-28 DIAGNOSIS — H35033 Hypertensive retinopathy, bilateral: Secondary | ICD-10-CM

## 2020-08-05 ENCOUNTER — Other Ambulatory Visit: Payer: Self-pay | Admitting: Adult Health

## 2020-08-05 DIAGNOSIS — Z853 Personal history of malignant neoplasm of breast: Secondary | ICD-10-CM

## 2020-08-06 ENCOUNTER — Ambulatory Visit: Payer: Medicare Other | Attending: Internal Medicine

## 2020-08-06 DIAGNOSIS — Z23 Encounter for immunization: Secondary | ICD-10-CM

## 2020-08-06 NOTE — Progress Notes (Signed)
   Covid-19 Vaccination Clinic  Name:  Carolyn Weber    MRN: [id number] DOB: 02/20/1941  08/06/2020  Carolyn Weber was observed post Covid-19 immunization for 15 minutes without incident. She was provided with Vaccine Information Sheet and instruction to access the V-Safe system.   Carolyn Weber was instructed to call 911 with any severe reactions post vaccine: Marland Kitchen Difficulty breathing  . Swelling of face and throat  . A fast heartbeat  . A bad rash all over body  . Dizziness and weakness

## 2020-09-18 ENCOUNTER — Other Ambulatory Visit: Payer: Self-pay

## 2020-09-18 ENCOUNTER — Ambulatory Visit
Admission: RE | Admit: 2020-09-18 | Discharge: 2020-09-18 | Disposition: A | Discharge status: Home / Self Care | Payer: Medicare Other | Source: Ambulatory Visit | Attending: Adult Health | Admitting: Adult Health

## 2020-09-18 DIAGNOSIS — Z853 Personal history of malignant neoplasm of breast: Secondary | ICD-10-CM

## 2020-10-28 ENCOUNTER — Telehealth: Payer: Self-pay | Admitting: Hematology and Oncology

## 2020-10-28 NOTE — Telephone Encounter (Signed)
Rescheduled appointment due to overbooking. Patient is aware of changes. 

## 2020-11-05 ENCOUNTER — Inpatient Hospital Stay: Payer: Medicaid Other | Admitting: Hematology and Oncology

## 2020-11-14 NOTE — Progress Notes (Signed)
Patient Care Team: Seward Carol, MD as PCP - General (Internal Medicine) Fanny Skates, MD as Consulting Physician (General Surgery) Nicholas Lose, MD as Consulting Physician (Hematology and Oncology)  DIAGNOSIS:    ICD-10-CM   1. Malignant neoplasm of upper-inner quadrant of right breast in female, estrogen receptor positive ([place])  C50.211    Z17.0     SUMMARY OF ONCOLOGIC HISTORY: Oncology History  Malignant neoplasm of upper-inner quadrant of right breast in female, estrogen receptor positive ([place])  09/21/2018 Initial Diagnosis   Screening mammogram detected right breast mass 2:30 position.  3 cm from nipple 0.9 x 0.6 x 0.8 cm, ultrasound axilla negative biopsy revealed grade 1-2 IDC ER 95%, PR 90%, HER-2 1+ negative, Ki-67 5%, T1b N0 stage Ia clinical stage   10/10/2018 Cancer Staging   Staging form: Breast, AJCC 8th Edition - Clinical: Stage IA (cT1b, cN0, cM0, G2, ER+, PR+, HER2-) - Signed by Nicholas Lose, MD on 10/10/2018   10/28/2018 Genetic Testing   Genetic testing showed no pathogenic mutations. 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  Genes tested include: AIP, ALK, APC, ATM, AXIN2,BAP1,  BARD1, BLM, BMPR1A, BRCA1, BRCA2, BRIP1, CASR, CDC73, CDH1, CDK4, CDKN1B, CDKN1C, CDKN2A (p14ARF), CDKN2A (p16INK4a), CEBPA, CHEK2, CTNNA1, DICER1, DIS3L2, EGFR (c.2369C>T, p.Thr790Met variant only), EPCAM (Deletion/duplication testing only), FH, FLCN, GATA2, GPC3, GREM1 (Promoter region deletion/duplication testing only), HOXB13 (c.251G>A, p.Gly84Glu), HRAS, KIT, MAX, MEN1, MET, MITF (c.952G>A, p.Glu318Lys variant only), MLH1, MSH2, MSH3, MSH6, MUTYH, NBN, NF1, NF2, NTHL1, PALB2, PDGFRA, PHOX2B, PMS2, POLD1, POLE, POT1, PRKAR1A, PTCH1, PTEN, RAD50, RAD51C, RAD51D, RB1, RECQL4, RET, RUNX1, SDHAF2, SDHA (sequence changes only), SDHB, SDHC, SDHD, SMAD4, SMARCA4, SMARCB1, SMARCE1, STK11, SUFU, TERC, TERT, TMEM127, TP53, TSC1, TSC2, VHL, WRN and WT1.      01/06/2019 Surgery   Right lumpectomy: IDC, grade 1, 0.8 cm, margins negative, negative for LV I or PNI, ER 95%, PR 90%, HER-2 negative, Ki-67 5% T1BN0 stage Ia   01/06/2019 Cancer Staging   Staging form: Breast, AJCC 8th Edition - Pathologic stage from 01/06/2019: Stage IA (pT1b, pN0, cM0, G1, ER+, PR+, HER2-) - Signed by Gardenia Phlegm, NP on 01/25/2019   02/2019 -  Anti-estrogen oral therapy   Anastrozole daily     CHIEF COMPLIANT: Follow-up of right breast cancer on anastrozole  INTERVAL HISTORY: Carolyn Weber is a 80 y.o. with above-mentioned history of right breast cancer who underwent a lumpectomy and is currently on antiestrogen therapy with anastrozole. Mammogram on 09/18/20 showed no evidence of malignancy bilaterally. She presents to the clinic today for follow-up.   She reports no problems taking anastrozole.  She has very occasional hot flashes.  Denies any pain lumps or nodules in the breast.  ALLERGIES:  has No Known Allergies.  MEDICATIONS:  Current Outpatient Medications  Medication Sig Dispense Refill  . acetaminophen (TYLENOL) 650 MG CR tablet Take 650 mg by mouth every 8 (eight) hours as needed for pain.    Marland Kitchen amLODipine (NORVASC) 10 MG tablet Take 10 mg by mouth daily.     Marland Kitchen anastrozole (ARIMIDEX) 1 MG tablet Take 1 tablet (1 mg total) by mouth daily. 90 tablet 3  . atorvastatin (LIPITOR) 40 MG tablet Take 40 mg by mouth at bedtime.     Marland Kitchen Besifloxacin HCl (BESIVANCE) 0.6 % SUSP Place 1 drop into the right eye See admin instructions. Drop 1 drop in the right eye the day of and the day after eye injection.    Marland Kitchen  calcium carbonate (OS-CAL) 600 MG TABS Take 600 mg by mouth 2 (two) times daily with a meal.    . Cetirizine HCl (ZYRTEC PO) Take 10 mg by mouth daily.     . cholecalciferol (VITAMIN D) 1000 UNITS tablet Take 1,000 Units by mouth 2 (two) times daily.     . clobetasol cream (TEMOVATE) 4.78 % Apply 1 application topically daily as needed (irritation).     .  clopidogrel (PLAVIX) 75 MG tablet Take 75 mg by mouth daily.    Marland Kitchen imipramine (TOFRANIL) 50 MG tablet Take 1 tablet (50 mg total) by mouth at bedtime.    . Multiple Vitamin (MULTIVITAMIN) tablet Take 0.5 tablets by mouth 2 (two) times daily.     Marland Kitchen OVER THE COUNTER MEDICATION Take 1 tablet by mouth 2 (two) times daily. Viviscal Hair Growth Supplement    . oxybutynin (OXYTROL) 3.9 MG/24HR Place 1 patch onto the skin every 3 (three) days. Pt taking every 5 days    . PAZEO 0.7 % SOLN Place 1 drop into both eyes every morning.     Vladimir Faster Glycol-Propyl Glycol (SYSTANE OP) Apply 1 drop to eye every morning.      No current facility-administered medications for this visit.    PHYSICAL EXAMINATION: ECOG PERFORMANCE STATUS: 1 - Symptomatic but completely ambulatory  Vitals:   11/15/20 1032  BP: (!) 149/66  Pulse: 93  Resp: 16  Temp: 98.1 F (36.7 C)  SpO2: 100%   Filed Weights   11/15/20 1032  Weight: 198 lb 1.6 oz (89.9 kg)    BREAST: No palpable masses or nodules in either right or left breasts. No palpable axillary supraclavicular or infraclavicular adenopathy no breast tenderness or nipple discharge. (exam performed in the presence of a chaperone)  LABORATORY DATA:  I have reviewed the data as listed CMP Latest Ref Rng & Units 12/30/2018 12/13/2018 07/09/2014  Glucose 70 - 99 mg/dL 119(H) - 209(H)  BUN 8 - 23 mg/dL 19 - 43(H)  Creatinine 0.44 - 1.00 mg/dL 1.12(H) 1.20(H) 2.45(H)  Sodium 135 - 145 mmol/L 136 - 135  Potassium 3.5 - 5.1 mmol/L 4.2 - 3.7  Chloride 98 - 111 mmol/L 103 - 96  CO2 22 - 32 mmol/L 25 - 28  Calcium 8.9 - 10.3 mg/dL 10.0 - 9.7  Total Protein 6.5 - 8.1 g/dL 7.5 - 7.0  Total Bilirubin 0.3 - 1.2 mg/dL 0.5 - 0.7  Alkaline Phos 38 - 126 U/L 51 - 63  AST 15 - 41 U/L 23 - 23  ALT 0 - 44 U/L 16 - 12    Lab Results  Component Value Date   WBC 5.7 12/30/2018   HGB 12.9 12/30/2018   HCT 41.3 12/30/2018   MCV 86.2 12/30/2018   PLT 218 12/30/2018   NEUTROABS  3.8 12/30/2018    ASSESSMENT & PLAN:  Malignant neoplasm of upper-inner quadrant of right breast in female, estrogen receptor positive ([place]) 01/06/2019:Right lumpectomy: IDC, grade 1, 0.8 cm, margins negative, negative for LV I or PNI, ER 95%, PR 90%, HER-2 negative, Ki-67 5% T1BN0 stage Ia Given the favorable prognostic features and her age, radiation was omitted Current treatment: Anastrozole started 02/20/2019  Anastrozole toxicities: Denies any adverse effects to anastrozole.  Breast cancer surveillance: 1.  Mammogram 09/15/2019: Benign, breast density category C 2.  Breast exam 11/06/2019: Benign 3.  Bone density 10/11/2019: T score -0.5: Normal  RTC in 1 year    No orders of the defined types were placed  in this encounter.  The patient has a good understanding of the overall plan. she agrees with it. she will call with any problems that may develop before the next visit here.  Total time spent: 20 mins including face to face time and time spent for planning, charting and coordination of care  Nicholas Lose, MD 11/15/2020  I, Cloyde Reams Dorshimer, am acting as scribe for Dr. Nicholas Lose.  I have reviewed the above documentation for accuracy and completeness, and I agree with the above.

## 2020-11-15 ENCOUNTER — Other Ambulatory Visit: Payer: Self-pay

## 2020-11-15 ENCOUNTER — Inpatient Hospital Stay: Payer: 59 | Attending: Hematology and Oncology | Admitting: Hematology and Oncology

## 2020-11-15 DIAGNOSIS — Z170 Estrogen receptor positive status [ER+]: Secondary | ICD-10-CM | POA: Diagnosis not present

## 2020-11-15 DIAGNOSIS — C50211 Malignant neoplasm of upper-inner quadrant of right female breast: Secondary | ICD-10-CM | POA: Insufficient documentation

## 2020-11-15 DIAGNOSIS — Z79811 Long term (current) use of aromatase inhibitors: Secondary | ICD-10-CM | POA: Insufficient documentation

## 2020-11-15 MED ORDER — ANASTROZOLE 1 MG PO TABS: 1.0000 mg | ORAL_TABLET | Freq: Every day | ORAL | 3 refills | Status: DC

## 2020-11-15 NOTE — Assessment & Plan Note (Signed)
01/06/2019:Right lumpectomy: IDC, grade 1, 0.8 cm, margins negative, negative for LV I or PNI, ER 95%, PR 90%, HER-2 negative, Ki-67 5% T1BN0 stage Ia Given the favorable prognostic features and her age, radiation was omitted Current treatment: Anastrozole started 02/20/2019  Anastrozole toxicities:  Breast cancer surveillance: 1.  Mammogram 09/15/2019: Benign, breast density category C 2.  Breast exam 11/06/2019: Benign 3.  Bone density 10/11/2019: T score -0.5: Normal  RTC in 1 year

## 2020-11-22 ENCOUNTER — Encounter (INDEPENDENT_AMBULATORY_CARE_PROVIDER_SITE_OTHER): Payer: Medicaid Other | Admitting: Ophthalmology

## 2020-12-20 ENCOUNTER — Other Ambulatory Visit: Payer: Self-pay

## 2020-12-20 ENCOUNTER — Encounter (INDEPENDENT_AMBULATORY_CARE_PROVIDER_SITE_OTHER): Payer: 59 | Admitting: Ophthalmology

## 2020-12-20 DIAGNOSIS — I10 Essential (primary) hypertension: Secondary | ICD-10-CM

## 2020-12-20 DIAGNOSIS — H35033 Hypertensive retinopathy, bilateral: Secondary | ICD-10-CM

## 2020-12-20 DIAGNOSIS — H348312 Tributary (branch) retinal vein occlusion, right eye, stable: Secondary | ICD-10-CM | POA: Diagnosis not present

## 2020-12-20 DIAGNOSIS — H43813 Vitreous degeneration, bilateral: Secondary | ICD-10-CM

## 2020-12-20 DIAGNOSIS — E113293 Type 2 diabetes mellitus with mild nonproliferative diabetic retinopathy without macular edema, bilateral: Secondary | ICD-10-CM | POA: Diagnosis not present

## 2021-06-20 ENCOUNTER — Other Ambulatory Visit: Payer: Self-pay

## 2021-06-20 ENCOUNTER — Encounter (INDEPENDENT_AMBULATORY_CARE_PROVIDER_SITE_OTHER): Payer: 59 | Admitting: Ophthalmology

## 2021-06-20 DIAGNOSIS — H35033 Hypertensive retinopathy, bilateral: Secondary | ICD-10-CM

## 2021-06-20 DIAGNOSIS — I10 Essential (primary) hypertension: Secondary | ICD-10-CM

## 2021-06-20 DIAGNOSIS — H348312 Tributary (branch) retinal vein occlusion, right eye, stable: Secondary | ICD-10-CM

## 2021-06-20 DIAGNOSIS — E113292 Type 2 diabetes mellitus with mild nonproliferative diabetic retinopathy without macular edema, left eye: Secondary | ICD-10-CM | POA: Diagnosis not present

## 2021-06-20 DIAGNOSIS — H43813 Vitreous degeneration, bilateral: Secondary | ICD-10-CM

## 2021-08-11 ENCOUNTER — Other Ambulatory Visit: Payer: Self-pay | Admitting: Internal Medicine

## 2021-08-11 DIAGNOSIS — Z9889 Other specified postprocedural states: Secondary | ICD-10-CM

## 2021-09-16 ENCOUNTER — Other Ambulatory Visit: Payer: Self-pay | Admitting: Adult Health

## 2021-09-16 DIAGNOSIS — Z9889 Other specified postprocedural states: Secondary | ICD-10-CM

## 2021-09-19 ENCOUNTER — Other Ambulatory Visit: Payer: Self-pay

## 2021-09-19 ENCOUNTER — Ambulatory Visit
Admission: RE | Admit: 2021-09-19 | Discharge: 2021-09-19 | Disposition: A | Discharge status: Home / Self Care | Payer: 59 | Source: Ambulatory Visit | Attending: Internal Medicine | Admitting: Internal Medicine

## 2021-09-19 DIAGNOSIS — Z9889 Other specified postprocedural states: Secondary | ICD-10-CM

## 2021-11-14 NOTE — Progress Notes (Signed)
Patient Care Team: Seward Carol, MD as PCP - General (Internal Medicine) Fanny Skates, MD as Consulting Physician (General Surgery) Nicholas Lose, MD as Consulting Physician (Hematology and Oncology)  DIAGNOSIS:    ICD-10-CM   1. Malignant neoplasm of upper-inner quadrant of right breast in female, estrogen receptor positive ([place])  C50.211    Z17.0       SUMMARY OF ONCOLOGIC HISTORY: Oncology History  Malignant neoplasm of upper-inner quadrant of right breast in female, estrogen receptor positive ([place])  09/21/2018 Initial Diagnosis   Screening mammogram detected right breast mass 2:30 position.  3 cm from nipple 0.9 x 0.6 x 0.8 cm, ultrasound axilla negative biopsy revealed grade 1-2 IDC ER 95%, PR 90%, HER-2 1+ negative, Ki-67 5%, T1b N0 stage Ia clinical stage   10/10/2018 Cancer Staging   Staging form: Breast, AJCC 8th Edition - Clinical: Stage IA (cT1b, cN0, cM0, G2, ER+, PR+, HER2-) - Signed by Nicholas Lose, MD on 10/10/2018    10/28/2018 Genetic Testing   Genetic testing showed no pathogenic mutations. 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  Genes tested include: AIP, ALK, APC, ATM, AXIN2,BAP1,  BARD1, BLM, BMPR1A, BRCA1, BRCA2, BRIP1, CASR, CDC73, CDH1, CDK4, CDKN1B, CDKN1C, CDKN2A (p14ARF), CDKN2A (p16INK4a), CEBPA, CHEK2, CTNNA1, DICER1, DIS3L2, EGFR (c.2369C>T, p.Thr790Met variant only), EPCAM (Deletion/duplication testing only), FH, FLCN, GATA2, GPC3, GREM1 (Promoter region deletion/duplication testing only), HOXB13 (c.251G>A, p.Gly84Glu), HRAS, KIT, MAX, MEN1, MET, MITF (c.952G>A, p.Glu318Lys variant only), MLH1, MSH2, MSH3, MSH6, MUTYH, NBN, NF1, NF2, NTHL1, PALB2, PDGFRA, PHOX2B, PMS2, POLD1, POLE, POT1, PRKAR1A, PTCH1, PTEN, RAD50, RAD51C, RAD51D, RB1, RECQL4, RET, RUNX1, SDHAF2, SDHA (sequence changes only), SDHB, SDHC, SDHD, SMAD4, SMARCA4, SMARCB1, SMARCE1, STK11, SUFU, TERC, TERT, TMEM127, TP53, TSC1, TSC2, VHL, WRN and WT1.       01/06/2019 Surgery   Right lumpectomy: IDC, grade 1, 0.8 cm, margins negative, negative for LV I or PNI, ER 95%, PR 90%, HER-2 negative, Ki-67 5% T1BN0 stage Ia   01/06/2019 Cancer Staging   Staging form: Breast, AJCC 8th Edition - Pathologic stage from 01/06/2019: Stage IA (pT1b, pN0, cM0, G1, ER+, PR+, HER2-) - Signed by Gardenia Phlegm, NP on 01/25/2019    02/2019 -  Anti-estrogen oral therapy   Anastrozole daily     CHIEF COMPLIANT: Follow-up of right breast cancer on anastrozole  INTERVAL HISTORY: Carolyn Weber is a 81 y.o. with above-mentioned history of right breast cancer who underwent a lumpectomy and is currently on antiestrogen therapy with anastrozole. Mammogram on 09/19/2021 showed no evidence of malignancy bilaterally. She presents to the clinic today for follow-up.  She is tolerating anastrozole extremely well without any problems or concerns.  Denies any hot flashes or arthralgias or myalgias.  Denies any lumps or nodules in the breast.   ALLERGIES:  has No Known Allergies.  MEDICATIONS:  Current Outpatient Medications  Medication Sig Dispense Refill   acetaminophen (TYLENOL) 650 MG CR tablet Take 650 mg by mouth every 8 (eight) hours as needed for pain.     amLODipine (NORVASC) 10 MG tablet Take 10 mg by mouth daily.      anastrozole (ARIMIDEX) 1 MG tablet Take 1 tablet (1 mg total) by mouth daily. 90 tablet 3   atorvastatin (LIPITOR) 40 MG tablet Take 40 mg by mouth at bedtime.      Besifloxacin HCl (BESIVANCE) 0.6 % SUSP Place 1 drop into the right eye See admin instructions. Drop 1 drop in the right eye  the day of and the day after eye injection.     calcium carbonate (OS-CAL) 600 MG TABS Take 600 mg by mouth 2 (two) times daily with a meal.     Cetirizine HCl (ZYRTEC PO) Take 10 mg by mouth daily.      cholecalciferol (VITAMIN D) 1000 UNITS tablet Take 1,000 Units by mouth 2 (two) times daily.      clobetasol cream (TEMOVATE) 2.77 % Apply 1 application  topically daily as needed (irritation).      clopidogrel (PLAVIX) 75 MG tablet Take 75 mg by mouth daily.     imipramine (TOFRANIL) 50 MG tablet Take 1 tablet (50 mg total) by mouth at bedtime.     Multiple Vitamin (MULTIVITAMIN) tablet Take 0.5 tablets by mouth 2 (two) times daily.      OVER THE COUNTER MEDICATION Take 1 tablet by mouth 2 (two) times daily. Viviscal Hair Growth Supplement     oxybutynin (OXYTROL) 3.9 MG/24HR Place 1 patch onto the skin every 3 (three) days. Pt taking every 5 days     Polyethyl Glycol-Propyl Glycol (SYSTANE OP) Apply 1 drop to eye every morning.      No current facility-administered medications for this visit.    PHYSICAL EXAMINATION: ECOG PERFORMANCE STATUS: 1 - Symptomatic but completely ambulatory  Vitals:   11/17/21 1025  BP: (!) 155/69  Pulse: 86  Resp: 18  Temp: 97.8 F (36.6 C)  SpO2: 100%   Filed Weights   11/17/21 1025  Weight: 192 lb 3.2 oz (87.2 kg)    BREAST: No palpable masses or nodules in either right or left breasts. No palpable axillary supraclavicular or infraclavicular adenopathy no breast tenderness or nipple discharge. (exam performed in the presence of a chaperone)  LABORATORY DATA:  I have reviewed the data as listed CMP Latest Ref Rng & Units 12/30/2018 12/13/2018 07/09/2014  Glucose 70 - 99 mg/dL 119(H) - 209(H)  BUN 8 - 23 mg/dL 19 - 43(H)  Creatinine 0.44 - 1.00 mg/dL 1.12(H) 1.20(H) 2.45(H)  Sodium 135 - 145 mmol/L 136 - 135  Potassium 3.5 - 5.1 mmol/L 4.2 - 3.7  Chloride 98 - 111 mmol/L 103 - 96  CO2 22 - 32 mmol/L 25 - 28  Calcium 8.9 - 10.3 mg/dL 10.0 - 9.7  Total Protein 6.5 - 8.1 g/dL 7.5 - 7.0  Total Bilirubin 0.3 - 1.2 mg/dL 0.5 - 0.7  Alkaline Phos 38 - 126 U/L 51 - 63  AST 15 - 41 U/L 23 - 23  ALT 0 - 44 U/L 16 - 12    Lab Results  Component Value Date   WBC 5.7 12/30/2018   HGB 12.9 12/30/2018   HCT 41.3 12/30/2018   MCV 86.2 12/30/2018   PLT 218 12/30/2018   NEUTROABS 3.8 12/30/2018     ASSESSMENT & PLAN:  Malignant neoplasm of upper-inner quadrant of right breast in female, estrogen receptor positive ([place]) 01/06/2019:Right lumpectomy: IDC, grade 1, 0.8 cm, margins negative, negative for LV I or PNI, ER 95%, PR 90%, HER-2 negative, Ki-67 5% T1BN0 stage Ia Given the favorable prognostic features and her age, radiation was omitted Current treatment: Anastrozole started 02/20/2019   Anastrozole toxicities: Denies any adverse effects to anastrozole.   Breast cancer surveillance: 1.  Mammogram 09/19/2021: Benign, breast density category C 2.  Breast exam 11/16/2021: Benign 3.  Bone density 10/11/2019: T score -0.5: Normal   RTC in 1 year    No orders of the defined types were placed in  this encounter.  The patient has a good understanding of the overall plan. she agrees with it. she will call with any problems that may develop before the next visit here.  Total time spent: 20 mins including face to face time and time spent for planning, charting and coordination of care  Rulon Eisenmenger, MD, MPH 11/17/2021  I, Thana Ates, am acting as scribe for Dr. Nicholas Lose.  I have reviewed the above documentation for accuracy and completeness, and I agree with the above.

## 2021-11-16 NOTE — Assessment & Plan Note (Signed)
01/06/2019:Right lumpectomy: IDC, grade 1, 0.8 cm, margins negative, negative for LV I or PNI, ER 95%, PR 90%, HER-2 negative, Ki-67 5% T1BN0 stage Ia Given the favorable prognostic features and her age, radiation was omitted Current treatment: Anastrozole started 02/20/2019  Anastrozole toxicities: Denies any adverse effects to anastrozole.  Breast cancer surveillance: 1.Mammogram 09/19/2021: Benign, breast density category C 2.Breast exam 11/16/2021: Benign 3.Bone density 10/11/2019: T score -0.5: Normal  RTC in 1 year

## 2021-11-17 ENCOUNTER — Other Ambulatory Visit: Payer: Self-pay

## 2021-11-17 ENCOUNTER — Inpatient Hospital Stay: Payer: 59 | Attending: Hematology and Oncology | Admitting: Hematology and Oncology

## 2021-11-17 DIAGNOSIS — C50211 Malignant neoplasm of upper-inner quadrant of right female breast: Secondary | ICD-10-CM | POA: Diagnosis not present

## 2021-11-17 DIAGNOSIS — Z170 Estrogen receptor positive status [ER+]: Secondary | ICD-10-CM | POA: Insufficient documentation

## 2021-11-17 DIAGNOSIS — Z79811 Long term (current) use of aromatase inhibitors: Secondary | ICD-10-CM | POA: Insufficient documentation

## 2021-11-17 MED ORDER — ANASTROZOLE 1 MG PO TABS: 1.0000 mg | ORAL_TABLET | Freq: Every day | ORAL | 3 refills | Status: DC

## 2021-11-17 MED ORDER — METFORMIN HCL 500 MG PO TABS: 500.0000 mg | ORAL_TABLET | Freq: Two times a day (BID) | ORAL | Status: DC

## 2021-11-17 MED ORDER — GEMTESA 75 MG PO TABS: 1.0000 | ORAL_TABLET | Freq: Every day | ORAL | Status: AC

## 2021-12-26 ENCOUNTER — Encounter (INDEPENDENT_AMBULATORY_CARE_PROVIDER_SITE_OTHER): Payer: 59 | Admitting: Ophthalmology

## 2021-12-26 ENCOUNTER — Other Ambulatory Visit: Payer: Self-pay

## 2021-12-26 DIAGNOSIS — H348312 Tributary (branch) retinal vein occlusion, right eye, stable: Secondary | ICD-10-CM

## 2021-12-26 DIAGNOSIS — H35033 Hypertensive retinopathy, bilateral: Secondary | ICD-10-CM

## 2021-12-26 DIAGNOSIS — E113291 Type 2 diabetes mellitus with mild nonproliferative diabetic retinopathy without macular edema, right eye: Secondary | ICD-10-CM

## 2021-12-26 DIAGNOSIS — E113392 Type 2 diabetes mellitus with moderate nonproliferative diabetic retinopathy without macular edema, left eye: Secondary | ICD-10-CM

## 2021-12-26 DIAGNOSIS — H43813 Vitreous degeneration, bilateral: Secondary | ICD-10-CM

## 2021-12-26 DIAGNOSIS — I10 Essential (primary) hypertension: Secondary | ICD-10-CM | POA: Diagnosis not present

## 2021-12-26 DIAGNOSIS — H2513 Age-related nuclear cataract, bilateral: Secondary | ICD-10-CM

## 2022-03-16 ENCOUNTER — Other Ambulatory Visit: Payer: Self-pay | Admitting: Internal Medicine

## 2022-03-16 DIAGNOSIS — E2839 Other primary ovarian failure: Secondary | ICD-10-CM

## 2022-06-01 ENCOUNTER — Encounter: Payer: Self-pay | Admitting: Podiatry

## 2022-06-01 ENCOUNTER — Ambulatory Visit (INDEPENDENT_AMBULATORY_CARE_PROVIDER_SITE_OTHER): Payer: 59 | Admitting: Podiatry

## 2022-06-01 DIAGNOSIS — B351 Tinea unguium: Secondary | ICD-10-CM | POA: Diagnosis not present

## 2022-06-01 DIAGNOSIS — M2011 Hallux valgus (acquired), right foot: Secondary | ICD-10-CM

## 2022-06-01 DIAGNOSIS — E1142 Type 2 diabetes mellitus with diabetic polyneuropathy: Secondary | ICD-10-CM | POA: Diagnosis not present

## 2022-06-01 DIAGNOSIS — M79674 Pain in right toe(s): Secondary | ICD-10-CM | POA: Diagnosis not present

## 2022-06-01 DIAGNOSIS — M79675 Pain in left toe(s): Secondary | ICD-10-CM

## 2022-06-01 DIAGNOSIS — E119 Type 2 diabetes mellitus without complications: Secondary | ICD-10-CM | POA: Diagnosis not present

## 2022-06-01 DIAGNOSIS — M2041 Other hammer toe(s) (acquired), right foot: Secondary | ICD-10-CM

## 2022-06-01 DIAGNOSIS — M2042 Other hammer toe(s) (acquired), left foot: Secondary | ICD-10-CM

## 2022-06-01 DIAGNOSIS — M2012 Hallux valgus (acquired), left foot: Secondary | ICD-10-CM

## 2022-06-05 NOTE — Progress Notes (Signed)
ANNUAL DIABETIC FOOT EXAM  Subjective: Carolyn Weber presents today for annual diabetic foot examination.  Patient confirms h/o diabetes.  Patient relates 1 year h/o diabetes.  Patient denies any h/o foot wounds.  Last known  HgA1c was 6.3%.  Patient does not monitor blood glucose daily.  Risk factors: diabetes, h/o TIA, HTN, CKD, hypercholesterolemia, h/o tobacco use in remission.  Seward Carol, MD is patient's PCP. Last visit was April 23, 2022.  Past Medical History:  Diagnosis Date   Allergy    Arthritis    Cancer ([place]) 10/2018   breast right, receptor positive   Chronic kidney disease    Stage 2-3   Colon polyps    Complication of anesthesia    Hard to wake up   Diabetic retinopathy ([place])    Type II   Difficulty in urination    Diverticulitis    Family history of breast cancer    Family history of lung cancer    Family history of prostate cancer    Hyperlipemia    Hypertension    Obesity    Pre-diabetes    TIA (transient ischemic attack)    Patient Active Problem List   Diagnosis Date Noted   Genetic testing 11/01/2018   Family history of breast cancer    Family history of prostate cancer    Family history of lung cancer    Malignant neoplasm of upper-inner quadrant of right breast in female, estrogen receptor positive ([place]) 10/10/2018   HYPERCHOLESTEROLEMIA, PURE 06/21/2007   HYPERTENSION, BENIGN ESSENTIAL 06/21/2007   ALLERGIC RHINITIS, SEASONAL 06/21/2007   DIVERTICULOSIS, COLON 06/21/2007   POSTMENOPAUSAL STATUS 06/21/2007   URINARY INCONTINENCE 06/21/2007   DIVERTICULITIS, HX OF 06/21/2007   MAMMOGRAM, ABNORMAL, RIGHT, HX OF 06/21/2007   Past Surgical History:  Procedure Laterality Date   BREAST EXCISIONAL BIOPSY Right    about 30 years ago.    BREAST LUMPECTOMY WITH RADIOACTIVE SEED LOCALIZATION Right 01/06/2019   Procedure: RIGHT BREAST LUMPECTOMY WITH RADIOACTIVE SEED LOCALIZATION;  Surgeon: Fanny Skates, MD;  Location: [facility];  Service:  General;  Laterality: Right;   BREAST SURGERY     breast biospy   calcium removal from breast     COLONOSCOPY     right breast biopsy Right 09/21/2018   urinary sling     Current Outpatient Medications on File Prior to Visit  Medication Sig Dispense Refill   acetaminophen (TYLENOL) 650 MG CR tablet Take 650 mg by mouth every 8 (eight) hours as needed for pain.     amLODipine (NORVASC) 10 MG tablet Take 1 tablet by mouth daily.     anastrozole (ARIMIDEX) 1 MG tablet 1 tablet     Ascorbic Acid (VITAMIN C) 500 MG CHEW 1 tablet     atorvastatin (LIPITOR) 40 MG tablet Take 1 tablet by mouth every evening.     Besifloxacin HCl (BESIVANCE) 0.6 % SUSP Place 1 drop into the right eye See admin instructions. Drop 1 drop in the right eye the day of and the day after eye injection.     calcium carbonate (OS-CAL) 600 MG TABS Take 600 mg by mouth 2 (two) times daily with a meal.     cetirizine (ZYRTEC ALLERGY) 10 MG tablet 1 tablet     cholecalciferol (VITAMIN D) 1000 UNITS tablet Take 1,000 Units by mouth 2 (two) times daily.      clobetasol cream (TEMOVATE) 9.56 % Apply 1 application topically daily as needed (irritation).      clopidogrel (PLAVIX)  75 MG tablet Take 1 tablet by mouth daily.     metFORMIN (GLUCOPHAGE) 500 MG tablet Take 1 tablet (500 mg total) by mouth 2 (two) times daily with a meal.     Multiple Vitamin (MULTIVITAMIN) tablet Take 0.5 tablets by mouth 2 (two) times daily.      OVER THE COUNTER MEDICATION Take 1 tablet by mouth 2 (two) times daily. Viviscal Hair Growth Supplement     oxybutynin (OXYTROL) 3.9 MG/24HR Place 1 patch onto the skin every 3 (three) days. Pt taking every 5 days     Polyethyl Glycol-Propyl Glycol (SYSTANE OP) Apply 1 drop to eye every morning.      Vibegron (GEMTESA) 75 MG TABS Take 1 tablet by mouth daily. 30 tablet    No current facility-administered medications on file prior to visit.    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  Tobacco Use   Smoking status: Former    Types: Cigarettes    Quit date: 11/02/1978    Years since quitting: 43.6   Smokeless tobacco: Never  Vaping Use   Vaping Use: Never used  Substance and Sexual Activity   Alcohol use: No   Drug use: Never   Sexual activity: Not on file   Family History  Problem Relation Age of Onset   Breast cancer Mother        48s   Diabetes Mother    Heart disease Mother    Cancer Mother    Hyperlipidemia Mother    Hypertension Mother    Stroke Mother    Prostate cancer Father        35s   Diabetes Father    Heart disease Father    Hyperlipidemia Father    Hypertension Father    Breast cancer Brother 50   Prostate cancer Brother        54s   Colon polyps Brother    Breast cancer Maternal Aunt        38s   Prostate cancer Maternal Uncle    Lung cancer Paternal Grandmother    Breast cancer Maternal Aunt        70s   Breast cancer Cousin    Breast cancer Cousin    Breast cancer Cousin    Breast cancer Cousin    Breast cancer Cousin    Immunization History  Administered Date(s) Administered   Influenza Whole 09/07/2006   PFIZER(Purple Top)SARS-COV-2 Vaccination 11/21/2019, 12/12/2019, 08/06/2020   Td 09/03/2003     Review of Systems: Negative except as noted in the HPI.   Objective: There were no vitals filed for this visit.  Carolyn Weber is a pleasant 81 y.o. female in NAD. AAO X 3.  Objective:   Vascular Examination: Vascular status intact b/l with palpable pedal pulses. Pedal hair present b/l. CFT immediate b/l. No pain with calf compression b/l. Skin temperature gradient WNL b/l. Trace edema noted BLE.  Neurological Examination: Sensation grossly intact b/l with 10 gram monofilament. Vibratory sensation intact b/l. Pt has subjective symptoms of neuropathy.  Dermatological Examination: Pedal skin with  normal turgor, texture and tone b/l. Toenails 1-5 b/l thick, discolored, elongated with subungual debris and pain on dorsal palpation. No hyperkeratotic lesions noted b/l.   Musculoskeletal Examination: Muscle strength 5/5 to b/l LE.  HAV with bunion deformity R>L. Hammertoe deformity noted  2-5 b/l.  Radiographs: None  Footwear Assessment: Does the patient wear appropriate shoes? Yes. Does the patient need inserts/orthotics? No.  ADA Risk Categorization: Low Risk :  Patient has all of the following: Intact protective sensation No prior foot ulcer  No severe deformity Pedal pulses present  Assessment: 1. Pain due to onychomycosis of toenails of both feet   2. Hallux valgus, acquired, bilateral   3. Acquired hammertoes of both feet   4. Diabetic peripheral neuropathy associated with type 2 diabetes mellitus ([place])   5. Encounter for diabetic foot exam [facility])     Plan: -Patient was evaluated and treated. All patient's and/or POA's questions/concerns answered on today's visit. -Diabetic foot examination performed today. -Patient to continue soft, supportive shoe gear daily. -Toenails 1-5 b/l were debrided in length and girth with sterile nail nippers and dremel without iatrogenic bleeding.  -Patient/POA to call should there be question/concern in the interim. Return in about 3 months (around 09/01/2022).  Marzetta Board, DPM

## 2022-06-26 ENCOUNTER — Encounter (INDEPENDENT_AMBULATORY_CARE_PROVIDER_SITE_OTHER): Payer: 59 | Admitting: Ophthalmology

## 2022-06-26 DIAGNOSIS — H348312 Tributary (branch) retinal vein occlusion, right eye, stable: Secondary | ICD-10-CM

## 2022-06-26 DIAGNOSIS — I10 Essential (primary) hypertension: Secondary | ICD-10-CM | POA: Diagnosis not present

## 2022-06-26 DIAGNOSIS — E113292 Type 2 diabetes mellitus with mild nonproliferative diabetic retinopathy without macular edema, left eye: Secondary | ICD-10-CM

## 2022-06-26 DIAGNOSIS — H43813 Vitreous degeneration, bilateral: Secondary | ICD-10-CM

## 2022-06-26 DIAGNOSIS — E113391 Type 2 diabetes mellitus with moderate nonproliferative diabetic retinopathy without macular edema, right eye: Secondary | ICD-10-CM

## 2022-06-26 DIAGNOSIS — H35033 Hypertensive retinopathy, bilateral: Secondary | ICD-10-CM

## 2022-08-11 ENCOUNTER — Other Ambulatory Visit: Payer: Self-pay | Admitting: Adult Health

## 2022-08-11 DIAGNOSIS — R928 Other abnormal and inconclusive findings on diagnostic imaging of breast: Secondary | ICD-10-CM

## 2022-09-02 ENCOUNTER — Ambulatory Visit
Admission: RE | Admit: 2022-09-02 | Discharge: 2022-09-02 | Disposition: A | Discharge status: Home / Self Care | Payer: 59 | Source: Ambulatory Visit | Attending: Internal Medicine | Admitting: Internal Medicine

## 2022-09-02 DIAGNOSIS — E2839 Other primary ovarian failure: Secondary | ICD-10-CM

## 2022-09-07 ENCOUNTER — Ambulatory Visit (INDEPENDENT_AMBULATORY_CARE_PROVIDER_SITE_OTHER): Payer: 59 | Admitting: Podiatry

## 2022-09-07 DIAGNOSIS — B351 Tinea unguium: Secondary | ICD-10-CM

## 2022-09-07 DIAGNOSIS — E1142 Type 2 diabetes mellitus with diabetic polyneuropathy: Secondary | ICD-10-CM | POA: Diagnosis not present

## 2022-09-07 DIAGNOSIS — M79675 Pain in left toe(s): Secondary | ICD-10-CM | POA: Diagnosis not present

## 2022-09-07 DIAGNOSIS — M79674 Pain in right toe(s): Secondary | ICD-10-CM

## 2022-09-07 NOTE — Progress Notes (Signed)
  Subjective:  Patient ID: Carolyn Weber, female    DOB: February 11, 1941,  MRN: [id number]  Carolyn Weber presents to clinic today for at risk foot care with history of diabetic neuropathy and painful thick toenails that are difficult to trim. Pain interferes with ambulation. Aggravating factors include wearing enclosed shoe gear. Pain is relieved with periodic professional debridement.  Chief Complaint  Patient presents with   Nail Problem    RFC Bilateral nail trim.  A1c: 6.2 Pt does not check daily glucose.    New problem(s): None.   PCP is Carolyn Carol, MD.  Allergies  Allergen Reactions   Aspirin     Other reaction(s): stomach upset    Review of Systems: Negative except as noted in the HPI.  Objective: No changes noted in today's physical examination.  Carolyn Weber is a pleasant 81 y.o. female in NAD. AAO x 3.  Vascular Examination: Vascular status intact b/l with palpable pedal pulses. Pedal hair present b/l. CFT immediate b/l. No pain with calf compression b/l. Skin temperature gradient WNL b/l. Trace edema noted BLE.  Neurological Examination: Sensation grossly intact b/l with 10 gram monofilament. Vibratory sensation intact b/l. Pt has subjective symptoms of neuropathy.  Dermatological Examination: Pedal skin with normal turgor, texture and tone b/l. Toenails 1-5 b/l thick, discolored, elongated with subungual debris and pain on dorsal palpation. No hyperkeratotic lesions noted b/l.   Musculoskeletal Examination: Muscle strength 5/5 to b/l LE. HAV with bunion deformity R>L. Hammertoe deformity noted 2-5 b/l.  Radiographs: None Assessment/Plan: 1. Pain due to onychomycosis of toenails of both feet   2. Diabetic peripheral neuropathy associated with type 2 diabetes mellitus ([place])     No orders of the defined types were placed in this encounter.   -Consent given for treatment as described below: -Examined patient. -Continue supportive shoe gear daily. -Mycotic  toenails 1-5 bilaterally were debrided in length and girth with sterile nail nippers and dremel without incident. -Patient/POA to call should there be question/concern in the interim.   Return in about 3 months (around 12/08/2022).  Carolyn Weber, DPM

## 2022-09-08 ENCOUNTER — Ambulatory Visit: Payer: 59 | Admitting: Podiatry

## 2022-09-11 ENCOUNTER — Encounter: Payer: Self-pay | Admitting: Podiatry

## 2022-10-08 ENCOUNTER — Ambulatory Visit
Admission: RE | Admit: 2022-10-08 | Discharge: 2022-10-08 | Disposition: A | Discharge status: Home / Self Care | Payer: 59 | Source: Ambulatory Visit | Attending: Adult Health | Admitting: Adult Health

## 2022-10-08 DIAGNOSIS — R928 Other abnormal and inconclusive findings on diagnostic imaging of breast: Secondary | ICD-10-CM

## 2022-10-28 ENCOUNTER — Telehealth: Payer: Self-pay | Admitting: Hematology and Oncology

## 2022-10-28 NOTE — Telephone Encounter (Signed)
Rescheduled appointment per provider [facility]. Patient is aware of the changes made to her upcoming appointment. 

## 2022-11-18 ENCOUNTER — Ambulatory Visit: Payer: 59 | Admitting: Hematology and Oncology

## 2022-11-20 ENCOUNTER — Inpatient Hospital Stay: Payer: 59 | Attending: Hematology and Oncology | Admitting: Hematology and Oncology

## 2022-11-20 VITALS — BP 147/67 | HR 82 | Temp 97.7°F | Resp 15 | Wt 185.5 lb

## 2022-11-20 DIAGNOSIS — Z170 Estrogen receptor positive status [ER+]: Secondary | ICD-10-CM

## 2022-11-20 DIAGNOSIS — C50211 Malignant neoplasm of upper-inner quadrant of right female breast: Secondary | ICD-10-CM | POA: Diagnosis not present

## 2022-11-20 DIAGNOSIS — Z79811 Long term (current) use of aromatase inhibitors: Secondary | ICD-10-CM | POA: Diagnosis not present

## 2022-11-20 NOTE — Progress Notes (Signed)
Patient Care Team: Seward Carol, MD as PCP - General (Internal Medicine) Fanny Skates, MD as Consulting Physician (General Surgery) Nicholas Lose, MD as Consulting Physician (Hematology and Oncology)  DIAGNOSIS:  Encounter Diagnosis  Name Primary?   Malignant neoplasm of upper-inner quadrant of right breast in female, estrogen receptor positive ([place]) Yes    SUMMARY OF ONCOLOGIC HISTORY: Oncology History  Malignant neoplasm of upper-inner quadrant of right breast in female, estrogen receptor positive ([place])  09/21/2018 Initial Diagnosis   Screening mammogram detected right breast mass 2:30 position.  3 cm from nipple 0.9 x 0.6 x 0.8 cm, ultrasound axilla negative biopsy revealed grade 1-2 IDC ER 95%, PR 90%, HER-2 1+ negative, Ki-67 5%, T1b N0 stage Ia clinical stage   10/10/2018 Cancer Staging   Staging form: Breast, AJCC 8th Edition - Clinical: Stage IA (cT1b, cN0, cM0, G2, ER+, PR+, HER2-) - Signed by Nicholas Lose, MD on 10/10/2018   10/28/2018 Genetic Testing   Genetic testing showed no pathogenic mutations. 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  Genes tested include: AIP, ALK, APC, ATM, AXIN2,BAP1,  BARD1, BLM, BMPR1A, BRCA1, BRCA2, BRIP1, CASR, CDC73, CDH1, CDK4, CDKN1B, CDKN1C, CDKN2A (p14ARF), CDKN2A (p16INK4a), CEBPA, CHEK2, CTNNA1, DICER1, DIS3L2, EGFR (c.2369C>T, p.Thr790Met variant only), EPCAM (Deletion/duplication testing only), FH, FLCN, GATA2, GPC3, GREM1 (Promoter region deletion/duplication testing only), HOXB13 (c.251G>A, p.Gly84Glu), HRAS, KIT, MAX, MEN1, MET, MITF (c.952G>A, p.Glu318Lys variant only), MLH1, MSH2, MSH3, MSH6, MUTYH, NBN, NF1, NF2, NTHL1, PALB2, PDGFRA, PHOX2B, PMS2, POLD1, POLE, POT1, PRKAR1A, PTCH1, PTEN, RAD50, RAD51C, RAD51D, RB1, RECQL4, RET, RUNX1, SDHAF2, SDHA (sequence changes only), SDHB, SDHC, SDHD, SMAD4, SMARCA4, SMARCB1, SMARCE1, STK11, SUFU, TERC, TERT, TMEM127, TP53, TSC1, TSC2, VHL, WRN and WT1.       01/06/2019 Surgery   Right lumpectomy: IDC, grade 1, 0.8 cm, margins negative, negative for LV I or PNI, ER 95%, PR 90%, HER-2 negative, Ki-67 5% T1BN0 stage Ia   01/06/2019 Cancer Staging   Staging form: Breast, AJCC 8th Edition - Pathologic stage from 01/06/2019: Stage IA (pT1b, pN0, cM0, G1, ER+, PR+, HER2-) - Signed by Gardenia Phlegm, NP on 01/25/2019   02/2019 -  Anti-estrogen oral therapy   Anastrozole daily     CHIEF COMPLIANT:  Follow-up of right breast cancer on anastrozole   INTERVAL HISTORY: Carolyn Weber is a 82 y.o. with above-mentioned history of right breast cancer who underwent a lumpectomy and is currently on antiestrogen therapy with anastrozole.  She presents to the clinic for a follow-up. She states that she is tolerating the anastrozole exteremly well with no side effects or complaints. She denies any pain or discomfort in breast.  ALLERGIES:  is allergic to aspirin.  MEDICATIONS:  Current Outpatient Medications  Medication Sig Dispense Refill   acetaminophen (TYLENOL) 650 MG CR tablet Take 650 mg by mouth every 8 (eight) hours as needed for pain.     amLODipine (NORVASC) 10 MG tablet Take 1 tablet by mouth daily.     anastrozole (ARIMIDEX) 1 MG tablet 1 tablet     Ascorbic Acid (VITAMIN C) 500 MG CHEW 1 tablet     atorvastatin (LIPITOR) 40 MG tablet Take 1 tablet by mouth every evening.     Besifloxacin HCl (BESIVANCE) 0.6 % SUSP Place 1 drop into the right eye See admin instructions. Drop 1 drop in the right eye the day of and the day after eye injection.     calcium carbonate (OS-CAL) 600 MG TABS  Take 600 mg by mouth 2 (two) times daily with a meal.     cetirizine (ZYRTEC ALLERGY) 10 MG tablet 1 tablet     cholecalciferol (VITAMIN D) 1000 UNITS tablet Take 1,000 Units by mouth 2 (two) times daily.      clobetasol cream (TEMOVATE) 7.93 % Apply 1 application topically daily as needed (irritation).      clopidogrel (PLAVIX) 75 MG tablet Take 1 tablet by  mouth daily.     metFORMIN (GLUCOPHAGE) 500 MG tablet Take 1 tablet (500 mg total) by mouth 2 (two) times daily with a meal.     Multiple Vitamin (MULTIVITAMIN) tablet Take 0.5 tablets by mouth 2 (two) times daily.      OVER THE COUNTER MEDICATION Take 1 tablet by mouth 2 (two) times daily. Viviscal Hair Growth Supplement     Polyethyl Glycol-Propyl Glycol (SYSTANE OP) Apply 1 drop to eye every morning.     Vibegron (GEMTESA) 75 MG TABS Take 1 tablet by mouth daily. 30 tablet    No current facility-administered medications for this visit.    PHYSICAL EXAMINATION: ECOG PERFORMANCE STATUS: 1 - Symptomatic but completely ambulatory  Vitals:   11/20/22 1037  BP: (!) 147/67  Pulse: 82  Resp: 15  Temp: 97.7 F (36.5 C)  SpO2: 99%   Filed Weights   11/20/22 1037  Weight: 185 lb 8 oz (84.1 kg)    BREAST: No palpable masses or nodules in either right or left breasts. No palpable axillary supraclavicular or infraclavicular adenopathy no breast tenderness or nipple discharge. (exam performed in the presence of a chaperone)  LABORATORY DATA:  I have reviewed the data as listed    Latest Ref Rng & Units 12/30/2018    1:24 PM 12/13/2018    9:45 AM 07/09/2014   12:43 PM  CMP  Glucose 70 - 99 mg/dL 119   209   BUN 8 - 23 mg/dL 19   43   Creatinine 0.44 - 1.00 mg/dL 1.12  1.20  2.45   Sodium 135 - 145 mmol/L 136   135   Potassium 3.5 - 5.1 mmol/L 4.2   3.7   Chloride 98 - 111 mmol/L 103   96   CO2 22 - 32 mmol/L 25   28   Calcium 8.9 - 10.3 mg/dL 10.0   9.7   Total Protein 6.5 - 8.1 g/dL 7.5   7.0   Total Bilirubin 0.3 - 1.2 mg/dL 0.5   0.7   Alkaline Phos 38 - 126 U/L 51   63   AST 15 - 41 U/L 23   23   ALT 0 - 44 U/L 16   12     Lab Results  Component Value Date   WBC 5.7 12/30/2018   HGB 12.9 12/30/2018   HCT 41.3 12/30/2018   MCV 86.2 12/30/2018   PLT 218 12/30/2018   NEUTROABS 3.8 12/30/2018    ASSESSMENT & PLAN:  Malignant neoplasm of upper-inner quadrant of right  breast in female, estrogen receptor positive ([place]) 01/06/2019:Right lumpectomy: IDC, grade 1, 0.8 cm, margins negative, negative for LV I or PNI, ER 95%, PR 90%, HER-2 negative, Ki-67 5% T1BN0 stage Ia Given the favorable prognostic features and her age, radiation was omitted Current treatment: Anastrozole started 02/20/2019   Anastrozole toxicities: Denies any adverse effects to anastrozole.   Breast cancer surveillance: 1.  Mammogram 10/08/2022: Benign, breast density category C 2.  Breast exam 11/20/2022: Benign 3.  Bone density 09/02/2022: T score -  0.1: Normal   RTC in 1 year    No orders of the defined types were placed in this encounter.  The patient has a good understanding of the overall plan. she agrees with it. she will call with any problems that may develop before the next visit here. Total time spent: 30 mins including face to face time and time spent for planning, charting and co-ordination of care   Harriette Ohara, MD 11/20/22    I Gardiner Coins am acting as a Education administrator for [employer]  I have reviewed the above documentation for accuracy and completeness, and I agree with the above.

## 2022-11-20 NOTE — Assessment & Plan Note (Signed)
01/06/2019:Right lumpectomy: IDC, grade 1, 0.8 cm, margins negative, negative for LV I or PNI, ER 95%, PR 90%, HER-2 negative, Ki-67 5% T1BN0 stage Ia Given the favorable prognostic features and her age, radiation was omitted Current treatment: Anastrozole started 02/20/2019   Anastrozole toxicities: Denies any adverse effects to anastrozole.   Breast cancer surveillance: 1.  Mammogram 10/08/2022: Benign, breast density category C 2.  Breast exam 11/20/2022: Benign 3.  Bone density 09/02/2022: T score -0.1: Normal   RTC in 1 year

## 2023-01-01 ENCOUNTER — Encounter: Payer: Self-pay | Admitting: Podiatry

## 2023-01-01 ENCOUNTER — Ambulatory Visit (INDEPENDENT_AMBULATORY_CARE_PROVIDER_SITE_OTHER): Payer: 59 | Admitting: Podiatry

## 2023-01-01 ENCOUNTER — Ambulatory Visit: Payer: 59 | Admitting: Podiatry

## 2023-01-01 DIAGNOSIS — B351 Tinea unguium: Secondary | ICD-10-CM | POA: Diagnosis not present

## 2023-01-01 DIAGNOSIS — E1142 Type 2 diabetes mellitus with diabetic polyneuropathy: Secondary | ICD-10-CM

## 2023-01-01 DIAGNOSIS — M79675 Pain in left toe(s): Secondary | ICD-10-CM | POA: Diagnosis not present

## 2023-01-01 DIAGNOSIS — M79674 Pain in right toe(s): Secondary | ICD-10-CM | POA: Diagnosis not present

## 2023-01-01 NOTE — Progress Notes (Signed)
  Subjective:  Patient ID: Carolyn Weber, female    DOB: Feb 06, 1941,  MRN: [id number]  Carolyn Weber presents to clinic today for painful elongated mycotic toenails 1-5 bilaterally which are tender when wearing enclosed shoe gear. Pain is relieved with periodic professional debridement.  Chief Complaint  Patient presents with   Nail Problem    DFC BS-do not check A1C-6.3 PCP-[facility] PCP VST- 6 months ago   New problem(s): None.   PCP is Seward Carol, MD.  Allergies  Allergen Reactions   Aspirin     Other reaction(s): stomach upset    Review of Systems: Negative except as noted in the HPI.  Objective: No changes noted in today's physical examination. There were no vitals filed for this visit. Carolyn Weber is a pleasant 82 y.o. female WD, WN in NAD. AAO x 3.  Vascular Examination: Vascular status intact b/l with palpable pedal pulses. Pedal hair present b/l. CFT immediate b/l. No pain with calf compression b/l. Skin temperature gradient WNL b/l. Trace edema noted BLE.  Neurological Examination: Sensation grossly intact b/l with 10 gram monofilament. Vibratory sensation intact b/l. Pt has subjective symptoms of neuropathy.  Dermatological Examination: Pedal skin with normal turgor, texture and tone b/l. Toenails 1-5 b/l thick, discolored, elongated with subungual debris and pain on dorsal palpation. No hyperkeratotic lesions noted b/l.   Musculoskeletal Examination: Muscle strength 5/5 to b/l LE. HAV with bunion deformity R>L. Hammertoe deformity noted 2-5 b/l.  Radiographs: None  Assessment/Plan: 1. Pain due to onychomycosis of toenails of both feet   2. Diabetic peripheral neuropathy associated with type 2 diabetes mellitus ([place])    -Patient was evaluated and treated. All patient's and/or POA's questions/concerns answered on today's visit. -Continue foot and shoe inspections daily. Monitor blood glucose per PCP/Endocrinologist's recommendations. -Patient to  continue soft, supportive shoe gear daily. -Toenails 1-5 b/l were debrided in length and girth with sterile nail nippers and dremel without iatrogenic bleeding.  -Patient/POA to call should there be question/concern in the interim.   Return in about 9 weeks (around 03/05/2023).  Marzetta Board, DPM

## 2023-02-01 ENCOUNTER — Other Ambulatory Visit: Payer: Self-pay | Admitting: *Deleted

## 2023-02-01 MED ORDER — ANASTROZOLE 1 MG PO TABS: ORAL_TABLET | ORAL | 3 refills | Status: DC

## 2023-02-03 ENCOUNTER — Other Ambulatory Visit: Payer: Self-pay | Admitting: *Deleted

## 2023-02-03 MED ORDER — ANASTROZOLE 1 MG PO TABS: ORAL_TABLET | ORAL | 3 refills | Status: DC

## 2023-03-09 ENCOUNTER — Ambulatory Visit (INDEPENDENT_AMBULATORY_CARE_PROVIDER_SITE_OTHER): Payer: 59 | Admitting: Podiatry

## 2023-03-09 ENCOUNTER — Encounter: Payer: Self-pay | Admitting: Podiatry

## 2023-03-09 DIAGNOSIS — M79674 Pain in right toe(s): Secondary | ICD-10-CM | POA: Diagnosis not present

## 2023-03-09 DIAGNOSIS — M79675 Pain in left toe(s): Secondary | ICD-10-CM | POA: Diagnosis not present

## 2023-03-09 DIAGNOSIS — B351 Tinea unguium: Secondary | ICD-10-CM

## 2023-03-09 DIAGNOSIS — E1142 Type 2 diabetes mellitus with diabetic polyneuropathy: Secondary | ICD-10-CM

## 2023-03-09 NOTE — Progress Notes (Signed)
  Subjective:  Patient ID: Carolyn Weber, female    DOB: [birth year],   MRN: [id number]  Chief Complaint  Patient presents with   Diabetes    Diabetic foot care    82 y.o. female presents for concern of thickened elongated and painful nails that are difficult to trim. Requesting to have them trimmed today. Relates burning and tingling in their feet. Patient is diabetic and last A1c was  Lab Results  Component Value Date   HGBA1C 6.9 (H) 12/30/2018   .   PCP:  Renford Dills, MD    . Denies any other pedal complaints. Denies n/v/f/c.   Past Medical History:  Diagnosis Date   Allergy    Arthritis    Cancer ([facility]) 10/2018   breast right, receptor positive   Chronic kidney disease    Stage 2-3   Colon polyps    Complication of anesthesia    Hard to wake up   Diabetic retinopathy ([facility])    Type II   Difficulty in urination    Diverticulitis    Family history of breast cancer    Family history of lung cancer    Family history of prostate cancer    Hyperlipemia    Hypertension    Obesity    Pre-diabetes    TIA (transient ischemic attack)     Objective:  Physical Exam: Vascular: DP/PT pulses 2/4 bilateral. CFT <3 seconds. Absent hair growth on digits. Edema noted to left lower extremity. . Xerosis noted bilaterally.  Skin. No lacerations or abrasions bilateral feet. Nails 1-5 bilateral  are thickened discolored and elongated with subungual debris.  Musculoskeletal: MMT 5/5 bilateral lower extremities in DF, PF, Inversion and Eversion. Deceased ROM in DF of ankle joint.  Neurological: Sensation intact to light touch. Protective sensation diminished bilateral.    Assessment:   1. Pain due to onychomycosis of toenails of both feet   2. Diabetic peripheral neuropathy associated with type 2 diabetes mellitus ([facility])      Plan:  Patient was evaluated and treated and all questions answered. -Discussed and educated patient on diabetic foot care, especially with  regards to  the vascular, neurological and musculoskeletal systems.  -Stressed the importance of good glycemic control and the detriment of not  controlling glucose levels in relation to the foot. -Discussed supportive shoes at all times and checking feet regularly.  -Mechanically debrided all nails 1-5 bilateral using sterile nail nipper and filed with dremel without incident  -Answered all patient questions -Patient to return  in 3 months for at risk foot care -Patient advised to call the office if any problems or questions arise in the meantime.   Louann Sjogren, DPM

## 2023-04-02 ENCOUNTER — Encounter (INDEPENDENT_AMBULATORY_CARE_PROVIDER_SITE_OTHER): Payer: 59 | Admitting: Ophthalmology

## 2023-04-02 DIAGNOSIS — H348312 Tributary (branch) retinal vein occlusion, right eye, stable: Secondary | ICD-10-CM

## 2023-04-02 DIAGNOSIS — Z7984 Long term (current) use of oral hypoglycemic drugs: Secondary | ICD-10-CM

## 2023-04-02 DIAGNOSIS — E113292 Type 2 diabetes mellitus with mild nonproliferative diabetic retinopathy without macular edema, left eye: Secondary | ICD-10-CM

## 2023-04-02 DIAGNOSIS — H35033 Hypertensive retinopathy, bilateral: Secondary | ICD-10-CM | POA: Diagnosis not present

## 2023-04-02 DIAGNOSIS — E113391 Type 2 diabetes mellitus with moderate nonproliferative diabetic retinopathy without macular edema, right eye: Secondary | ICD-10-CM | POA: Diagnosis not present

## 2023-04-02 DIAGNOSIS — I10 Essential (primary) hypertension: Secondary | ICD-10-CM | POA: Diagnosis not present

## 2023-04-02 DIAGNOSIS — H2513 Age-related nuclear cataract, bilateral: Secondary | ICD-10-CM

## 2023-05-12 ENCOUNTER — Encounter: Payer: Self-pay | Admitting: Podiatry

## 2023-05-12 ENCOUNTER — Ambulatory Visit (INDEPENDENT_AMBULATORY_CARE_PROVIDER_SITE_OTHER): Payer: 59 | Admitting: Podiatry

## 2023-05-12 VITALS — BP 148/71

## 2023-05-12 DIAGNOSIS — M79674 Pain in right toe(s): Secondary | ICD-10-CM | POA: Diagnosis not present

## 2023-05-12 DIAGNOSIS — M79675 Pain in left toe(s): Secondary | ICD-10-CM

## 2023-05-12 DIAGNOSIS — B351 Tinea unguium: Secondary | ICD-10-CM

## 2023-05-12 DIAGNOSIS — E1142 Type 2 diabetes mellitus with diabetic polyneuropathy: Secondary | ICD-10-CM | POA: Diagnosis not present

## 2023-05-16 NOTE — Progress Notes (Signed)
  Subjective:  Patient ID: Carolyn Weber, female    DOB: 25-Sep-1941,  MRN: [id number]  Carolyn Weber presents to clinic today for at risk foot care with history of diabetic neuropathy and painful thick toenails that are difficult to trim. Pain interferes with ambulation. Aggravating factors include wearing enclosed shoe gear. Pain is relieved with periodic professional debridement.  Chief Complaint  Patient presents with   Nail Problem     Routine foot care   New problem(s): None.   PCP is Renford Dills, MD.  Allergies  Allergen Reactions   Aspirin     Other reaction(s): stomach upset    Review of Systems: Negative except as noted in the HPI.  Objective: No changes noted in today's physical examination. Vitals:   05/12/23 1137  BP: (!) 148/71   Carolyn Weber is a pleasant 82 y.o. female WD, WN in NAD. AAO x 3.  Vascular Examination: Capillary refill time immediate b/l. Vascular status intact b/l with palpable pedal pulses. Pedal hair absent b/l. No pain with calf compression b/l. Skin temperature gradient WNL b/l. No cyanosis or clubbing b/l. No ischemia or gangrene noted b/l.   Neurological Examination: Sensation grossly intact b/l with 10 gram monofilament. Vibratory sensation intact b/l.   Dermatological Examination: Pedal skin with normal turgor, texture and tone b/l.  No open wounds. No interdigital macerations.   Toenails 1-5 b/l thick, discolored, elongated with subungual debris and pain on dorsal palpation.   No hyperkeratotic nor porokeratotic lesions present on today's visit.  Musculoskeletal Examination: Muscle strength 5/5 to all lower extremity muscle groups bilaterally. HAV with bunion bilaterally and hammertoes 2-5 b/l.  Radiographs: None  Assessment/Plan: 1. Pain due to onychomycosis of toenails of both feet   2. Diabetic peripheral neuropathy associated with type 2 diabetes mellitus (HCC)    Patient was evaluated and treated. All patient's  and/or POA's questions/concerns addressed on today's visit. Mycotic toenails 1-5 debrided in length and girth without incident. Continue soft, supportive shoe gear daily. Report any pedal injuries to medical professional. Call office if there are any quesitons/concerns. -Continue foot and shoe inspections daily. Monitor blood glucose per PCP/Endocrinologist's recommendations. -Patient to continue soft, supportive shoe gear daily. -Patient/POA to call should there be question/concern in the interim.   Return in about 3 months (around 08/12/2023).  Freddie Breech, DPM

## 2023-07-21 ENCOUNTER — Ambulatory Visit: Payer: 59 | Admitting: Podiatry

## 2023-07-21 ENCOUNTER — Ambulatory Visit (INDEPENDENT_AMBULATORY_CARE_PROVIDER_SITE_OTHER): Payer: 59 | Admitting: Podiatry

## 2023-07-21 ENCOUNTER — Encounter: Payer: Self-pay | Admitting: Podiatry

## 2023-07-21 DIAGNOSIS — B351 Tinea unguium: Secondary | ICD-10-CM

## 2023-07-21 DIAGNOSIS — M79674 Pain in right toe(s): Secondary | ICD-10-CM | POA: Diagnosis not present

## 2023-07-21 DIAGNOSIS — M79675 Pain in left toe(s): Secondary | ICD-10-CM | POA: Diagnosis not present

## 2023-07-21 DIAGNOSIS — E1142 Type 2 diabetes mellitus with diabetic polyneuropathy: Secondary | ICD-10-CM | POA: Diagnosis not present

## 2023-07-21 NOTE — Progress Notes (Signed)
This patient returns to my office for at risk foot care.  This patient requires this care by a professional since this patient will be at risk due to having diabetes and CKD. and coagulation defect due to plavix.  This patient is unable to cut nails herself since the patient cannot reach her nails.These nails are painful walking and wearing shoes.  This patient presents for at risk foot care today.  General Appearance  Alert, conversant and in no acute stress.  Vascular  Dorsalis pedis and posterior tibial  pulses are palpable  bilaterally.  Capillary return is within normal limits  bilaterally. Temperature is within normal limits  bilaterally.  Neurologic  Senn-Weinstein monofilament wire test within normal limits  bilaterally. Muscle power within normal limits bilaterally.  Nails Thick disfigured discolored nails with subungual debris  from hallux to fifth toes bilaterally. No evidence of bacterial infection or drainage bilaterally.  Orthopedic  No limitations of motion  feet .  No crepitus or effusions noted.  No bony pathology or digital deformities noted. HAV  B/L.  Skin  normotropic skin with no porokeratosis noted bilaterally.  No signs of infections or ulcers noted.     Onychomycosis  Pain in right toes  Pain in left toes  Consent was obtained for treatment procedures.   Mechanical debridement of nails 1-5  bilaterally performed with a nail nipper.  Filed with dremel without incident.    Return office visit   9 weeks per patient.                  Told patient to return for periodic foot care and evaluation due to potential at risk complications.   Helane Gunther DPM

## 2023-07-28 ENCOUNTER — Ambulatory Visit: Payer: 59 | Admitting: Podiatry

## 2023-08-25 ENCOUNTER — Other Ambulatory Visit: Payer: Self-pay | Admitting: Internal Medicine

## 2023-08-25 DIAGNOSIS — Z1231 Encounter for screening mammogram for malignant neoplasm of breast: Secondary | ICD-10-CM

## 2023-09-06 ENCOUNTER — Other Ambulatory Visit: Payer: Self-pay | Admitting: Internal Medicine

## 2023-09-06 DIAGNOSIS — Z13820 Encounter for screening for osteoporosis: Secondary | ICD-10-CM

## 2023-09-22 ENCOUNTER — Encounter: Payer: Self-pay | Admitting: Podiatry

## 2023-09-22 ENCOUNTER — Ambulatory Visit (INDEPENDENT_AMBULATORY_CARE_PROVIDER_SITE_OTHER): Payer: 59 | Admitting: Podiatry

## 2023-09-22 VITALS — Ht 64.0 in | Wt 185.5 lb

## 2023-09-22 DIAGNOSIS — M79674 Pain in right toe(s): Secondary | ICD-10-CM | POA: Diagnosis not present

## 2023-09-22 DIAGNOSIS — B351 Tinea unguium: Secondary | ICD-10-CM | POA: Diagnosis not present

## 2023-09-22 DIAGNOSIS — E1142 Type 2 diabetes mellitus with diabetic polyneuropathy: Secondary | ICD-10-CM

## 2023-09-22 DIAGNOSIS — M79675 Pain in left toe(s): Secondary | ICD-10-CM | POA: Diagnosis not present

## 2023-09-28 NOTE — Progress Notes (Signed)
  Subjective:  Patient ID: Carolyn Weber, female    DOB: June 10, 1941,  MRN: [id number]  82 y.o. female presents at risk foot care with history of diabetic neuropathy and painful elongated mycotic toenails 1-5 bilaterally which are tender when wearing enclosed shoe gear. Pain is relieved with periodic professional debridement.  Chief Complaint  Patient presents with   Nail Problem    Pt is here for [facility], last A1C was 6.2, PCP is Dr Nehemiah Settle and LOV was in October.   New problem(s): None   PCP is Renford Dills, MD.  Allergies  Allergen Reactions   Aspirin     Other reaction(s): stomach upset    Review of Systems: Negative except as noted in the HPI.   Objective:  Carolyn Weber is a pleasant 82 y.o. female in NAD. AAO x 3.  Vascular Examination: Vascular status intact b/l with palpable pedal pulses. CFT immediate b/l. Pedal hair diminished. No edema. No pain with calf compression b/l. Skin temperature gradient WNL b/l. No varicosities noted. No cyanosis or clubbing noted.  Neurological Examination: Sensation grossly intact b/l with 10 gram monofilament. Vibratory sensation intact b/l.  Dermatological Examination: Pedal skin with normal turgor, texture and tone b/l. No open wounds nor interdigital macerations noted. Toenails 1-5 b/l thick, discolored, elongated with subungual debris and pain on dorsal palpation. No hyperkeratotic lesions noted b/l.   Musculoskeletal Examination: Muscle strength 5/5 to all lower extremity muscle groups bilaterally. HAV with bunion bilaterally and hammertoes 2-5 b/l.  Radiographs: None  Assessment:   1. Pain due to onychomycosis of toenails of both feet   2. Diabetic peripheral neuropathy associated with type 2 diabetes mellitus (HCC)    Plan:  -Consent given for treatment as described below: -Examined patient. -Continue supportive shoe gear daily. -Toenails 1-5 b/l were debrided in length and girth with sterile nail nippers and dremel  without iatrogenic bleeding.  -Patient/POA to call should there be question/concern in the interim.  Return in about 9 weeks (around 11/24/2023).  Freddie Breech, DPM      [place] LOCATION: [address], [place] [address]                   Office [phone]   [facility] LOCATION: [address] [place], [place] [address] Office [phone]

## 2023-10-13 ENCOUNTER — Ambulatory Visit: Payer: 59

## 2023-11-10 ENCOUNTER — Ambulatory Visit
Admission: RE | Admit: 2023-11-10 | Discharge: 2023-11-10 | Disposition: A | Discharge status: Home / Self Care | Payer: 59 | Source: Ambulatory Visit | Attending: Internal Medicine | Admitting: Internal Medicine

## 2023-11-10 DIAGNOSIS — Z1231 Encounter for screening mammogram for malignant neoplasm of breast: Secondary | ICD-10-CM

## 2023-11-12 ENCOUNTER — Other Ambulatory Visit: Payer: Self-pay | Admitting: Internal Medicine

## 2023-11-12 ENCOUNTER — Other Ambulatory Visit: Payer: Self-pay | Admitting: Hematology and Oncology

## 2023-11-12 DIAGNOSIS — R928 Other abnormal and inconclusive findings on diagnostic imaging of breast: Secondary | ICD-10-CM

## 2023-11-22 ENCOUNTER — Ambulatory Visit: Payer: 59 | Admitting: Hematology and Oncology

## 2023-11-24 ENCOUNTER — Encounter: Payer: Self-pay | Admitting: Podiatry

## 2023-11-24 ENCOUNTER — Other Ambulatory Visit: Payer: Self-pay | Admitting: Hematology and Oncology

## 2023-11-24 ENCOUNTER — Ambulatory Visit (INDEPENDENT_AMBULATORY_CARE_PROVIDER_SITE_OTHER): Payer: 59 | Admitting: Podiatry

## 2023-11-24 ENCOUNTER — Ambulatory Visit
Admission: RE | Admit: 2023-11-24 | Discharge: 2023-11-24 | Disposition: A | Discharge status: Home / Self Care | Payer: 59 | Source: Ambulatory Visit | Attending: Internal Medicine | Admitting: Internal Medicine

## 2023-11-24 DIAGNOSIS — R928 Other abnormal and inconclusive findings on diagnostic imaging of breast: Secondary | ICD-10-CM

## 2023-11-24 DIAGNOSIS — M79675 Pain in left toe(s): Secondary | ICD-10-CM | POA: Diagnosis not present

## 2023-11-24 DIAGNOSIS — E1142 Type 2 diabetes mellitus with diabetic polyneuropathy: Secondary | ICD-10-CM

## 2023-11-24 DIAGNOSIS — M79674 Pain in right toe(s): Secondary | ICD-10-CM | POA: Diagnosis not present

## 2023-11-24 DIAGNOSIS — B351 Tinea unguium: Secondary | ICD-10-CM

## 2023-11-25 ENCOUNTER — Inpatient Hospital Stay: Payer: 59 | Attending: Hematology and Oncology | Admitting: Hematology and Oncology

## 2023-11-25 VITALS — BP 153/49 | HR 76 | Temp 97.6°F | Resp 18 | Ht 64.0 in | Wt 191.8 lb

## 2023-11-25 DIAGNOSIS — Z8673 Personal history of transient ischemic attack (TIA), and cerebral infarction without residual deficits: Secondary | ICD-10-CM | POA: Diagnosis not present

## 2023-11-25 DIAGNOSIS — Z7984 Long term (current) use of oral hypoglycemic drugs: Secondary | ICD-10-CM | POA: Insufficient documentation

## 2023-11-25 DIAGNOSIS — C50211 Malignant neoplasm of upper-inner quadrant of right female breast: Secondary | ICD-10-CM

## 2023-11-25 DIAGNOSIS — Z170 Estrogen receptor positive status [ER+]: Secondary | ICD-10-CM | POA: Diagnosis not present

## 2023-11-25 DIAGNOSIS — E119 Type 2 diabetes mellitus without complications: Secondary | ICD-10-CM | POA: Insufficient documentation

## 2023-11-25 DIAGNOSIS — Z79811 Long term (current) use of aromatase inhibitors: Secondary | ICD-10-CM | POA: Insufficient documentation

## 2023-11-25 MED ORDER — METFORMIN HCL ER 500 MG PO TB24: 500.0000 mg | ORAL_TABLET | Freq: Every day | ORAL | Status: AC

## 2023-11-25 NOTE — Assessment & Plan Note (Signed)
01/06/2019:Right lumpectomy: IDC, grade 1, 0.8 cm, margins negative, negative for LV I or PNI, ER 95%, PR 90%, HER-2 negative, Ki-67 5% T1BN0 stage Ia Given the favorable prognostic features and her age, radiation was omitted Current treatment: Anastrozole started 02/20/2019   Anastrozole toxicities: Denies any adverse effects to anastrozole.   Breast cancer surveillance: 1.  Mammogram 11/11/2023: Calcifications right breast, diagnostic right breast mammogram 11/24/2023: 0.8 cm group of calcifications, stereotactic biopsy recommended 2.  Breast exam 11/25/2023: Benign 3.  Bone density 09/02/2022: T score -0.1: Normal   RTC in 1 year

## 2023-11-25 NOTE — Progress Notes (Signed)
Patient Care Team: Serena Croissant, MD as PCP - General (Hematology and Oncology) Claud Kelp, MD as Consulting Physician (General Surgery) Serena Croissant, MD as Consulting Physician (Hematology and Oncology)  DIAGNOSIS:  Encounter Diagnosis  Name Primary?   Malignant neoplasm of upper-inner quadrant of right breast in female, estrogen receptor positive (HCC) Yes    SUMMARY OF ONCOLOGIC HISTORY: Oncology History  Malignant neoplasm of upper-inner quadrant of right breast in female, estrogen receptor positive (HCC)  09/21/2018 Initial Diagnosis   Screening mammogram detected right breast mass 2:30 position.  3 cm from nipple 0.9 x 0.6 x 0.8 cm, ultrasound axilla negative biopsy revealed grade 1-2 IDC ER 95%, PR 90%, HER-2 1+ negative, Ki-67 5%, T1b N0 stage Ia clinical stage   10/10/2018 Cancer Staging   Staging form: Breast, AJCC 8th Edition - Clinical: Stage IA (cT1b, cN0, cM0, G2, ER+, PR+, HER2-) - Signed by Serena Croissant, MD on 10/10/2018   10/28/2018 Genetic Testing   Genetic testing showed no pathogenic mutations. 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  Genes tested include: AIP, ALK, APC, ATM, AXIN2,BAP1,  BARD1, BLM, BMPR1A, BRCA1, BRCA2, BRIP1, CASR, CDC73, CDH1, CDK4, CDKN1B, CDKN1C, CDKN2A (p14ARF), CDKN2A (p16INK4a), CEBPA, CHEK2, CTNNA1, DICER1, DIS3L2, EGFR (c.2369C>T, p.Thr790Met variant only), EPCAM (Deletion/duplication testing only), FH, FLCN, GATA2, GPC3, GREM1 (Promoter region deletion/duplication testing only), HOXB13 (c.251G>A, p.Gly84Glu), HRAS, KIT, MAX, MEN1, MET, MITF (c.952G>A, p.Glu318Lys variant only), MLH1, MSH2, MSH3, MSH6, MUTYH, NBN, NF1, NF2, NTHL1, PALB2, PDGFRA, PHOX2B, PMS2, POLD1, POLE, POT1, PRKAR1A, PTCH1, PTEN, RAD50, RAD51C, RAD51D, RB1, RECQL4, RET, RUNX1, SDHAF2, SDHA (sequence changes only), SDHB, SDHC, SDHD, SMAD4, SMARCA4, SMARCB1, SMARCE1, STK11, SUFU, TERC, TERT, TMEM127, TP53, TSC1, TSC2, VHL, WRN and  WT1.      01/06/2019 Surgery   Right lumpectomy: IDC, grade 1, 0.8 cm, margins negative, negative for LV I or PNI, ER 95%, PR 90%, HER-2 negative, Ki-67 5% T1BN0 stage Ia   01/06/2019 Cancer Staging   Staging form: Breast, AJCC 8th Edition - Pathologic stage from 01/06/2019: Stage IA (pT1b, pN0, cM0, G1, ER+, PR+, HER2-) - Signed by Loa Socks, NP on 01/25/2019   02/2019 -  Anti-estrogen oral therapy   Anastrozole daily     CHIEF COMPLIANT: Follow-up on anastrozole therapy, plan for biopsy  HISTORY OF PRESENT ILLNESS:   History of Present Illness   The patient, with a history of estrogen receptor positive breast cancer, presents for a follow-up after a recent mammogram. The mammogram revealed an area of calcification in the same breast where the patient previously had surgery for breast cancer. The patient expresses concern about the biopsy and potential implications if the calcifications are found to be malignant. The patient is currently on anastrozole, a medication for breast cancer, and is wondering if she will need to continue this medication given the recent findings.  In addition to the breast cancer history, the patient also has a history of a mini-stroke for which she is on Plavix, a blood thinner. The patient is concerned about the potential for increased bruising from the biopsy due to the blood thinner. The patient also mentions a family history of cancer, including a recent death of a cousin from pancreatic cancer.         ALLERGIES:  is allergic to aspirin.  MEDICATIONS:  Current Outpatient Medications  Medication Sig Dispense Refill   metFORMIN (GLUCOPHAGE-XR) 500 MG 24 hr tablet Take 1 tablet (500 mg total) by mouth daily with  breakfast.     acetaminophen (TYLENOL) 650 MG CR tablet Take 650 mg by mouth every 8 (eight) hours as needed for pain.     amLODipine (NORVASC) 10 MG tablet Take 1 tablet by mouth daily.     anastrozole (ARIMIDEX) 1 MG tablet 1 tablet  90 tablet 3   Ascorbic Acid (VITAMIN C) 500 MG CHEW 1 tablet     atorvastatin (LIPITOR) 40 MG tablet Take 1 tablet by mouth every evening.     Besifloxacin HCl (BESIVANCE) 0.6 % SUSP Place 1 drop into the right eye See admin instructions. Drop 1 drop in the right eye the day of and the day after eye injection.     calcium carbonate (OS-CAL) 600 MG TABS Take 600 mg by mouth 2 (two) times daily with a meal.     cetirizine (ZYRTEC ALLERGY) 10 MG tablet 1 tablet     cholecalciferol (VITAMIN D) 1000 UNITS tablet Take 1,000 Units by mouth 2 (two) times daily.      clobetasol cream (TEMOVATE) 0.05 % Apply 1 application topically daily as needed (irritation).      clopidogrel (PLAVIX) 75 MG tablet Take 1 tablet by mouth daily.     Multiple Vitamin (MULTIVITAMIN) tablet Take 0.5 tablets by mouth 2 (two) times daily.      OVER THE COUNTER MEDICATION Take 1 tablet by mouth 2 (two) times daily. Viviscal Hair Growth Supplement     Polyethyl Glycol-Propyl Glycol (SYSTANE OP) Apply 1 drop to eye every morning.     Vibegron (GEMTESA) 75 MG TABS Take 1 tablet by mouth daily. 30 tablet    No current facility-administered medications for this visit.    PHYSICAL EXAMINATION: ECOG PERFORMANCE STATUS: 1 - Symptomatic but completely ambulatory  Vitals:   11/25/23 1050  BP: (!) 153/49  Pulse: 76  Resp: 18  Temp: 97.6 F (36.4 C)  SpO2: 100%   Filed Weights   11/25/23 1050  Weight: 191 lb 12.8 oz (87 kg)     LABORATORY DATA:  I have reviewed the data as listed    Latest Ref Rng & Units 12/30/2018    1:24 PM 12/13/2018    9:45 AM 07/09/2014   12:43 PM  CMP  Glucose 70 - 99 mg/dL 161   096   BUN 8 - 23 mg/dL 19   43   Creatinine 0.45 - 1.00 mg/dL 4.09  8.11  9.14   Sodium 135 - 145 mmol/L 136   135   Potassium 3.5 - 5.1 mmol/L 4.2   3.7   Chloride 98 - 111 mmol/L 103   96   CO2 22 - 32 mmol/L 25   28   Calcium 8.9 - 10.3 mg/dL 78.2   9.7   Total Protein 6.5 - 8.1 g/dL 7.5   7.0   Total Bilirubin  0.3 - 1.2 mg/dL 0.5   0.7   Alkaline Phos 38 - 126 U/L 51   63   AST 15 - 41 U/L 23   23   ALT 0 - 44 U/L 16   12     Lab Results  Component Value Date   WBC 5.7 12/30/2018   HGB 12.9 12/30/2018   HCT 41.3 12/30/2018   MCV 86.2 12/30/2018   PLT 218 12/30/2018   NEUTROABS 3.8 12/30/2018    ASSESSMENT & PLAN:  Malignant neoplasm of upper-inner quadrant of right breast in female, estrogen receptor positive (HCC) 01/06/2019:Right lumpectomy: IDC, grade 1, 0.8 cm, margins negative, negative  for LV I or PNI, ER 95%, PR 90%, HER-2 negative, Ki-67 5% T1BN0 stage Ia Given the favorable prognostic features and her age, radiation was omitted Current treatment: Anastrozole started 02/20/2019   Anastrozole toxicities: Denies any adverse effects to anastrozole.   Breast cancer surveillance: 1.  Mammogram 11/11/2023: Calcifications right breast, diagnostic right breast mammogram 11/24/2023: 0.8 cm group of calcifications, stereotactic biopsy recommended 2.  Breast exam 11/25/2023: Benign 3.  Bone density 09/02/2022: T score -0.1: Normal   RTC in 1 year ------------------------------------- Assessment and Plan    Breast Calcifications Cluster of calcifications noted on recent mammogram, separate from previous surgery and biopsy sites. Biopsy scheduled for 11/29/2023. Discussed the nature of calcifications and the potential for benign or malignant findings. -Await biopsy results. -Plan to discuss results and potential implications for ongoing Anastrozole therapy in a week after biopsy.  History of Estrogen Receptor Positive Breast Cancer On Anastrozole for the past five years. Plan to discontinue was discussed, pending biopsy results. -Continue Anastrozole until biopsy results are reviewed.  Anticoagulation On Plavix for history of mini-stroke. Upcoming breast biopsy procedure. -Hold Plavix for three days prior to biopsy. -Resume Plavix after the procedure.  Diabetes On Metformin  extended-release 500mg  once daily. -Continue current regimen.  Follow-up Plan for a phone call to discuss biopsy results and potential changes to Anastrozole therapy. -Schedule a phone call for a week after biopsy. -Schedule a one-year follow-up appointment.          No orders of the defined types were placed in this encounter.  The patient has a good understanding of the overall plan. she agrees with it. she will call with any problems that may develop before the next visit here. Total time spent: 30 mins including face to face time and time spent for planning, charting and co-ordination of care   Tamsen Meek, MD 11/25/23

## 2023-11-29 ENCOUNTER — Ambulatory Visit
Admission: RE | Admit: 2023-11-29 | Discharge: 2023-11-29 | Disposition: A | Discharge status: Home / Self Care | Payer: 59 | Source: Ambulatory Visit | Attending: Hematology and Oncology | Admitting: Hematology and Oncology

## 2023-11-29 DIAGNOSIS — R928 Other abnormal and inconclusive findings on diagnostic imaging of breast: Secondary | ICD-10-CM

## 2023-11-29 HISTORY — PX: BREAST BIOPSY: SHX20

## 2023-11-30 LAB — SURGICAL PATHOLOGY

## 2023-12-01 NOTE — Progress Notes (Signed)
  Subjective:  Patient ID: Carolyn Weber, female    DOB: 11/29/1940,  MRN: [id number]  83 y.o. female presents to clinic with  at risk foot care with history of diabetic neuropathy and painful, elongated thickened toenails x 10 which are symptomatic when wearing enclosed shoe gear. This interferes with his/her daily activities.  She is accompanied by her daughter on today's visit. Chief Complaint  Patient presents with   Diabetes    DFC BS - DONT CHECK IT  A1C - 6.3 LVPCP - 08/09/2023     New problem(s): None   PCP is Serena Croissant, MD.  Allergies  Allergen Reactions   Aspirin     Other reaction(s): stomach upset    Review of Systems: Negative except as noted in the HPI.   Objective:  Carolyn Weber is a pleasant 83 y.o. female in NAD. AAO x 3.  Vascular Examination: Palpable pedal pulses. CFT immediate b/l. No edema. No pain with calf compression b/l. Skin temperature gradient WNL b/l. No ischemia or gangrene noted b/l LE. No cyanosis or clubbing noted b/l LE.  Neurological Examination: Sensation grossly intact b/l with 10 gram monofilament. Vibratory sensation intact b/l.   Dermatological Examination: Pedal skin with normal turgor, texture and tone b/l. Toenails 1-5 b/l thick, discolored, elongated with subungual debris and pain on dorsal palpation.  No corns, calluses nor porokeratotic lesions noted.  Musculoskeletal Examination: Muscle strength 5/5 to b/l LE. HAV with bunion deformity noted b/l LE. Hammertoe deformity noted 2-5 b/l.  Radiographs: None  Last A1c:       No data to display           Assessment:   1. Pain due to onychomycosis of toenails of both feet   2. Diabetic peripheral neuropathy associated with type 2 diabetes mellitus (HCC)    Plan:  -Patient's family member present. All questions/concerns addressed on today's visit. -Continue foot and shoe inspections daily. Monitor blood glucose per PCP/Endocrinologist's recommendations. -Patient to  continue soft, supportive shoe gear daily. -Mycotic toenails 1-5 bilaterally were debrided in length and girth with sterile nail nippers and dremel without incident. -Patient/POA to call should there be question/concern in the interim.  Return in about 3 months (around 02/22/2024).  Freddie Breech, DPM      [place] LOCATION: [address] [place] [address]                   Office [phone]   [facility] LOCATION: [address] [place], [place] [address] Office [phone]

## 2023-12-04 HISTORY — PX: BREAST LUMPECTOMY: SHX2

## 2023-12-06 ENCOUNTER — Inpatient Hospital Stay: Payer: 59 | Attending: Hematology and Oncology | Admitting: Hematology and Oncology

## 2023-12-06 DIAGNOSIS — Z170 Estrogen receptor positive status [ER+]: Secondary | ICD-10-CM | POA: Diagnosis not present

## 2023-12-06 DIAGNOSIS — Z79811 Long term (current) use of aromatase inhibitors: Secondary | ICD-10-CM | POA: Insufficient documentation

## 2023-12-06 DIAGNOSIS — C50211 Malignant neoplasm of upper-inner quadrant of right female breast: Secondary | ICD-10-CM | POA: Diagnosis not present

## 2023-12-06 NOTE — Progress Notes (Signed)
HEMATOLOGY-ONCOLOGY TELEPHONE VISIT PROGRESS NOTE  I connected with our patient on 12/06/23 at  1:45 PM EST by telephone and verified that I am speaking with the correct person using two identifiers.  I discussed the limitations, risks, security and privacy concerns of performing an evaluation and management service by telephone and the availability of in person appointments.  I also discussed with the patient that there may be a patient responsible charge related to this service. The patient expressed understanding and agreed to proceed.   History of Present Illness: Follow-up of recent diagnosis of DCIS  History of Present Illness   The patient is a 83 year old female who presents for follow-up regarding a new diagnosis of ductal carcinoma in situ (DCIS).  She has a history of stage one breast cancer diagnosed in 2020, for which she underwent surgery. Radiation therapy was not pursued at that time due to favorable outcomes.  Recently, she has been diagnosed with ductal carcinoma in situ (DCIS), a non-invasive breast cancer, which requires surgical intervention.        Oncology History  Malignant neoplasm of upper-inner quadrant of right breast in female, estrogen receptor positive (HCC)  09/21/2018 Initial Diagnosis   Screening mammogram detected right breast mass 2:30 position.  3 cm from nipple 0.9 x 0.6 x 0.8 cm, ultrasound axilla negative biopsy revealed grade 1-2 IDC ER 95%, PR 90%, HER-2 1+ negative, Ki-67 5%, T1b N0 stage Ia clinical stage   10/10/2018 Cancer Staging   Staging form: Breast, AJCC 8th Edition - Clinical: Stage IA (cT1b, cN0, cM0, G2, ER+, PR+, HER2-) - Signed by Serena Croissant, MD on 10/10/2018   10/28/2018 Genetic Testing   Genetic testing showed no pathogenic mutations. 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  Genes tested include: AIP, ALK, APC, ATM, AXIN2,BAP1,  BARD1, BLM, BMPR1A, BRCA1, BRCA2, BRIP1, CASR, CDC73, CDH1, CDK4,  CDKN1B, CDKN1C, CDKN2A (p14ARF), CDKN2A (p16INK4a), CEBPA, CHEK2, CTNNA1, DICER1, DIS3L2, EGFR (c.2369C>T, p.Thr790Met variant only), EPCAM (Deletion/duplication testing only), FH, FLCN, GATA2, GPC3, GREM1 (Promoter region deletion/duplication testing only), HOXB13 (c.251G>A, p.Gly84Glu), HRAS, KIT, MAX, MEN1, MET, MITF (c.952G>A, p.Glu318Lys variant only), MLH1, MSH2, MSH3, MSH6, MUTYH, NBN, NF1, NF2, NTHL1, PALB2, PDGFRA, PHOX2B, PMS2, POLD1, POLE, POT1, PRKAR1A, PTCH1, PTEN, RAD50, RAD51C, RAD51D, RB1, RECQL4, RET, RUNX1, SDHAF2, SDHA (sequence changes only), SDHB, SDHC, SDHD, SMAD4, SMARCA4, SMARCB1, SMARCE1, STK11, SUFU, TERC, TERT, TMEM127, TP53, TSC1, TSC2, VHL, WRN and WT1.      01/06/2019 Surgery   Right lumpectomy: IDC, grade 1, 0.8 cm, margins negative, negative for LV I or PNI, ER 95%, PR 90%, HER-2 negative, Ki-67 5% T1BN0 stage Ia   01/06/2019 Cancer Staging   Staging form: Breast, AJCC 8th Edition - Pathologic stage from 01/06/2019: Stage IA (pT1b, pN0, cM0, G1, ER+, PR+, HER2-) - Signed by Loa Socks, NP on 01/25/2019   02/2019 -  Anti-estrogen oral therapy   Anastrozole daily     REVIEW OF SYSTEMS:   Constitutional: Denies fevers, chills or abnormal weight loss All other systems were reviewed with the patient and are negative. Observations/Objective:     Assessment Plan:  Malignant neoplasm of upper-inner quadrant of right breast in female, estrogen receptor positive (HCC) 01/06/2019:Right lumpectomy: IDC, grade 1, 0.8 cm, margins negative, negative for LV I or PNI, ER 95%, PR 90%, HER-2 negative, Ki-67 5% T1BN0 stage Ia Given the favorable prognostic features and her age, radiation was omitted Current treatment: Anastrozole started 02/20/2019   Anastrozole  toxicities: Denies any adverse effects to anastrozole.   Breast cancer surveillance: 1.  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  Surgery will need to be performed.  She might need adjuvant radiation as well because of it being ipsilateral recurrence.     I discussed the assessment and treatment plan with the patient. The patient was provided an opportunity to ask questions and all were answered. The patient agreed with the plan and demonstrated an understanding of the instructions. The patient was advised to call back or seek an in-person evaluation if the symptoms worsen or if the condition fails to improve as anticipated.   I provided 20 minutes of non-face-to-face time during this encounter.  This includes time for charting and coordination of care   Tamsen Meek, MD

## 2023-12-06 NOTE — Assessment & Plan Note (Signed)
01/06/2019:Right lumpectomy: IDC, grade 1, 0.8 cm, margins negative, negative for LV I or PNI, ER 95%, PR 90%, HER-2 negative, Ki-67 5% T1BN0 stage Ia Given the favorable prognostic features and her age, radiation was omitted Current treatment: Anastrozole started 02/20/2019   Anastrozole toxicities: Denies any adverse effects to anastrozole.   Breast cancer surveillance: 1.  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  Surgery will need to be performed.  She might need adjuvant radiation as well because of it being ipsilateral recurrence.

## 2023-12-10 ENCOUNTER — Other Ambulatory Visit: Payer: Self-pay | Admitting: General Surgery

## 2023-12-10 DIAGNOSIS — Z170 Estrogen receptor positive status [ER+]: Secondary | ICD-10-CM

## 2023-12-13 ENCOUNTER — Encounter: Payer: Self-pay | Admitting: *Deleted

## 2023-12-13 DIAGNOSIS — D0511 Intraductal carcinoma in situ of right breast: Secondary | ICD-10-CM | POA: Insufficient documentation

## 2023-12-14 ENCOUNTER — Encounter (HOSPITAL_BASED_OUTPATIENT_CLINIC_OR_DEPARTMENT_OTHER): Payer: Self-pay | Admitting: General Surgery

## 2023-12-15 ENCOUNTER — Telehealth: Payer: Self-pay | Admitting: Genetic Counselor

## 2023-12-15 NOTE — Telephone Encounter (Signed)
Called the patient to schedule her for genetic counseling. Patient requested we call her back tomorrow morning.

## 2023-12-16 ENCOUNTER — Encounter (HOSPITAL_BASED_OUTPATIENT_CLINIC_OR_DEPARTMENT_OTHER)
Admission: RE | Admit: 2023-12-16 | Discharge: 2023-12-16 | Disposition: A | Discharge status: Home / Self Care | Payer: 59 | Source: Ambulatory Visit | Attending: General Surgery | Admitting: General Surgery

## 2023-12-16 ENCOUNTER — Telehealth: Payer: Self-pay | Admitting: Genetic Counselor

## 2023-12-16 ENCOUNTER — Other Ambulatory Visit: Payer: Self-pay

## 2023-12-16 DIAGNOSIS — C50211 Malignant neoplasm of upper-inner quadrant of right female breast: Secondary | ICD-10-CM | POA: Diagnosis present

## 2023-12-16 DIAGNOSIS — Z170 Estrogen receptor positive status [ER+]: Secondary | ICD-10-CM | POA: Diagnosis not present

## 2023-12-16 DIAGNOSIS — Z79811 Long term (current) use of aromatase inhibitors: Secondary | ICD-10-CM | POA: Diagnosis not present

## 2023-12-16 DIAGNOSIS — Z01812 Encounter for preprocedural laboratory examination: Secondary | ICD-10-CM | POA: Diagnosis present

## 2023-12-16 LAB — BASIC METABOLIC PANEL
Anion gap: 10 (ref 5–15)
BUN: 26 mg/dL — ABNORMAL HIGH (ref 8–23)
CO2: 25 mmol/L (ref 22–32)
Calcium: 9.6 mg/dL (ref 8.9–10.3)
Chloride: 107 mmol/L (ref 98–111)
Creatinine, Ser: 1.23 mg/dL — ABNORMAL HIGH (ref 0.44–1.00)
GFR, Estimated: 44 mL/min — ABNORMAL LOW (ref >60)
Glucose, Bld: 155 mg/dL — ABNORMAL HIGH (ref 70–99)
Potassium: 3.8 mmol/L (ref 3.5–5.1)
Sodium: 142 mmol/L (ref 135–145)

## 2023-12-16 MED ORDER — CHLORHEXIDINE GLUCONATE CLOTH 2 % EX PADS
6.0000 | MEDICATED_PAD | Freq: Once | CUTANEOUS | Status: DC
Start: 2023-12-16 — End: 2023-12-22

## 2023-12-16 MED ORDER — CHLORHEXIDINE GLUCONATE CLOTH 2 % EX PADS: 6.0000 | MEDICATED_PAD | Freq: Once | CUTANEOUS | Status: DC

## 2023-12-16 NOTE — Progress Notes (Signed)
 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 Pamelia Hoit 11/25/2023   Past/Anticipated interventions by surgeon, if any: Dr. Donell Beers 12/22/2023 Right Breast Seed Localized Lumpectomy  Past/Anticipated interventions by medical oncology, if any:  Dr. Pamelia Hoit on 12/06/2023 Adjuvant Radiation  Lymphedema issues, if any:  Patient denies any swelling.   Pain issues, if any: Patient denies any pain.  SAFETY ISSUES: Prior radiation? None Pacemaker/ICD? None Possible current pregnancy? N/A Is the patient on methotrexate? None  Current Complaints / other details:   None

## 2023-12-16 NOTE — Telephone Encounter (Signed)
Scheduled appointments per 2/13 scheduling message. Patient is aware of the appointments made and will be mailed an appointment reminder.

## 2023-12-16 NOTE — Progress Notes (Signed)
Surgical soap and ERAS drink given to patient, instructions given, patient verbalized understanding.

## 2023-12-21 ENCOUNTER — Ambulatory Visit
Admission: RE | Admit: 2023-12-21 | Discharge: 2023-12-21 | Disposition: A | Discharge status: Home / Self Care | Payer: 59 | Source: Ambulatory Visit | Attending: General Surgery | Admitting: General Surgery

## 2023-12-21 DIAGNOSIS — Z170 Estrogen receptor positive status [ER+]: Secondary | ICD-10-CM

## 2023-12-21 HISTORY — PX: BREAST BIOPSY: SHX20

## 2023-12-21 NOTE — Anesthesia Preprocedure Evaluation (Signed)
 Anesthesia Evaluation  Patient identified by MRN, date of birth, ID band Patient awake    Reviewed: Allergy & Precautions, NPO status , Patient's Chart, lab work & pertinent test results  Airway Mallampati: II       Dental no notable dental hx.    Pulmonary former smoker   Pulmonary exam normal        Cardiovascular hypertension, Pt. on medications Normal cardiovascular exam     Neuro/Psych TIA negative psych ROS   GI/Hepatic negative GI ROS, Neg liver ROS,,,  Endo/Other  diabetes, Oral Hypoglycemic Agents    Renal/GU Renal disease     Musculoskeletal  (+) Arthritis ,    Abdominal   Peds  Hematology  (+) Blood dyscrasia (Plavix)   Anesthesia Other Findings RIGHT BREAST CANCER  Reproductive/Obstetrics                             Anesthesia Physical Anesthesia Plan  ASA: 3  Anesthesia Plan: General   Post-op Pain Management:    Induction: Intravenous  PONV Risk Score and Plan: 3 and Ondansetron, Dexamethasone and Treatment may vary due to age or medical condition  Airway Management Planned: LMA  Additional Equipment:   Intra-op Plan:   Post-operative Plan: Extubation in OR  Informed Consent: I have reviewed the patients History and Physical, chart, labs and discussed the procedure including the risks, benefits and alternatives for the proposed anesthesia with the patient or authorized representative who has indicated his/her understanding and acceptance.     Dental advisory given  Plan Discussed with: CRNA  Anesthesia Plan Comments:        Anesthesia Quick Evaluation

## 2023-12-21 NOTE — H&P (Signed)
 REFERRING PHYSICIAN:  Frederico Hamman, MD     PROVIDER:  Matthias Hughs, MD   Care Team: Patient Care Team: Renford Dills, MD as PCP - General (Internal Medicine) Sabas Sous, MD (Hematology and Oncology) Matthias Hughs, MD as Consulting Provider (Surgical Oncology)    MRN: [id number] DOB: 08/07/1941 DATE OF ENCOUNTER: 12/10/2023   Subjective    Chief Complaint: New Consultation (- Rt breast cancer )       History of Present Illness: Carolyn Weber is a 83 y.o. female who is seen today as an office consultation at the request of Dr. Pamelia Hoit for evaluation of New Consultation (- Rt breast cancer ) .   Patient has a new diagnosis of a second versus recurrent right breast cancer January 2025.  Patient presented with screening detected right breast calcifications.  There is an 8 mm abnormality at 9:00 on the right.  Left breast was benign.  Patient underwent core needle biopsy which demonstrated intermediate grade DCIS with necrosis.  This was weakly ER positive and PR negative.   Patient does have a significant family history of cancer with her mother, brother, 2 aunts, and 5 cousins having history of breast cancer. Genetic testing was negative 10/2018.    Of note, patient had history of right breast cancer.  She had a lumpectomy in 2020 and had 8 mm tumor that was grade 1 with negative margins and was strongly ER/PR positive, HER2 negative with Ki-67 of 5%.  This is a pT1b N0, stage Ia.  Because of her age she did not receive radiation, but did start on anastrozole which she has been on continuously since 2020.   History of Present Illness The patient, with a history of breast cancer, presents with a new diagnosis of noninvasive breast cancer. She reports no pain or discomfort. The patient is also experiencing bowel issues, including loose stools and occasional incontinence. She is unsure if these symptoms are related to her antihormone pill. The patient has a family  history of various cancers, including breast, prostate, bladder, and pancreatic cancer. The patient is currently on Plavix for a possible previous mini stroke, metformin for diabetes, and an anastrozole. She expresses a preference for lumpectomy with radiation as a treatment option for her current breast cancer diagnosis.     Family cancer history - see above.     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       Diagnostic mammogram:11/24/2023 ACR Breast Density Category c: The breasts are heterogeneously dense, which may obscure small masses.  FINDINGS: Spot magnification CC and mediolateral views of the calcifications and a full field mediolateral view were obtained.  There is a group of fine pleomorphic calcifications involving the outer breast at middle depth spanning approximately 0.8 x 0.6 x 0.3 cm, including linear forms. This group of calcifications is well lateral to the malignant lumpectomy site in the upper inner quadrant and is posterior to the scar from the prior excisional biopsy.  IMPRESSION: Indeterminate 0.8 cm group of calcifications involving the outer RIGHT breast at middle depth, near 9 o'clock location.  RECOMMENDATION: Stereotactic tomosynthesis core needle biopsy of the RIGHT breast calcifications.      Pathology core needle biopsy: 11/29/23 1. Breast, right, needle core biopsy, Calcifications lateral right breast (x clip) :  HIGH-GRADE DUCTAL CARCINOMA IN SITU, SOLID TYPE WITH APOCRINE FEATURES AND       NECROSIS       NEGATIVE FOR INVASIVE CARCINOMA       MICROCALCIFICATIONS PRESENT WITHIN DCIS       DCIS MEASURES 3.5 MM IN GREATEST LINEAR EXTENT       FIBROCYSTIC CHANGES INCLUDING STROMAL FIBROSIS, CYSTIC DILATATION OF DUCTS AND       ADENOSIS       FOCAL DUCT ECTASIA     Receptors: Estrogen Receptor:  50%, POSITIVE, WEAK STAINING INTENSITY  Progesterone Receptor:  0%, NEGATIVE      Review of Systems: A complete review of systems was obtained from the patient.  I have reviewed this information and discussed as appropriate with the patient.  See HPI as well for other ROS.   ROS - hearing loss and leg swelling.  Occasional diarrhea as above.         Medical History: Past Medical History      Past Medical History:  Diagnosis Date   Chronic kidney disease     Diabetes mellitus without complication (CMS/HHS-HCC)     History of cancer     History of stroke     Hyperlipidemia     Hypertension          Problem List     Patient Active Problem List  Diagnosis   Malignant neoplasm of upper-outer quadrant of right breast in female, estrogen receptor positive (CMS/HHS-HCC)   History of right breast cancer   Family history of cancer   History of diarrhea        Past Surgical History       Past Surgical History:  Procedure Laterality Date   Bladder sling       MASTECTOMY PARTIAL / LUMPECTOMY            Allergies      Allergies  Allergen Reactions   Asprin Ec Low Dose [Aspirin] Unknown        Medications Ordered Prior to Encounter        Current Outpatient Medications on File Prior to Visit  Medication Sig Dispense Refill   acetaminophen (TYLENOL) 650 MG ER tablet Take 650 mg by mouth every 8 (eight) hours as needed       amLODIPine (NORVASC) 10 MG tablet Take 1 tablet by mouth once daily       anastrozole (ARIMIDEX) 1 mg tablet 1 tablet       ascorbic acid, vitamin C, (VITAMIN C) 500 mg Chew 1 tablet       ascorbic acid/elderberry fruit (AIRBORNE, ELDERBERRY, ORAL) Take by mouth       atorvastatin (LIPITOR) 40 MG tablet Take 1 tablet by mouth every evening       besifloxacin 0.6 % Apply 1 drop to eye       calcium carbonate 600 mg calcium (1,500 mg) Tab tablet Take 600 mg by mouth       cetirizine (ZYRTEC) 10 MG tablet 1 tablet        cholecalciferol (VITAMIN D3) 1000 unit tablet Take 1,000 Units by mouth       clopidogreL (PLAVIX) 75 mg tablet Take 1 tablet by mouth once daily       GEMTESA 75 mg Tab Take 1 tablet by mouth once daily       metFORMIN (GLUCOPHAGE-XR) 500 MG XR tablet Take 500 mg by mouth daily with breakfast       multivitamin tablet Take  0.5 tablets by mouth 2 (two) times daily        No current facility-administered medications on file prior to visit.        Family History       Family History  Problem Relation Age of Onset   Breast cancer Mother     Hyperlipidemia (Elevated cholesterol) Mother     High blood pressure (Hypertension) Mother     Obesity Mother     Coronary Artery Disease (Blocked arteries around heart) Father     Diabetes Father     Hyperlipidemia (Elevated cholesterol) Father     High blood pressure (Hypertension) Father     Breast cancer Brother          Tobacco Use History  Social History        Tobacco Use  Smoking Status Former   Types: Cigarettes  Smokeless Tobacco Never        Social History  Social History         Socioeconomic History   Marital status: Unknown  Tobacco Use   Smoking status: Former      Types: Cigarettes   Smokeless tobacco: Never  Vaping Use   Vaping status: Never Used  Substance and Sexual Activity   Alcohol use: Not Currently   Drug use: Never    Social Drivers of Health        Transportation Needs: No Transportation Needs (10/19/2018)    Received from [facility] - Transportation     Lack of Transportation (Medical): No     Lack of Transportation (Non-Medical): No        Objective:         Vitals:    12/10/23 1126  BP: 129/78  Pulse: 89  Temp: 36.2 C (97.2 F)  SpO2: 99%  Weight: 86.9 kg (191 lb 9.6 oz)  Height: 162.6 cm (5\' 4" )  PainSc: 0-No pain    Body mass index is 32.89 kg/m.   Gen:  No acute distress.  Well nourished and well groomed.   Neurological: Alert and oriented to person,  place, and time. Coordination normal.  Head: Normocephalic and atraumatic.  Eyes: Conjunctivae are normal. Pupils are equal, round, and reactive to light. No scleral icterus.  Neck: Normal range of motion. Neck supple. No tracheal deviation or thyromegaly present.  Cardiovascular: Normal rate, regular rhythm, normal heart sounds and intact distal pulses.  Exam reveals no gallop and no friction rub.  No murmur heard. Breast: Right breast.  Slightly smaller.  Ptotic bilaterally.  No palpable masses in either breast.  No lymphadenopathy.  No nipple retraction or nipple discharge.  No skin dimpling. Respiratory: Effort normal.  No respiratory distress. No chest wall tenderness. Breath sounds normal.  No wheezes, rales or rhonchi.  GI: Soft. Bowel sounds are normal. The abdomen is soft and nontender.  There is no rebound and no guarding.  Musculoskeletal: Normal range of motion. Extremities are nontender.  Lymphadenopathy: No cervical, preauricular, postauricular or axillary adenopathy is present Skin: Skin is warm and dry. No rash noted. No diaphoresis. No erythema. No pallor. No clubbing, cyanosis, or edema.   Psychiatric: Normal mood and affect. Behavior is normal. Judgment and thought content normal.      Labs None recent   Assessment and Plan:        ICD-10-CM    1. Malignant neoplasm of upper-outer quadrant of right breast in female, estrogen receptor positive (CMS/HHS-HCC)  C50.411 Ambulatory Referral to Cancer Genetics  Z17.0 Ambulatory Referral to Radiation Oncology     2. History of right breast cancer  Z85.3       3. Family history of cancer  Z80.9       4. History of diarrhea  Z87.898         Assessment & Plan Breast Cancer, stage 0 Noninvasive, weakly ER positive, PR negative, HER2 negative. Discussed treatment options including lumpectomy with radiation or mastectomy. Patient prefers lumpectomy. Discussed the need for radiation due to weak hormone positivity of the  tumor. -Plan for lumpectomy with subsequent radiation therapy. -Check final pathology for size and margins post-surgery.   The patient will continue her antihormonal treatment for the original cancer, but likely would not necessarily take it for the second cancer.   Family history of cancer -Patient and daughter do desire referral again for genetic testing consideration.  She has had several additional family members with cancer since the previous genetic testing.  I encouraged the patient to try to find any of the living relatives with cancer to see if they had had any genetic testing themselves.  If they had, I have recommended getting a copy of the report to see what company they were.  Given such a strong family cancer history, it would be quite useful to have anyone with cancer to be tested at the same lab in order to try to help get more information to delineate what genetic defect runs in this family.   Anticoagulation Patient on Plavix due to a previous mini-stroke. Discussed the need to hold Plavix prior to surgery. -Hold Plavix for about a week prior to surgery.   Gastrointestinal Patient reports bowel irregularities. Unclear if related to Metformin or other factors. -Consider referral to Gastroenterology for further evaluation.  Patient desires to talk to Dr. Nehemiah Settle again at her annual visit before seeking any GI referral.  Apparently she switched to the long-acting metformin and this helped for a short period of time before her diarrhea recurred.   Pain Management Discussed post-operative pain management. -Continue Tylenol Arthritis Strength three times a day post-surgery. -Consider Tramadol for additional pain relief if needed.   Follow-up Plan for post-operative follow-up to discuss pathology results and further treatment plans. -Schedule follow-up appointment post-surgery.

## 2023-12-22 ENCOUNTER — Ambulatory Visit (HOSPITAL_BASED_OUTPATIENT_CLINIC_OR_DEPARTMENT_OTHER): Payer: 59 | Admitting: Anesthesiology

## 2023-12-22 ENCOUNTER — Encounter (HOSPITAL_BASED_OUTPATIENT_CLINIC_OR_DEPARTMENT_OTHER): Payer: Self-pay | Admitting: General Surgery

## 2023-12-22 ENCOUNTER — Encounter (HOSPITAL_BASED_OUTPATIENT_CLINIC_OR_DEPARTMENT_OTHER)
Admission: RE | Disposition: A | Discharge status: Home / Self Care | Payer: Self-pay | Source: Home / Self Care | Attending: General Surgery

## 2023-12-22 ENCOUNTER — Ambulatory Visit
Admission: RE | Admit: 2023-12-22 | Discharge: 2023-12-22 | Disposition: A | Discharge status: Home / Self Care | Payer: 59 | Source: Ambulatory Visit | Attending: General Surgery | Admitting: General Surgery

## 2023-12-22 ENCOUNTER — Ambulatory Visit (HOSPITAL_BASED_OUTPATIENT_CLINIC_OR_DEPARTMENT_OTHER)
Admission: RE | Admit: 2023-12-22 | Discharge: 2023-12-22 | Disposition: A | Discharge status: Home / Self Care | Payer: 59 | Attending: General Surgery | Admitting: General Surgery

## 2023-12-22 DIAGNOSIS — Z01818 Encounter for other preprocedural examination: Secondary | ICD-10-CM

## 2023-12-22 DIAGNOSIS — Z8249 Family history of ischemic heart disease and other diseases of the circulatory system: Secondary | ICD-10-CM | POA: Diagnosis not present

## 2023-12-22 DIAGNOSIS — Z7984 Long term (current) use of oral hypoglycemic drugs: Secondary | ICD-10-CM | POA: Diagnosis not present

## 2023-12-22 DIAGNOSIS — Z79899 Other long term (current) drug therapy: Secondary | ICD-10-CM | POA: Diagnosis not present

## 2023-12-22 DIAGNOSIS — M1990 Unspecified osteoarthritis, unspecified site: Secondary | ICD-10-CM | POA: Insufficient documentation

## 2023-12-22 DIAGNOSIS — Z833 Family history of diabetes mellitus: Secondary | ICD-10-CM | POA: Insufficient documentation

## 2023-12-22 DIAGNOSIS — Z87891 Personal history of nicotine dependence: Secondary | ICD-10-CM | POA: Diagnosis not present

## 2023-12-22 DIAGNOSIS — Z7902 Long term (current) use of antithrombotics/antiplatelets: Secondary | ICD-10-CM | POA: Diagnosis not present

## 2023-12-22 DIAGNOSIS — E119 Type 2 diabetes mellitus without complications: Secondary | ICD-10-CM

## 2023-12-22 DIAGNOSIS — Z79811 Long term (current) use of aromatase inhibitors: Secondary | ICD-10-CM | POA: Diagnosis not present

## 2023-12-22 DIAGNOSIS — C50412 Malignant neoplasm of upper-outer quadrant of left female breast: Secondary | ICD-10-CM

## 2023-12-22 DIAGNOSIS — Z1722 Progesterone receptor negative status: Secondary | ICD-10-CM | POA: Insufficient documentation

## 2023-12-22 DIAGNOSIS — N189 Chronic kidney disease, unspecified: Secondary | ICD-10-CM | POA: Diagnosis not present

## 2023-12-22 DIAGNOSIS — I10 Essential (primary) hypertension: Secondary | ICD-10-CM

## 2023-12-22 DIAGNOSIS — I129 Hypertensive chronic kidney disease with stage 1 through stage 4 chronic kidney disease, or unspecified chronic kidney disease: Secondary | ICD-10-CM | POA: Diagnosis not present

## 2023-12-22 DIAGNOSIS — E1122 Type 2 diabetes mellitus with diabetic chronic kidney disease: Secondary | ICD-10-CM | POA: Insufficient documentation

## 2023-12-22 DIAGNOSIS — Z170 Estrogen receptor positive status [ER+]: Secondary | ICD-10-CM | POA: Diagnosis not present

## 2023-12-22 DIAGNOSIS — C50911 Malignant neoplasm of unspecified site of right female breast: Secondary | ICD-10-CM

## 2023-12-22 DIAGNOSIS — N6021 Fibroadenosis of right breast: Secondary | ICD-10-CM | POA: Diagnosis not present

## 2023-12-22 DIAGNOSIS — Z803 Family history of malignant neoplasm of breast: Secondary | ICD-10-CM | POA: Insufficient documentation

## 2023-12-22 DIAGNOSIS — D0511 Intraductal carcinoma in situ of right breast: Secondary | ICD-10-CM | POA: Diagnosis not present

## 2023-12-22 DIAGNOSIS — C50411 Malignant neoplasm of upper-outer quadrant of right female breast: Secondary | ICD-10-CM | POA: Diagnosis present

## 2023-12-22 HISTORY — PX: BREAST LUMPECTOMY WITH RADIOACTIVE SEED LOCALIZATION: SHX6424

## 2023-12-22 LAB — GLUCOSE, CAPILLARY
Glucose-Capillary: 104 mg/dL — ABNORMAL HIGH (ref 70–99)
Glucose-Capillary: 124 mg/dL — ABNORMAL HIGH (ref 70–99)

## 2023-12-22 SURGERY — BREAST LUMPECTOMY WITH RADIOACTIVE SEED LOCALIZATION
Anesthesia: General | Site: Breast | Laterality: Right

## 2023-12-22 MED ORDER — LACTATED RINGERS IV SOLN: INTRAVENOUS | Status: DC

## 2023-12-22 MED ORDER — LIDOCAINE-EPINEPHRINE (PF) 1 %-1:200000 IJ SOLN
INTRAMUSCULAR | Status: DC | PRN
  Administered 2023-12-22: 60 mL

## 2023-12-22 MED ORDER — DEXAMETHASONE SODIUM PHOSPHATE 4 MG/ML IJ SOLN
INTRAMUSCULAR | Status: DC | PRN
  Administered 2023-12-22: 4 mg via INTRAVENOUS

## 2023-12-22 MED ORDER — 0.9 % SODIUM CHLORIDE (POUR BTL) OPTIME
TOPICAL | Status: DC | PRN
  Administered 2023-12-22: 700 mL

## 2023-12-22 MED ORDER — PROPOFOL 500 MG/50ML IV EMUL
INTRAVENOUS | Status: DC | PRN
  Administered 2023-12-22: 175 ug/kg/min via INTRAVENOUS

## 2023-12-22 MED ORDER — CEFAZOLIN SODIUM-DEXTROSE 2-4 GM/100ML-% IV SOLN
2.0000 g | INTRAVENOUS | Status: AC
  Administered 2023-12-22: 2 g via INTRAVENOUS

## 2023-12-22 MED ORDER — FENTANYL CITRATE (PF) 100 MCG/2ML IJ SOLN: 25.0000 ug | INTRAMUSCULAR | Status: DC | PRN

## 2023-12-22 MED ORDER — CEFAZOLIN SODIUM-DEXTROSE 2-4 GM/100ML-% IV SOLN
INTRAVENOUS | Status: AC
  Filled 2023-12-22: qty 100

## 2023-12-22 MED ORDER — LIDOCAINE-EPINEPHRINE (PF) 1 %-1:200000 IJ SOLN
INTRAMUSCULAR | Status: AC
  Filled 2023-12-22: qty 90

## 2023-12-22 MED ORDER — ACETAMINOPHEN 500 MG PO TABS
1000.0000 mg | ORAL_TABLET | ORAL | Status: AC
  Administered 2023-12-22: 1000 mg via ORAL

## 2023-12-22 MED ORDER — EPHEDRINE SULFATE (PRESSORS) 50 MG/ML IJ SOLN
INTRAMUSCULAR | Status: DC | PRN
  Administered 2023-12-22 (×2): 5 mg via INTRAVENOUS

## 2023-12-22 MED ORDER — PROPOFOL 1000 MG/100ML IV EMUL
INTRAVENOUS | Status: AC
  Filled 2023-12-22: qty 300

## 2023-12-22 MED ORDER — FENTANYL CITRATE (PF) 100 MCG/2ML IJ SOLN
INTRAMUSCULAR | Status: AC
  Filled 2023-12-22: qty 2

## 2023-12-22 MED ORDER — BUPIVACAINE-EPINEPHRINE (PF) 0.5% -1:200000 IJ SOLN
INTRAMUSCULAR | Status: AC
  Filled 2023-12-22: qty 30

## 2023-12-22 MED ORDER — PHENYLEPHRINE HCL (PRESSORS) 10 MG/ML IV SOLN
INTRAVENOUS | Status: DC | PRN
  Administered 2023-12-22: 80 ug via INTRAVENOUS

## 2023-12-22 MED ORDER — ONDANSETRON HCL 4 MG/2ML IJ SOLN: 4.0000 mg | Freq: Once | INTRAMUSCULAR | Status: DC | PRN

## 2023-12-22 MED ORDER — AMISULPRIDE (ANTIEMETIC) 5 MG/2ML IV SOLN: 10.0000 mg | Freq: Once | INTRAVENOUS | Status: DC | PRN

## 2023-12-22 MED ORDER — TRAMADOL HCL 50 MG PO TABS: 50.0000 mg | ORAL_TABLET | Freq: Four times a day (QID) | ORAL | 0 refills | Status: AC | PRN

## 2023-12-22 MED ORDER — ACETAMINOPHEN 500 MG PO TABS
ORAL_TABLET | ORAL | Status: AC
  Filled 2023-12-22: qty 2

## 2023-12-22 MED ORDER — FENTANYL CITRATE (PF) 100 MCG/2ML IJ SOLN
INTRAMUSCULAR | Status: DC | PRN
  Administered 2023-12-22: 25 ug via INTRAVENOUS

## 2023-12-22 MED ORDER — BUPIVACAINE HCL (PF) 0.25 % IJ SOLN
INTRAMUSCULAR | Status: AC
  Filled 2023-12-22: qty 90

## 2023-12-22 MED ORDER — PROPOFOL 10 MG/ML IV BOLUS
INTRAVENOUS | Status: DC | PRN
  Administered 2023-12-22: 200 mg via INTRAVENOUS

## 2023-12-22 MED ORDER — ONDANSETRON HCL 4 MG/2ML IJ SOLN
INTRAMUSCULAR | Status: DC | PRN
  Administered 2023-12-22: 4 mg via INTRAVENOUS

## 2023-12-22 MED ORDER — LIDOCAINE HCL (CARDIAC) PF 100 MG/5ML IV SOSY
PREFILLED_SYRINGE | INTRAVENOUS | Status: DC | PRN
  Administered 2023-12-22: 60 mg via INTRAVENOUS

## 2023-12-22 SURGICAL SUPPLY — 45 items
BINDER BREAST LRG (GAUZE/BANDAGES/DRESSINGS) IMPLANT
BINDER BREAST MEDIUM (GAUZE/BANDAGES/DRESSINGS) IMPLANT
BINDER BREAST XLRG (GAUZE/BANDAGES/DRESSINGS) IMPLANT
BINDER BREAST XXLRG (GAUZE/BANDAGES/DRESSINGS) IMPLANT
BLADE SURG 10 STRL SS (BLADE) ×1 IMPLANT
BLADE SURG 15 STRL LF DISP TIS (BLADE) IMPLANT
CANISTER SUC SOCK COL 7IN (MISCELLANEOUS) IMPLANT
CANISTER SUCT 1200ML W/VALVE (MISCELLANEOUS) IMPLANT
CHLORAPREP W/TINT 26 (MISCELLANEOUS) ×1 IMPLANT
CLIP TI LARGE 6 (CLIP) ×1 IMPLANT
CLIP TI MEDIUM 6 (CLIP) IMPLANT
COVER BACK TABLE 60X90IN (DRAPES) ×1 IMPLANT
COVER MAYO STAND STRL (DRAPES) ×1 IMPLANT
COVER PROBE CYLINDRICAL 5X96 (MISCELLANEOUS) ×1 IMPLANT
DERMABOND ADVANCED .7 DNX12 (GAUZE/BANDAGES/DRESSINGS) ×1 IMPLANT
DRAPE LAPAROSCOPIC ABDOMINAL (DRAPES) ×1 IMPLANT
DRAPE UTILITY XL STRL (DRAPES) ×1 IMPLANT
ELECT COATED BLADE 2.86 ST (ELECTRODE) ×1 IMPLANT
ELECT REM PT RETURN 9FT ADLT (ELECTROSURGICAL) ×1 IMPLANT
ELECTRODE REM PT RTRN 9FT ADLT (ELECTROSURGICAL) ×1 IMPLANT
GAUZE SPONGE 4X4 12PLY STRL LF (GAUZE/BANDAGES/DRESSINGS) ×1 IMPLANT
GLOVE BIO SURGEON STRL SZ 6 (GLOVE) ×1 IMPLANT
GLOVE BIOGEL PI IND STRL 6.5 (GLOVE) ×1 IMPLANT
GOWN STRL REUS W/ TWL LRG LVL3 (GOWN DISPOSABLE) ×1 IMPLANT
GOWN STRL REUS W/ TWL XL LVL3 (GOWN DISPOSABLE) ×1 IMPLANT
KIT MARKER MARGIN INK (KITS) ×1 IMPLANT
LIGHT WAVEGUIDE WIDE FLAT (MISCELLANEOUS) IMPLANT
NDL HYPO 25X1 1.5 SAFETY (NEEDLE) ×1 IMPLANT
NEEDLE HYPO 25X1 1.5 SAFETY (NEEDLE) ×1 IMPLANT
NS IRRIG 1000ML POUR BTL (IV SOLUTION) ×1 IMPLANT
PACK BASIN DAY SURGERY FS (CUSTOM PROCEDURE TRAY) ×1 IMPLANT
PENCIL SMOKE EVACUATOR (MISCELLANEOUS) ×1 IMPLANT
SLEEVE SCD COMPRESS KNEE MED (STOCKING) ×1 IMPLANT
SPIKE FLUID TRANSFER (MISCELLANEOUS) IMPLANT
SPONGE T-LAP 18X18 ~~LOC~~+RFID (SPONGE) ×1 IMPLANT
STRIP CLOSURE SKIN 1/2X4 (GAUZE/BANDAGES/DRESSINGS) ×1 IMPLANT
SUT MNCRL AB 4-0 PS2 18 (SUTURE) ×1 IMPLANT
SUT SILK 2 0 SH (SUTURE) IMPLANT
SUT VIC AB 2-0 SH 27XBRD (SUTURE) ×1 IMPLANT
SUT VIC AB 3-0 SH 27X BRD (SUTURE) ×1 IMPLANT
SYR CONTROL 10ML LL (SYRINGE) ×1 IMPLANT
TOWEL GREEN STERILE FF (TOWEL DISPOSABLE) ×1 IMPLANT
TRAY FAXITRON CT DISP (TRAY / TRAY PROCEDURE) ×1 IMPLANT
TUBE CONNECTING 20X1/4 (TUBING) IMPLANT
YANKAUER SUCT BULB TIP NO VENT (SUCTIONS) IMPLANT

## 2023-12-22 NOTE — Anesthesia Procedure Notes (Signed)
 Procedure Name: LMA Insertion Date/Time: 12/22/2023 9:31 AM  Performed by: Earmon Phoenix, CRNAPre-anesthesia Checklist: Patient identified, Emergency Drugs available, Suction available, Patient being monitored and Timeout performed Patient Re-evaluated:Patient Re-evaluated prior to induction Oxygen Delivery Method: Circle system utilized Preoxygenation: Pre-oxygenation with 100% oxygen Induction Type: IV induction Ventilation: Mask ventilation without difficulty LMA: LMA inserted LMA Size: 4.0 Number of attempts: 1 Placement Confirmation: positive ETCO2 and breath sounds checked- equal and bilateral Tube secured with: Tape Dental Injury: Teeth and Oropharynx as per pre-operative assessment

## 2023-12-22 NOTE — Interval H&P Note (Signed)
 History and Physical Interval Note:  12/22/2023 9:00 AM  Carolyn Weber  has presented today for surgery, with the diagnosis of RIGHT BREAST CANCER.  The various methods of treatment have been discussed with the patient and family. After consideration of risks, benefits and other options for treatment, the patient has consented to  Procedure(s): RIGHT BREAST SEED LOCALIZED LUMPECTOMY (Right) as a surgical intervention.  The patient's history has been reviewed, patient examined, no change in status, stable for surgery.  I have reviewed the patient's chart and labs.  Questions were answered to the patient's satisfaction.     Almond Lint

## 2023-12-22 NOTE — Anesthesia Postprocedure Evaluation (Signed)
 Anesthesia Post Note  Patient: Carolyn Weber  Procedure(s) Performed: RIGHT BREAST SEED LOCALIZED LUMPECTOMY (Right: Breast)     Patient location during evaluation: PACU Anesthesia Type: General Level of consciousness: awake Pain management: pain level controlled Vital Signs Assessment: post-procedure vital signs reviewed and stable Respiratory status: spontaneous breathing, nonlabored ventilation and respiratory function stable Cardiovascular status: blood pressure returned to baseline and stable Postop Assessment: no apparent nausea or vomiting Anesthetic complications: no   No notable events documented.  Last Vitals:  Vitals:   12/22/23 1045 12/22/23 1109  BP: 139/64 (!) 146/67  Pulse: 81 79  Resp: 16 18  Temp:  36.5 C  SpO2: 95% 99%    Last Pain:  Vitals:   12/22/23 1109  TempSrc:   PainSc: 1                  Edger Husain P Analaura Messler

## 2023-12-22 NOTE — Transfer of Care (Signed)
 Immediate Anesthesia Transfer of Care Note  Patient: Carolyn Weber  Procedure(s) Performed: RIGHT BREAST SEED LOCALIZED LUMPECTOMY (Right: Breast)  Patient Location: PACU  Anesthesia Type:General  Level of Consciousness: awake, alert , oriented, and patient cooperative  Airway & Oxygen Therapy: Patient Spontanous Breathing and Patient connected to face mask oxygen  Post-op Assessment: Report given to RN and Post -op Vital signs reviewed and stable  Post vital signs: Reviewed and stable  Last Vitals:  Vitals Value Taken Time  BP 128/56 12/22/23 1035  Temp    Pulse 81 12/22/23 1037  Resp 16 12/22/23 1037  SpO2 100 % 12/22/23 1037  Vitals shown include unfiled device data.  Last Pain:  Vitals:   12/22/23 0736  TempSrc: Temporal  PainSc: 0-No pain      Patients Stated Pain Goal: 3 (12/22/23 0736)  Complications: No notable events documented.

## 2023-12-22 NOTE — Op Note (Signed)
 Right Breast Radioactive seed localized lumpectomy  Indications: This patient presents with history of right breast cancer, cTis, lower outer quadrant, high grade DCIS, +/weak ER and negative PR receptors  Pre-operative Diagnosis: right breast cancer   Post-operative Diagnosis: same  Surgeon: Almond Lint   Assist:  Saunders Glance, PA-C  Anesthesia: General endotracheal anesthesia  ASA Class: 3  Procedure Details  The patient was seen in the Holding Room. The risks, benefits, complications, treatment options, and expected outcomes were discussed with the patient. The possibilities of bleeding, infection, the need for additional procedures, failure to diagnose a condition, and creating a complication requiring other procedures or operations were discussed with the patient. The patient concurred with the proposed plan, giving informed consent.  The site of surgery properly noted/marked. The patient was taken to Operating Room # 1, identified, and the procedure verified as right breast seed localized lumpectomy.  The right breast and chest were prepped and draped in standard fashion. A circumlinear inferolateral incision was made near the previously placed radioactive seed.  Dissection was carried down around the point of maximum signal intensity. The cautery was used to perform the dissection.   The specimen was inked with the margin marker paint kit.    Specimen radiography confirmed inclusion of the mammographic lesion, the clip, and the seed.  The background signal in the breast was zero.  Hemostasis was achieved with cautery.  Additional margins were taken at all 6 cardinal directions.  The cavity was marked with clips on each border other than the anterior border.  The wound was irrigated and closed with 3-0 vicryl interrupted deep dermal sutures and 4-0 monocryl running subcuticular suture.      Sterile dressings were applied. At the end of the operation, all sponge, instrument, and needle  counts were correct.   Findings: Seed, clip in specimen.     Estimated Blood Loss:  min         Specimens: right breast tissue with seed, additional medial margin, additional posterior margin, additional lateral margin, additional inferior, superior, and anterior margins.          Complications:  None; patient tolerated the procedure well.         Disposition: PACU - hemodynamically stable.         Condition: stable

## 2023-12-22 NOTE — Discharge Instructions (Addendum)
 [facility] Office Phone Number [phone]  BREAST BIOPSY/ PARTIAL MASTECTOMY: POST OP INSTRUCTIONS  Always review your discharge instruction sheet given to you by the facility where your surgery was performed.  IF YOU HAVE DISABILITY OR FAMILY LEAVE FORMS, YOU MUST BRING THEM TO THE OFFICE FOR PROCESSING.  DO NOT GIVE THEM TO YOUR DOCTOR.  Take 2 tylenol (acetominophen) three times a day for 3 days.  If you still have pain, add ibuprofen with food in between if able to take this (if you have kidney issues or stomach issues, do not take ibuprofen).  If both of those are not enough, add the narcotic pain pill.  If you find you are needing a lot of this overnight after surgery, call the next morning for a refill.    Prescriptions will not be filled after 5pm or on week-ends. Take your usually prescribed medications unless otherwise directed You should eat very light the first 24 hours after surgery, such as soup, crackers, pudding, etc.  Resume your normal diet the day after surgery. Most patients will experience some swelling and bruising in the breast.  Ice packs and a good support bra will help.  Swelling and bruising can take several days to resolve.  It is common to experience some constipation if taking pain medication after surgery.  Increasing fluid intake and taking a stool softener will usually help or prevent this problem from occurring.  A mild laxative (Milk of Magnesia or Miralax) should be taken according to package directions if there are no bowel movements after 48 hours. Unless discharge instructions indicate otherwise, you may remove your bandages 48 hours after surgery, and you may shower at that time.  You may have steri-strips (small skin tapes) in place directly over the incision.  These strips should be left on the skin at least for for 7-10 days.    ACTIVITIES:  You may resume regular daily activities (gradually increasing) beginning the next day.  Wearing a  good support bra or sports bra (or the breast binder) minimizes pain and swelling.  You may have sexual intercourse when it is comfortable. No heavy lifting for 1-2 weeks (not over around 10 pounds).  You may drive when you no longer are taking prescription pain medication, you can comfortably wear a seatbelt, and you can safely maneuver your car and apply brakes. RETURN TO WORK:  __________3-14 days depending on job. _______________ Carolyn Weber should see your doctor in the office for a follow-up appointment approximately two weeks after your surgery.  Your doctor's nurse will typically make your follow-up appointment when she calls you with your pathology report.  Expect your pathology report 3-4 business days after your surgery.  You may call to check if you do not hear from [place] after three days.   WHEN TO CALL YOUR DOCTOR: Fever over 101.0 Nausea and/or vomiting. Extreme swelling or bruising. Continued bleeding from incision. Increased pain, redness, or drainage from the incision.  The clinic staff is available to answer your questions during regular business hours.  Please don't hesitate to call and ask to speak to one of the nurses for clinical concerns.  If you have a medical emergency, go to the nearest emergency room or call 911.  A surgeon from [facility] is always on call at the hospital.  For further questions, please visit [email or web address]     Post Anesthesia Home Care Instructions  Activity: Get plenty of rest for the remainder of the day. A responsible individual must  stay with you for 24 hours following the procedure.  For the next 24 hours, DO NOT: -Drive a car -Advertising copywriter -Drink alcoholic beverages -Take any medication unless instructed by your physician -Make any legal decisions or sign important papers.  Meals: Start with liquid foods such as gelatin or soup. Progress to regular foods as tolerated. Avoid greasy, spicy, heavy foods. If nausea  and/or vomiting occur, drink only clear liquids until the nausea and/or vomiting subsides. Call your physician if vomiting continues.  Special Instructions/Symptoms: Your throat may feel dry or sore from the anesthesia or the breathing tube placed in your throat during surgery. If this causes discomfort, gargle with warm salt water. The discomfort should disappear within 24 hours.  If you had a scopolamine patch placed behind your ear for the management of post- operative nausea and/or vomiting:  1. The medication in the patch is effective for 72 hours, after which it should be removed.  Wrap patch in a tissue and discard in the trash. Wash hands thoroughly with soap and water. 2. You may remove the patch earlier than 72 hours if you experience unpleasant side effects which may include dry mouth, dizziness or visual disturbances. 3. Avoid touching the patch. Wash your hands with soap and water after contact with the patch.     Last received tylenol at 0740am

## 2023-12-23 ENCOUNTER — Telehealth: Payer: Self-pay | Admitting: Radiation Oncology

## 2023-12-23 ENCOUNTER — Encounter (HOSPITAL_BASED_OUTPATIENT_CLINIC_OR_DEPARTMENT_OTHER): Payer: Self-pay | Admitting: General Surgery

## 2023-12-23 LAB — SURGICAL PATHOLOGY

## 2023-12-23 NOTE — Telephone Encounter (Signed)
 Left message for patient's daughter to call back to reschedule upcoming appointment per 2/20 inbasket message.

## 2023-12-23 NOTE — Progress Notes (Incomplete)
 Radiation Oncology         [phone] ________________________________  Initial Outpatient Consultation  Name: Carolyn Weber MRN: [id number]  Date: 12/24/2023  DOB: [birth year]  WU:[id number], Windy Fast, MD  Serena Croissant, MD   REFERRING PHYSICIAN: Serena Croissant, MD  DIAGNOSIS: No diagnosis found.   Cancer Staging  Malignant neoplasm of upper-inner quadrant of right breast in female, estrogen receptor positive (HCC) Staging form: Breast, AJCC 8th Edition - Clinical: Stage IA (cT1b, cN0, cM0, G2, ER+, PR+, HER2-) - Signed by Serena Croissant, MD on 10/10/2018 Neoadjuvant therapy: No Histologic grading system: 3 grade system Laterality: Right Tumor size (mm): 9 - Pathologic stage from 01/06/2019: Stage IA (pT1b, pN0, cM0, G1, ER+, PR+, HER2-) - Signed by Loa Socks, NP on 01/25/2019 Neoadjuvant therapy: No Histologic grading system: 3 grade system   Stage 0 (cTis (DCIS), cN0, cM0) Right Breast, High-grade DCIS, 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 female who presented with a right breast abnormality on the following imaging: bilateral screening mammogram on the date of 11/10/23. No symptoms, if any, were reported at that time. Right breast diagnostic mammogram on 11/24/23 further revealed an indeterminate 0.8 cm group of calcifications involving the outer right breast (9 o'clock region) at a middle depth. No abnormal right axillary lymph nodes were demonstrated, however this is in the absence of sonographic evaluation.   Biopsy if the right breast calcifications on date of 11/29/23 showed high-grade DCIS  measuring 3.5 mm in the greatest linear extent of the sample, along with necrosis and apocrine features (negative for IDC).  ER status: 50% positive with weak staining intensity; PR status 0% negative, Her2 status not assessed. (No lymph nodes were examined).   She was accordingly referred to Dr. Donell Beers and opted to proceed with a right breast lumpectomy without nodal biopsies on 12/22/23. Pathology from the procedure revealed: tumor the size of 3.6 mm; histology of high grade DCIS with focal necrosis; all final margins negative for DCIS; final margin status to in situ disease of 0.8 mm from the anterior margin. ER status: 50% positive with weak staining intensity; PR status 0% negative, Her2 status not assessed.   As noted above, the patient has a prior history of right breast cancer (grade 1 IDC) diagnosed in 2019. She underwent a lumpectomy and began antiestrogen therapy in early 2020 under Dr. Pamelia Hoit. I actually met with her to discuss radiation therapy in December of 2019, and based on her favorable prognostics, she opted to omit radiation therapy at that time.   She was recently seen by Dr. Pamelia Hoit on 12/06/23 (prior to her surgery) and she will continue on her current antiestrogen treatment regimen consisting of anastrozole.    ***  PREVIOUS RADIATION THERAPY: No  PAST MEDICAL HISTORY:  has a past medical history of Allergy, Arthritis, Cancer (HCC) (10/2018), Chronic kidney disease, Colon polyps, Complication of anesthesia, Diabetic retinopathy (HCC), Difficulty in urination, Diverticulitis, Family history of breast cancer, Family history of lung cancer, Family history of prostate cancer, Hyperlipemia, Hypertension, Obesity, and TIA (transient ischemic attack).    PAST SURGICAL HISTORY: Past Surgical History:  Procedure Laterality  Date   BREAST BIOPSY Right 11/29/2023   MM RT BREAST BX W LOC DEV 1ST LESION IMAGE BX SPEC STEREO GUIDE 11/29/2023 [facility]   BREAST BIOPSY  12/21/2023    MM RT RADIOACTIVE SEED LOC MAMMO GUIDE 12/21/2023 [facility]   BREAST EXCISIONAL BIOPSY Right    about 30 years ago.    BREAST LUMPECTOMY Right 01/2019   BREAST LUMPECTOMY WITH RADIOACTIVE SEED LOCALIZATION Right 01/06/2019   Procedure: RIGHT BREAST LUMPECTOMY WITH RADIOACTIVE SEED LOCALIZATION;  Surgeon: Claud Kelp, MD;  Location: [facility] OR;  Service: General;  Laterality: Right;   BREAST LUMPECTOMY WITH RADIOACTIVE SEED LOCALIZATION Right 12/22/2023   Procedure: RIGHT BREAST SEED LOCALIZED LUMPECTOMY;  Surgeon: Almond Lint, MD;  Location: [facility];  Service: General;  Laterality: Right;   BREAST SURGERY     breast biospy   calcium removal from breast     COLONOSCOPY     right breast biopsy Right 09/21/2018   urinary sling      FAMILY HISTORY: family history includes Breast cancer in her cousin, cousin, cousin, cousin, cousin, maternal aunt, maternal aunt, and mother; Breast cancer (age of onset: 18) in her brother; Cancer in her mother; Colon polyps in her brother; Diabetes in her father and mother; Heart disease in her father and mother; Hyperlipidemia in her father and mother; Hypertension in her father and mother; Lung cancer in her paternal grandmother; Prostate cancer in her brother, father, and maternal uncle; Stroke in her mother.  SOCIAL HISTORY:  reports that she quit smoking about 45 years ago. Her smoking use included cigarettes. She has never used smokeless tobacco. She reports that she does not drink alcohol and does not use drugs.  ALLERGIES: Aspirin and Other  MEDICATIONS:  Current Outpatient Medications  Medication Sig Dispense Refill   acetaminophen (TYLENOL) 650 MG CR tablet Take 650 mg by mouth every 8 (eight) hours as needed for pain.     amLODipine (NORVASC) 10 MG tablet Take 1 tablet by mouth daily.     anastrozole (ARIMIDEX) 1 MG tablet 1 tablet 90 tablet 3   Ascorbic Acid (VITAMIN C) 500 MG CHEW 1 tablet     atorvastatin (LIPITOR) 40 MG  tablet Take 1 tablet by mouth every evening.     BLACK CURRANT SEED OIL PO Take by mouth.     calcium carbonate (OS-CAL) 600 MG TABS Take 600 mg by mouth 2 (two) times daily with a meal.     cetirizine (ZYRTEC ALLERGY) 10 MG tablet 1 tablet     cholecalciferol (VITAMIN D) 1000 UNITS tablet Take 1,000 Units by mouth 2 (two) times daily.      clopidogrel (PLAVIX) 75 MG tablet Take 1 tablet by mouth daily.     Elderberry-Vitamin C-Zinc (ELDERBERRY EXTRACT PO) Take by mouth.     metFORMIN (GLUCOPHAGE-XR) 500 MG 24 hr tablet Take 1 tablet (500 mg total) by mouth daily with breakfast.     Multiple Vitamin (MULTIVITAMIN) tablet Take 0.5 tablets by mouth 2 (two) times daily.      Polyethyl Glycol-Propyl Glycol (SYSTANE OP) Apply 1 drop to eye every morning.     traMADol (ULTRAM) 50 MG tablet Take 1 tablet (50 mg total) by mouth every 6 (six) hours as needed. 5 tablet 0   Vibegron (GEMTESA) 75 MG TABS Take 1 tablet by mouth daily. 30 tablet    Besifloxacin HCl (BESIVANCE) 0.6 % SUSP Place 1 drop into the right eye See admin instructions. Drop 1 drop in  the right eye the day of and the day after eye injection. (Patient not taking: Reported on 12/23/2023)     clobetasol cream (TEMOVATE) 0.05 % Apply 1 application topically daily as needed (irritation).  (Patient not taking: Reported on 12/23/2023)     No current facility-administered medications for this encounter.    REVIEW OF SYSTEMS: As above in HPI.   PHYSICAL EXAM:  vitals were not taken for this visit.   General: Alert and oriented, in no acute distress HEENT: Head is normocephalic. Extraocular movements are intact. Oropharynx is clear. Neck: Neck is supple, no palpable cervical or supraclavicular lymphadenopathy. Heart: Regular in rate and rhythm with no murmurs, rubs, or gallops. Chest: Clear to auscultation bilaterally, with no rhonchi, wheezes, or rales. Abdomen: Soft, nontender, nondistended, with no rigidity or guarding. Extremities: No  cyanosis or edema. Lymphatics: see Neck Exam Skin: No concerning lesions. Musculoskeletal: symmetric strength and muscle tone throughout. Neurologic: Cranial nerves II through XII are grossly intact. No obvious focalities. Speech is fluent. Coordination is intact. Psychiatric: Judgment and insight are intact. Affect is appropriate. Breasts: *** . No other palpable masses appreciated in the breasts or axillae *** .    ECOG = ***  0 - Asymptomatic (Fully active, able to carry on all predisease activities without restriction)  1 - Symptomatic but completely ambulatory (Restricted in physically strenuous activity but ambulatory and able to carry out work of a light or sedentary nature. For example, light housework, office work)  2 - Symptomatic, <50% in bed during the day (Ambulatory and capable of all self care but unable to carry out any work activities. Up and about more than 50% of waking hours)  3 - Symptomatic, >50% in bed, but not bedbound (Capable of only limited self-care, confined to bed or chair 50% or more of waking hours)  4 - Bedbound (Completely disabled. Cannot carry on any self-care. Totally confined to bed or chair)  5 - Death   Santiago Glad MM, Creech RH, Tormey DC, et al. [phone]). "Toxicity and response criteria of the [employer]". Am. Evlyn Clines. Oncol. 5 (6): 649-55   LABORATORY DATA:  Lab Results  Component Value Date   WBC 5.7 12/30/2018   HGB 12.9 12/30/2018   HCT 41.3 12/30/2018   MCV 86.2 12/30/2018   PLT 218 12/30/2018   CMP     Component Value Date/Time   NA 142 12/16/2023 1430   K 3.8 12/16/2023 1430   CL 107 12/16/2023 1430   CO2 25 12/16/2023 1430   GLUCOSE 155 (H) 12/16/2023 1430   BUN 26 (H) 12/16/2023 1430   CREATININE 1.23 (H) 12/16/2023 1430   CREATININE 2.45 (H) 07/09/2014 1243   CALCIUM 9.6 12/16/2023 1430   PROT 7.5 12/30/2018 1324   ALBUMIN 4.2 12/30/2018 1324   AST 23 12/30/2018 1324   ALT 16 12/30/2018 1324    ALKPHOS 51 12/30/2018 1324   BILITOT 0.5 12/30/2018 1324   GFRNONAA 44 (L) 12/16/2023 1430   GFRAA 55 (L) 12/30/2018 1324         RADIOGRAPHY: MM Breast Surgical Specimen Result Date: 12/22/2023 CLINICAL DATA:  Patient is status post stereotactic guided biopsy RIGHT breast calcifications demonstrating high-grade ductal carcinoma. Patient presented for seed localization of the X shaped biopsy marking clip. EXAM: SPECIMEN RADIOGRAPH OF THE RIGHT BREAST COMPARISON:  Previous exam(s). FINDINGS: Status post excision of the RIGHT breast. The radioactive seed and X shaped biopsy marker clip are present, completely intact. IMPRESSION: Specimen radiograph of the  RIGHT breast. These results were called by telephone at the time of interpretation on 12/22/2023 at 10:07 am to OR staff who verbally acknowledged these results. Electronically Signed   By: Meda Klinefelter M.D.   On: 12/22/2023 10:08   MM RT RADIOACTIVE SEED LOC MAMMO GUIDE Result Date: 12/21/2023 CLINICAL DATA:  83 year old female with newly diagnosed right breast DCIS presenting for seed localization prior to lumpectomy. EXAM: MAMMOGRAPHIC GUIDED RADIOACTIVE SEED LOCALIZATION OF THE RIGHT BREAST COMPARISON:  Previous exam(s). FINDINGS: Patient presents for radioactive seed localization prior to right breast lumpectomy. I met with the patient and we discussed the procedure of seed localization including benefits and alternatives. We discussed the high likelihood of a successful procedure. We discussed the risks of the procedure including infection, bleeding, tissue injury and further surgery. We discussed the low dose of radioactivity involved in the procedure. Informed, written consent was given. The usual time-out protocol was performed immediately prior to the procedure. Using mammographic guidance, sterile technique, 1% lidocaine and an I-125 radioactive seed, the X biopsy marking clip in the right breast was localized using a lateral approach. The  follow-up mammogram images confirm the seed in the expected location and were marked for Dr. Donell Beers. Follow-up survey of the patient confirms presence of the radioactive seed. Order number of I-125 seed:  [id number]. Total activity: 0.236 mCi reference Date: 12/06/2023 The patient tolerated the procedure well and was released from the [facility]. She was given instructions regarding seed removal. IMPRESSION: Radioactive seed localization right breast. No apparent complications. Electronically Signed   By: Emmaline Kluver M.D.   On: 12/21/2023 14:11   MM RT BREAST BX W LOC DEV 1ST LESION IMAGE BX SPEC STEREO GUIDE Addendum Date: 12/01/2023 ADDENDUM REPORT: 12/01/2023 07:31 ADDENDUM: Pathology revealed HIGH-GRADE DUCTAL CARCINOMA IN SITU, SOLID TYPE WITH APOCRINE FEATURES AND NECROSIS, MICROCALCIFICATIONS PRESENT WITHIN DCIS, FIBROCYSTIC CHANGES INCLUDING STROMAL FIBROSIS, CYSTIC DILATATION OF DUCTS AND ADENOSIS, FOCAL DUCT ECTASIA of the RIGHT breast, lateral, (x clip). This was found to be concordant by Dr. Frederico Hamman. Pathology results were discussed with the patient and her daughter, Delcenia Inman by telephone, per request. The patient reported doing well after the biopsy with tenderness and bruising at the site. Post biopsy instructions and care were reviewed and questions were answered. The patient was encouraged to call [facility] for any additional concerns. My direct phone number was provided. Medical oncology consultation has been arranged with Dr. Serena Croissant at [facility], telemedicine visit, on December 06, 2023. Surgical consultation has been arranged with Dr. Almond Lint at [facility] on December 10, 2023. Consideration for a bilateral breast MRI for further evaluation of extent of disease given the high grade histology. Pathology results reported by Rene Kocher, RN on 11/30/2023. Electronically Signed   By: Frederico Hamman M.D.   On:  12/01/2023 07:31   Result Date: 12/01/2023 CLINICAL DATA:  83 year old female presenting for stereotactic biopsy of right breast calcifications. EXAM: RIGHT BREAST STEREOTACTIC CORE NEEDLE BIOPSY COMPARISON:  Previous exam(s). FINDINGS: The patient and I discussed the procedure of stereotactic-guided biopsy including benefits and alternatives. We discussed the high likelihood of a successful procedure. We discussed the risks of the procedure including infection, bleeding, tissue injury, clip migration, and inadequate sampling. Informed written consent was given. The usual time out protocol was performed immediately prior to the procedure. Using sterile technique and 1% Lidocaine as local anesthetic, under stereotactic guidance, a 9 gauge vacuum assisted device was  used to perform core needle biopsy of calcifications in the lateral right breast using a lateral approach. Specimen radiograph was performed showing calcifications primarily within 1 core sample. Specimens with calcifications are identified for pathology. Lesion quadrant: Lower outer quadrant At the conclusion of the procedure, an X shaped tissue marker clip was deployed into the biopsy cavity. Follow-up 2-view mammogram was performed and dictated separately. IMPRESSION: Stereotactic-guided biopsy of calcifications in the lateral right breast (X clip). No apparent complications. Electronically Signed: By: Frederico Hamman M.D. On: 11/29/2023 12:14   MM CLIP PLACEMENT RIGHT Result Date: 11/29/2023 CLINICAL DATA:  Post biopsy mammogram of the right breast for clip placement. EXAM: 3D DIAGNOSTIC RIGHT MAMMOGRAM POST STEREOTACTIC BIOPSY COMPARISON:  Previous exam(s). FINDINGS: 3D Mammographic images were obtained following stereotactic guided biopsy of calcifications in the lateral right breast. The biopsy marking clip is in expected position at the site of biopsy. IMPRESSION: Appropriate positioning of the X shaped biopsy marking clip at the site of  biopsy in the lateral right breast. Final Assessment: Post Procedure Mammograms for Marker Placement Electronically Signed   By: Frederico Hamman M.D.   On: 11/29/2023 12:16   MM Digital Diagnostic Unilat R Result Date: 11/24/2023 CLINICAL DATA:  Recall from screening mammography, calcifications involving the outer RIGHT breast at middle depth, near 9 o'clock location. Personal history of malignant lumpectomy from the UPPER INNER QUADRANT of the RIGHT breast in November, 2019. Remote prior benign excisional biopsy from the upper RIGHT breast. EXAM: DIGITAL DIAGNOSTIC UNILATERAL RIGHT MAMMOGRAM TECHNIQUE: Right digital diagnostic mammography was performed. COMPARISON:  Previous exam(s). ACR Breast Density Category c: The breasts are heterogeneously dense, which may obscure small masses. FINDINGS: Spot magnification CC and mediolateral views of the calcifications and a full field mediolateral view were obtained. There is a group of fine pleomorphic calcifications involving the outer breast at middle depth spanning approximately 0.8 x 0.6 x 0.3 cm, including linear forms. This group of calcifications is well lateral to the malignant lumpectomy site in the upper inner quadrant and is posterior to the scar from the prior excisional biopsy. IMPRESSION: Indeterminate 0.8 cm group of calcifications involving the outer RIGHT breast at middle depth, near 9 o'clock location. RECOMMENDATION: Stereotactic tomosynthesis core needle biopsy of the RIGHT breast calcifications. I have discussed the findings and recommendations with the patient. The stereotactic tomosynthesis core needle biopsy procedure was discussed with the patient and her questions were answered. She wishes to proceed with the biopsy which has been scheduled by the [facility] staff. BI-RADS CATEGORY  4: Suspicious. Electronically Signed   By: Hulan Saas M.D.   On: 11/24/2023 13:34      IMPRESSION/PLAN: ***   It was a  pleasure meeting the patient today. We discussed the risks, benefits, and side effects of radiotherapy. I recommend radiotherapy to the *** to reduce her risk of locoregional recurrence by 2/3.  We discussed that radiation would take approximately *** weeks to complete and that I would give the patient a few weeks to heal following surgery before starting treatment planning. *** If chemotherapy were to be given, this would precede radiotherapy. We spoke about acute effects including skin irritation and fatigue as well as much less common late effects including internal organ injury or irritation. We spoke about the latest technology that is used to minimize the risk of late effects for patients undergoing radiotherapy to the breast or chest wall. No guarantees of treatment were given. The patient is enthusiastic about proceeding  with treatment. I look forward to participating in the patient's care.  I will await her referral back to me for postoperative follow-up and eventual CT simulation/treatment planning.  On date of service, in total, I spent *** minutes on this encounter. Patient was seen in person.   __________________________________________   Lonie Peak, MD  This document serves as a record of services personally performed by Lonie Peak, MD. It was created on her behalf by Neena Rhymes, a trained medical scribe. The creation of this record is based on the scribe's personal observations and the provider's statements to them. This document has been checked and approved by the attending provider.

## 2023-12-24 ENCOUNTER — Ambulatory Visit
Admission: RE | Admit: 2023-12-24 | Discharge: 2023-12-24 | Disposition: A | Discharge status: Home / Self Care | Payer: 59 | Source: Ambulatory Visit | Attending: Radiation Oncology | Admitting: Radiation Oncology

## 2023-12-24 ENCOUNTER — Telehealth: Payer: Self-pay | Admitting: Radiation Oncology

## 2023-12-24 DIAGNOSIS — D0511 Intraductal carcinoma in situ of right breast: Secondary | ICD-10-CM

## 2023-12-24 DIAGNOSIS — Z170 Estrogen receptor positive status [ER+]: Secondary | ICD-10-CM

## 2023-12-24 NOTE — Telephone Encounter (Signed)
 Per inbasket message and securechat from Dr. Basilio Cairo, pathology is back and appt is ok to remain on today's schedule. Spoke with pt's daughter Durwin Nora who verbalized understanding.

## 2023-12-27 ENCOUNTER — Encounter: Payer: Self-pay | Admitting: *Deleted

## 2023-12-27 DIAGNOSIS — D0511 Intraductal carcinoma in situ of right breast: Secondary | ICD-10-CM

## 2023-12-29 ENCOUNTER — Telehealth: Payer: Self-pay | Admitting: Hematology and Oncology

## 2023-12-29 ENCOUNTER — Telehealth: Payer: Self-pay | Admitting: Genetic Counselor

## 2023-12-29 NOTE — Telephone Encounter (Signed)
 Spoke to Carolyn Weber regarding questions about need for genetic counseling/coverage of testing. We discussed that Carolyn Weber would meet criteria for genetic testing given her personal and family history. However, Carolyn Weber had genetic testing through our program in 2019 with [employer]'s 84 gene MultiCancer panel that detected a VUS in ALK, but was otherwise normal. Genetic testing showed no pathogenic mutations. 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  Genes tested include: AIP, ALK, APC, ATM, AXIN2,BAP1,  BARD1, BLM, BMPR1A, BRCA1, BRCA2, BRIP1, CASR, CDC73, CDH1, CDK4, CDKN1B, CDKN1C, CDKN2A (p14ARF), CDKN2A (p16INK4a), CEBPA, CHEK2, CTNNA1, DICER1, DIS3L2, EGFR (c.2369C>T, p.Thr790Met variant only), EPCAM (Deletion/duplication testing only), FH, FLCN, GATA2, GPC3, GREM1 (Promoter region deletion/duplication testing only), HOXB13 (c.251G>A, p.Gly84Glu), HRAS, KIT, MAX, MEN1, MET, MITF (c.952G>A, p.Glu318Lys variant only), MLH1, MSH2, MSH3, MSH6, MUTYH, NBN, NF1, NF2, NTHL1, PALB2, PDGFRA, PHOX2B, PMS2, POLD1, POLE, POT1, PRKAR1A, PTCH1, PTEN, RAD50, RAD51C, RAD51D, RB1, RECQL4, RET, RUNX1, SDHAF2, SDHA (sequence changes only), SDHB, SDHC, SDHD, SMAD4, SMARCA4, SMARCB1, SMARCE1, STK11, SUFU, TERC, TERT, TMEM127, TP53, TSC1, TSC2, VHL, WRN and WT1.   We reviewed that technology has evolved since Carolyn Weber was evaluated, specifically with the addition of RNA analysis, which is able to detect additional variants for a small number of individuals. Carolyn Weber family history is concerning for a hereditary cause of cancer, but given previous normal testing, yield of additional testing is expected to be low.   Given this information and significant appointment demand for her recent DCIS diagnosis, Carolyn Weber has elected to cancel the genetic counseling appointment scheduled for 02/18/24. Encouraged her to continue checking in with our office, as there may be more significant advancements to  genetic testing technology moving forward. Carolyn Weber expressed understanding and has no further questions at this time.

## 2023-12-29 NOTE — Telephone Encounter (Signed)
 Scheduled appointment per 2/25 secure chat. Patient is aware of the made appointment.

## 2023-12-31 ENCOUNTER — Encounter (INDEPENDENT_AMBULATORY_CARE_PROVIDER_SITE_OTHER): Payer: 59 | Admitting: Ophthalmology

## 2023-12-31 DIAGNOSIS — I10 Essential (primary) hypertension: Secondary | ICD-10-CM | POA: Diagnosis not present

## 2023-12-31 DIAGNOSIS — H35033 Hypertensive retinopathy, bilateral: Secondary | ICD-10-CM

## 2023-12-31 DIAGNOSIS — E113293 Type 2 diabetes mellitus with mild nonproliferative diabetic retinopathy without macular edema, bilateral: Secondary | ICD-10-CM

## 2023-12-31 DIAGNOSIS — H348312 Tributary (branch) retinal vein occlusion, right eye, stable: Secondary | ICD-10-CM | POA: Diagnosis not present

## 2023-12-31 DIAGNOSIS — H43813 Vitreous degeneration, bilateral: Secondary | ICD-10-CM

## 2023-12-31 DIAGNOSIS — Z7984 Long term (current) use of oral hypoglycemic drugs: Secondary | ICD-10-CM

## 2024-01-17 ENCOUNTER — Ambulatory Visit
Admission: RE | Admit: 2024-01-17 | Discharge: 2024-01-17 | Disposition: A | Discharge status: Home / Self Care | Payer: 59 | Source: Ambulatory Visit | Attending: Radiation Oncology | Admitting: Radiation Oncology

## 2024-01-17 ENCOUNTER — Other Ambulatory Visit: Payer: Self-pay

## 2024-01-17 DIAGNOSIS — C50411 Malignant neoplasm of upper-outer quadrant of right female breast: Secondary | ICD-10-CM | POA: Insufficient documentation

## 2024-01-17 DIAGNOSIS — Z510 Encounter for antineoplastic radiation therapy: Secondary | ICD-10-CM | POA: Diagnosis present

## 2024-01-21 DIAGNOSIS — Z510 Encounter for antineoplastic radiation therapy: Secondary | ICD-10-CM | POA: Diagnosis not present

## 2024-01-26 ENCOUNTER — Ambulatory Visit (INDEPENDENT_AMBULATORY_CARE_PROVIDER_SITE_OTHER): Payer: 59 | Admitting: Podiatry

## 2024-01-26 ENCOUNTER — Ambulatory Visit

## 2024-01-26 ENCOUNTER — Other Ambulatory Visit: Payer: Self-pay

## 2024-01-26 ENCOUNTER — Encounter: Payer: Self-pay | Admitting: Podiatry

## 2024-01-26 ENCOUNTER — Ambulatory Visit
Admission: RE | Admit: 2024-01-26 | Discharge: 2024-01-26 | Disposition: A | Discharge status: Home / Self Care | Payer: 59 | Source: Ambulatory Visit | Attending: Radiation Oncology

## 2024-01-26 VITALS — Ht 64.0 in | Wt 190.7 lb

## 2024-01-26 DIAGNOSIS — M79675 Pain in left toe(s): Secondary | ICD-10-CM

## 2024-01-26 DIAGNOSIS — B351 Tinea unguium: Secondary | ICD-10-CM | POA: Diagnosis not present

## 2024-01-26 DIAGNOSIS — M79674 Pain in right toe(s): Secondary | ICD-10-CM

## 2024-01-26 DIAGNOSIS — Z510 Encounter for antineoplastic radiation therapy: Secondary | ICD-10-CM | POA: Diagnosis not present

## 2024-01-26 LAB — RAD ONC ARIA SESSION SUMMARY
Course Elapsed Days: 0
Plan Fractions Treated to Date: 1
Plan Prescribed Dose Per Fraction: 2.67 Gy
Plan Total Fractions Prescribed: 15
Plan Total Prescribed Dose: 40.05 Gy
Reference Point Dosage Given to Date: 2.67 Gy
Reference Point Session Dosage Given: 2.67 Gy
Session Number: 1

## 2024-01-27 ENCOUNTER — Other Ambulatory Visit: Payer: Self-pay

## 2024-01-27 ENCOUNTER — Ambulatory Visit
Admission: RE | Admit: 2024-01-27 | Discharge: 2024-01-27 | Disposition: A | Discharge status: Home / Self Care | Payer: 59 | Source: Ambulatory Visit | Attending: Radiation Oncology

## 2024-01-27 DIAGNOSIS — Z510 Encounter for antineoplastic radiation therapy: Secondary | ICD-10-CM | POA: Diagnosis not present

## 2024-01-27 LAB — RAD ONC ARIA SESSION SUMMARY
Course Elapsed Days: 1
Plan Fractions Treated to Date: 2
Plan Prescribed Dose Per Fraction: 2.67 Gy
Plan Total Fractions Prescribed: 15
Plan Total Prescribed Dose: 40.05 Gy
Reference Point Dosage Given to Date: 5.34 Gy
Reference Point Session Dosage Given: 2.67 Gy
Session Number: 2

## 2024-01-28 ENCOUNTER — Other Ambulatory Visit: Payer: Self-pay

## 2024-01-28 ENCOUNTER — Ambulatory Visit
Admission: RE | Admit: 2024-01-28 | Discharge: 2024-01-28 | Disposition: A | Discharge status: Home / Self Care | Payer: 59 | Source: Ambulatory Visit | Attending: Radiation Oncology | Admitting: Radiation Oncology

## 2024-01-28 DIAGNOSIS — Z510 Encounter for antineoplastic radiation therapy: Secondary | ICD-10-CM | POA: Diagnosis not present

## 2024-01-28 LAB — RAD ONC ARIA SESSION SUMMARY
Course Elapsed Days: 2
Plan Fractions Treated to Date: 3
Plan Prescribed Dose Per Fraction: 2.67 Gy
Plan Total Fractions Prescribed: 15
Plan Total Prescribed Dose: 40.05 Gy
Reference Point Dosage Given to Date: 8.01 Gy
Reference Point Session Dosage Given: 2.67 Gy
Session Number: 3

## 2024-01-30 NOTE — Progress Notes (Signed)
  Subjective:  Patient ID: Carolyn Weber, female    DOB: 1940/11/24,  MRN: [id number]  Carolyn Weber presents to clinic today for at risk foot care with history of diabetic neuropathy and painful mycotic toenails of both feet that are difficult to trim. Pain interferes with daily activities and wearing enclosed shoe gear comfortably.  Chief Complaint  Patient presents with   Nail Problem    Pt is here for [facility] unsure of last A1C PCP is Dr Nehemiah Settle and LOV was in October.   New problem(s): None.   PCP is Renford Dills, MD.  Allergies  Allergen Reactions   Aspirin     Other reaction(s): stomach upset, also " messes with my kidneys"   Other     "Mycin drugs" not sure which one     Review of Systems: Negative except as noted in the HPI.  Objective: No changes noted in today's physical examination. There were no vitals filed for this visit. Carolyn Weber is a pleasant 83 y.o. female in NAD. AAO x 3.  Vascular Examination: Palpable pedal pulses. CFT immediate b/l. No edema. No pain with calf compression b/l. Skin temperature gradient WNL b/l. No ischemia or gangrene noted b/l LE. No cyanosis or clubbing noted b/l LE.  Neurological Examination: Sensation grossly intact b/l with 10 gram monofilament. Vibratory sensation intact b/l.   Dermatological Examination: Pedal skin with normal turgor, texture and tone b/l. Toenails 1-5 b/l thick, discolored, elongated with subungual debris and pain on dorsal palpation.  No corns, calluses nor porokeratotic lesions noted.  Musculoskeletal Examination: Muscle strength 5/5 to b/l LE. HAV with bunion deformity noted b/l LE. Hammertoe deformity noted 2-5 b/l.  Radiographs: None  Assessment/Plan: 1. Pain due to onychomycosis of toenails of both feet     Consent given for treatment. Patient examined. All patient's and/or POA's questions/concerns addressed on today's visit. Toenails 1-5 debrided in length and girth without incident. Continue foot  and shoe inspections daily. Monitor blood glucose per PCP/Endocrinologist's recommendations. Continue soft, supportive shoe gear daily. Report any pedal injuries to medical professional. Call office if there are any questions/concerns. -Patient/POA to call should there be question/concern in the interim.   Return in about 3 months (around 04/27/2024).  Freddie Breech, DPM      [place] LOCATION: [address] [place] [address]                   Office [phone]   [facility] LOCATION: [address] [place], [place] [address] Office [phone]

## 2024-01-31 ENCOUNTER — Ambulatory Visit
Admission: RE | Admit: 2024-01-31 | Discharge: 2024-01-31 | Disposition: A | Discharge status: Home / Self Care | Payer: 59 | Source: Ambulatory Visit | Attending: Radiation Oncology

## 2024-01-31 ENCOUNTER — Ambulatory Visit

## 2024-01-31 ENCOUNTER — Other Ambulatory Visit: Payer: Self-pay

## 2024-01-31 DIAGNOSIS — Z510 Encounter for antineoplastic radiation therapy: Secondary | ICD-10-CM | POA: Diagnosis not present

## 2024-01-31 LAB — RAD ONC ARIA SESSION SUMMARY
Course Elapsed Days: 5
Plan Fractions Treated to Date: 4
Plan Prescribed Dose Per Fraction: 2.67 Gy
Plan Total Fractions Prescribed: 15
Plan Total Prescribed Dose: 40.05 Gy
Reference Point Dosage Given to Date: 10.68 Gy
Reference Point Session Dosage Given: 2.67 Gy
Session Number: 4

## 2024-02-01 ENCOUNTER — Ambulatory Visit
Admission: RE | Admit: 2024-02-01 | Discharge: 2024-02-01 | Disposition: A | Discharge status: Home / Self Care | Payer: 59 | Source: Ambulatory Visit | Attending: Radiation Oncology | Admitting: Radiation Oncology

## 2024-02-01 ENCOUNTER — Other Ambulatory Visit: Payer: Self-pay

## 2024-02-01 DIAGNOSIS — Z510 Encounter for antineoplastic radiation therapy: Secondary | ICD-10-CM | POA: Diagnosis present

## 2024-02-01 DIAGNOSIS — C50411 Malignant neoplasm of upper-outer quadrant of right female breast: Secondary | ICD-10-CM | POA: Diagnosis present

## 2024-02-01 LAB — RAD ONC ARIA SESSION SUMMARY
Course Elapsed Days: 6
Plan Fractions Treated to Date: 5
Plan Prescribed Dose Per Fraction: 2.67 Gy
Plan Total Fractions Prescribed: 15
Plan Total Prescribed Dose: 40.05 Gy
Reference Point Dosage Given to Date: 13.35 Gy
Reference Point Session Dosage Given: 2.67 Gy
Session Number: 5

## 2024-02-02 ENCOUNTER — Other Ambulatory Visit: Payer: Self-pay

## 2024-02-02 ENCOUNTER — Ambulatory Visit
Admission: RE | Admit: 2024-02-02 | Discharge: 2024-02-02 | Disposition: A | Discharge status: Home / Self Care | Payer: 59 | Source: Ambulatory Visit | Attending: Radiation Oncology | Admitting: Radiation Oncology

## 2024-02-02 DIAGNOSIS — Z510 Encounter for antineoplastic radiation therapy: Secondary | ICD-10-CM | POA: Diagnosis not present

## 2024-02-02 LAB — RAD ONC ARIA SESSION SUMMARY
Course Elapsed Days: 7
Plan Fractions Treated to Date: 6
Plan Prescribed Dose Per Fraction: 2.67 Gy
Plan Total Fractions Prescribed: 15
Plan Total Prescribed Dose: 40.05 Gy
Reference Point Dosage Given to Date: 16.02 Gy
Reference Point Session Dosage Given: 2.67 Gy
Session Number: 6

## 2024-02-03 ENCOUNTER — Other Ambulatory Visit: Payer: Self-pay

## 2024-02-03 ENCOUNTER — Ambulatory Visit
Admission: RE | Admit: 2024-02-03 | Discharge: 2024-02-03 | Disposition: A | Discharge status: Home / Self Care | Payer: 59 | Source: Ambulatory Visit | Attending: Radiation Oncology

## 2024-02-03 DIAGNOSIS — Z510 Encounter for antineoplastic radiation therapy: Secondary | ICD-10-CM | POA: Diagnosis not present

## 2024-02-03 LAB — RAD ONC ARIA SESSION SUMMARY
Course Elapsed Days: 8
Plan Fractions Treated to Date: 7
Plan Prescribed Dose Per Fraction: 2.67 Gy
Plan Total Fractions Prescribed: 15
Plan Total Prescribed Dose: 40.05 Gy
Reference Point Dosage Given to Date: 18.69 Gy
Reference Point Session Dosage Given: 2.67 Gy
Session Number: 7

## 2024-02-04 ENCOUNTER — Other Ambulatory Visit: Payer: Self-pay

## 2024-02-04 ENCOUNTER — Ambulatory Visit
Admission: RE | Admit: 2024-02-04 | Discharge: 2024-02-04 | Disposition: A | Discharge status: Home / Self Care | Payer: 59 | Source: Ambulatory Visit | Attending: Radiation Oncology | Admitting: Radiation Oncology

## 2024-02-04 DIAGNOSIS — Z510 Encounter for antineoplastic radiation therapy: Secondary | ICD-10-CM | POA: Diagnosis not present

## 2024-02-04 LAB — RAD ONC ARIA SESSION SUMMARY
Course Elapsed Days: 9
Plan Fractions Treated to Date: 8
Plan Prescribed Dose Per Fraction: 2.67 Gy
Plan Total Fractions Prescribed: 15
Plan Total Prescribed Dose: 40.05 Gy
Reference Point Dosage Given to Date: 21.36 Gy
Reference Point Session Dosage Given: 2.67 Gy
Session Number: 8

## 2024-02-05 ENCOUNTER — Other Ambulatory Visit: Payer: Self-pay | Admitting: Hematology and Oncology

## 2024-02-07 ENCOUNTER — Other Ambulatory Visit: Payer: Self-pay

## 2024-02-07 ENCOUNTER — Ambulatory Visit
Admission: RE | Admit: 2024-02-07 | Discharge: 2024-02-07 | Disposition: A | Discharge status: Home / Self Care | Payer: 59 | Source: Ambulatory Visit | Attending: Radiation Oncology

## 2024-02-07 ENCOUNTER — Ambulatory Visit
Admission: RE | Admit: 2024-02-07 | Discharge: 2024-02-07 | Disposition: A | Discharge status: Home / Self Care | Source: Ambulatory Visit | Attending: Radiation Oncology | Admitting: Radiation Oncology

## 2024-02-07 DIAGNOSIS — Z510 Encounter for antineoplastic radiation therapy: Secondary | ICD-10-CM | POA: Diagnosis not present

## 2024-02-07 LAB — RAD ONC ARIA SESSION SUMMARY
Course Elapsed Days: 12
Plan Fractions Treated to Date: 9
Plan Prescribed Dose Per Fraction: 2.67 Gy
Plan Total Fractions Prescribed: 15
Plan Total Prescribed Dose: 40.05 Gy
Reference Point Dosage Given to Date: 24.03 Gy
Reference Point Session Dosage Given: 2.67 Gy
Session Number: 9

## 2024-02-08 ENCOUNTER — Other Ambulatory Visit: Payer: Self-pay

## 2024-02-08 ENCOUNTER — Ambulatory Visit
Admission: RE | Admit: 2024-02-08 | Discharge: 2024-02-08 | Disposition: A | Discharge status: Home / Self Care | Payer: 59 | Source: Ambulatory Visit | Attending: Radiation Oncology

## 2024-02-08 DIAGNOSIS — Z510 Encounter for antineoplastic radiation therapy: Secondary | ICD-10-CM | POA: Diagnosis not present

## 2024-02-08 LAB — RAD ONC ARIA SESSION SUMMARY
Course Elapsed Days: 13
Plan Fractions Treated to Date: 10
Plan Prescribed Dose Per Fraction: 2.67 Gy
Plan Total Fractions Prescribed: 15
Plan Total Prescribed Dose: 40.05 Gy
Reference Point Dosage Given to Date: 26.7 Gy
Reference Point Session Dosage Given: 2.67 Gy
Session Number: 10

## 2024-02-09 ENCOUNTER — Other Ambulatory Visit: Payer: Self-pay

## 2024-02-09 ENCOUNTER — Ambulatory Visit
Admission: RE | Admit: 2024-02-09 | Discharge: 2024-02-09 | Disposition: A | Discharge status: Home / Self Care | Payer: 59 | Source: Ambulatory Visit | Attending: Radiation Oncology

## 2024-02-09 DIAGNOSIS — Z510 Encounter for antineoplastic radiation therapy: Secondary | ICD-10-CM | POA: Diagnosis not present

## 2024-02-09 LAB — RAD ONC ARIA SESSION SUMMARY
Course Elapsed Days: 14
Plan Fractions Treated to Date: 11
Plan Prescribed Dose Per Fraction: 2.67 Gy
Plan Total Fractions Prescribed: 15
Plan Total Prescribed Dose: 40.05 Gy
Reference Point Dosage Given to Date: 29.37 Gy
Reference Point Session Dosage Given: 2.67 Gy
Session Number: 11

## 2024-02-10 ENCOUNTER — Ambulatory Visit
Admission: RE | Admit: 2024-02-10 | Discharge: 2024-02-10 | Discharge status: Home / Self Care | Payer: 59 | Source: Ambulatory Visit | Attending: Radiation Oncology

## 2024-02-10 ENCOUNTER — Other Ambulatory Visit: Payer: Self-pay

## 2024-02-10 DIAGNOSIS — Z510 Encounter for antineoplastic radiation therapy: Secondary | ICD-10-CM | POA: Diagnosis not present

## 2024-02-10 LAB — RAD ONC ARIA SESSION SUMMARY
Course Elapsed Days: 15
Plan Fractions Treated to Date: 12
Plan Prescribed Dose Per Fraction: 2.67 Gy
Plan Total Fractions Prescribed: 15
Plan Total Prescribed Dose: 40.05 Gy
Reference Point Dosage Given to Date: 32.04 Gy
Reference Point Session Dosage Given: 2.67 Gy
Session Number: 12

## 2024-02-11 ENCOUNTER — Ambulatory Visit
Admission: RE | Admit: 2024-02-11 | Discharge: 2024-02-11 | Disposition: A | Discharge status: Home / Self Care | Payer: 59 | Source: Ambulatory Visit | Attending: Radiation Oncology | Admitting: Radiation Oncology

## 2024-02-11 ENCOUNTER — Other Ambulatory Visit: Payer: Self-pay

## 2024-02-11 DIAGNOSIS — Z510 Encounter for antineoplastic radiation therapy: Secondary | ICD-10-CM | POA: Diagnosis not present

## 2024-02-11 LAB — RAD ONC ARIA SESSION SUMMARY
Course Elapsed Days: 16
Plan Fractions Treated to Date: 13
Plan Prescribed Dose Per Fraction: 2.67 Gy
Plan Total Fractions Prescribed: 15
Plan Total Prescribed Dose: 40.05 Gy
Reference Point Dosage Given to Date: 34.71 Gy
Reference Point Session Dosage Given: 2.67 Gy
Session Number: 13

## 2024-02-14 ENCOUNTER — Ambulatory Visit
Admission: RE | Admit: 2024-02-14 | Discharge: 2024-02-14 | Disposition: A | Discharge status: Home / Self Care | Source: Ambulatory Visit | Attending: Radiation Oncology | Admitting: Radiation Oncology

## 2024-02-14 ENCOUNTER — Ambulatory Visit
Admission: RE | Admit: 2024-02-14 | Discharge: 2024-02-14 | Disposition: A | Discharge status: Home / Self Care | Payer: 59 | Source: Ambulatory Visit | Attending: Radiation Oncology | Admitting: Radiation Oncology

## 2024-02-14 ENCOUNTER — Other Ambulatory Visit: Payer: Self-pay

## 2024-02-14 DIAGNOSIS — Z510 Encounter for antineoplastic radiation therapy: Secondary | ICD-10-CM | POA: Diagnosis not present

## 2024-02-14 LAB — RAD ONC ARIA SESSION SUMMARY
Course Elapsed Days: 19
Plan Fractions Treated to Date: 14
Plan Prescribed Dose Per Fraction: 2.67 Gy
Plan Total Fractions Prescribed: 15
Plan Total Prescribed Dose: 40.05 Gy
Reference Point Dosage Given to Date: 37.38 Gy
Reference Point Session Dosage Given: 2.67 Gy
Session Number: 14

## 2024-02-15 ENCOUNTER — Inpatient Hospital Stay (HOSPITAL_BASED_OUTPATIENT_CLINIC_OR_DEPARTMENT_OTHER): Payer: 59 | Attending: Hematology and Oncology | Admitting: Hematology and Oncology

## 2024-02-15 ENCOUNTER — Ambulatory Visit
Admission: RE | Admit: 2024-02-15 | Discharge: 2024-02-15 | Disposition: A | Discharge status: Home / Self Care | Payer: 59 | Source: Ambulatory Visit | Attending: Radiation Oncology | Admitting: Radiation Oncology

## 2024-02-15 ENCOUNTER — Other Ambulatory Visit: Payer: Self-pay

## 2024-02-15 VITALS — BP 142/63 | HR 95 | Temp 98.2°F | Resp 18 | Ht 64.0 in | Wt 191.2 lb

## 2024-02-15 DIAGNOSIS — Z170 Estrogen receptor positive status [ER+]: Secondary | ICD-10-CM | POA: Insufficient documentation

## 2024-02-15 DIAGNOSIS — C50211 Malignant neoplasm of upper-inner quadrant of right female breast: Secondary | ICD-10-CM | POA: Insufficient documentation

## 2024-02-15 DIAGNOSIS — Z79811 Long term (current) use of aromatase inhibitors: Secondary | ICD-10-CM | POA: Insufficient documentation

## 2024-02-15 DIAGNOSIS — Z510 Encounter for antineoplastic radiation therapy: Secondary | ICD-10-CM | POA: Diagnosis not present

## 2024-02-15 LAB — RAD ONC ARIA SESSION SUMMARY
Course Elapsed Days: 20
Plan Fractions Treated to Date: 15
Plan Prescribed Dose Per Fraction: 2.67 Gy
Plan Total Fractions Prescribed: 15
Plan Total Prescribed Dose: 40.05 Gy
Reference Point Dosage Given to Date: 40.05 Gy
Reference Point Session Dosage Given: 2.67 Gy
Session Number: 15

## 2024-02-15 MED ORDER — EXEMESTANE 25 MG PO TABS: 25.0000 mg | ORAL_TABLET | Freq: Every day | ORAL | 3 refills | Status: AC

## 2024-02-15 NOTE — Assessment & Plan Note (Signed)
 01/06/2019:Right lumpectomy: IDC, grade 1, 0.8 cm, margins negative, negative for LV I or PNI, ER 95%, PR 90%, HER-2 negative, Ki-67 5% T1BN0 stage Ia Given the favorable prognostic features and her age, radiation was omitted Current treatment: Anastrozole started 02/20/2019   Anastrozole toxicities: Denies any adverse effects to anastrozole.   Breast cancer recurrence: 1.  Mammogram 11/11/2023: Calcifications right breast, diagnostic right breast mammogram 11/24/2023: 0.8 cm group of calcifications, stereotactic biopsy: 11/29/2023: High-grade DCIS solid type with apocrine features and necrosis, ER 50%, PR 0% 2. 12/22/2023: Left lumpectomy: High-grade DCIS with focal necrosis, margins negative ER 50% weak, PR 0%  Treatment plan: Discussed with radiation oncology about pros and cons of radiation And estrogen therapy: Will switch to letrozole for 5 years

## 2024-02-15 NOTE — Progress Notes (Signed)
 Patient Care Team: Renford Dills, MD as PCP - General (Internal Medicine) Claud Kelp, MD as Consulting Physician (General Surgery) Serena Croissant, MD as Consulting Physician (Hematology and Oncology) Pershing Proud, RN as Oncology Nurse Navigator Donnelly Angelica, RN as Oncology Nurse Navigator  DIAGNOSIS:  Encounter Diagnosis  Name Primary?   Malignant neoplasm of upper-inner quadrant of right breast in female, estrogen receptor positive (HCC) Yes    SUMMARY OF ONCOLOGIC HISTORY: Oncology History  Malignant neoplasm of upper-inner quadrant of right breast in female, estrogen receptor positive (HCC)  09/21/2018 Initial Diagnosis   Screening mammogram detected right breast mass 2:30 position.  3 cm from nipple 0.9 x 0.6 x 0.8 cm, ultrasound axilla negative biopsy revealed grade 1-2 IDC ER 95%, PR 90%, HER-2 1+ negative, Ki-67 5%, T1b N0 stage Ia clinical stage   10/10/2018 Cancer Staging   Staging form: Breast, AJCC 8th Edition - Clinical: Stage IA (cT1b, cN0, cM0, G2, ER+, PR+, HER2-) - Signed by Serena Croissant, MD on 10/10/2018   10/28/2018 Genetic Testing   Genetic testing showed no pathogenic mutations. 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  Genes tested include: AIP, ALK, APC, ATM, AXIN2,BAP1,  BARD1, BLM, BMPR1A, BRCA1, BRCA2, BRIP1, CASR, CDC73, CDH1, CDK4, CDKN1B, CDKN1C, CDKN2A (p14ARF), CDKN2A (p16INK4a), CEBPA, CHEK2, CTNNA1, DICER1, DIS3L2, EGFR (c.2369C>T, p.Thr790Met variant only), EPCAM (Deletion/duplication testing only), FH, FLCN, GATA2, GPC3, GREM1 (Promoter region deletion/duplication testing only), HOXB13 (c.251G>A, p.Gly84Glu), HRAS, KIT, MAX, MEN1, MET, MITF (c.952G>A, p.Glu318Lys variant only), MLH1, MSH2, MSH3, MSH6, MUTYH, NBN, NF1, NF2, NTHL1, PALB2, PDGFRA, PHOX2B, PMS2, POLD1, POLE, POT1, PRKAR1A, PTCH1, PTEN, RAD50, RAD51C, RAD51D, RB1, RECQL4, RET, RUNX1, SDHAF2, SDHA (sequence changes only), SDHB, SDHC, SDHD, SMAD4,  SMARCA4, SMARCB1, SMARCE1, STK11, SUFU, TERC, TERT, TMEM127, TP53, TSC1, TSC2, VHL, WRN and WT1.      01/06/2019 Surgery   Right lumpectomy: IDC, grade 1, 0.8 cm, margins negative, negative for LV I or PNI, ER 95%, PR 90%, HER-2 negative, Ki-67 5% T1BN0 stage Ia   01/06/2019 Cancer Staging   Staging form: Breast, AJCC 8th Edition - Pathologic stage from 01/06/2019: Stage IA (pT1b, pN0, cM0, G1, ER+, PR+, HER2-) - Signed by Loa Socks, NP on 01/25/2019   02/2019 -  Anti-estrogen oral therapy   Anastrozole daily   Ductal carcinoma in situ (DCIS) of right breast  12/13/2023 Initial Diagnosis   Ductal carcinoma in situ (DCIS) of right breast   12/24/2023 Cancer Staging   Staging form: Breast, AJCC 8th Edition - Pathologic stage from 12/24/2023: Stage 0 (pTis (DCIS), pN0, cM0, ER+, PR-, HER2: Not Assessed) - Signed by Erven Colla, PA-C on 12/24/2023 Nuclear grade: G3     CHIEF COMPLIANT: Follow-up after radiation is completed  HISTORY OF PRESENT ILLNESS:   History of Present Illness Carolyn Weber, a patient with a history of cancer, presents for a follow-up visit after undergoing radiation treatment. She reports that her radiation treatment is going well and she has not experienced any adverse effects yet. She has been advised to continue applying her current medication.  She has been on Anastrozole, a hormone therapy pill, but expresses concerns about the side effects, particularly diarrhea. She recalls a previous medication that caused severe diarrhea and is worried about a similar experience with a new medication, Letrozole. She also mentions that she is currently taking Metformin, which she believes may be contributing to her ongoing issues with loose stools.  In addition to her cancer  treatment, Carolyn Weber also has a history of high blood pressure and diabetes, for which she is taking Amlodipine and Metformin, respectively. She reports that she has been experiencing issues with loose  stools, which she believes may be a side effect of her Metformin medication.  She is also concerned about potential side effects of a new medication, Exemestane, that her doctor is considering switching her to. She is particularly worried about potential hair thinning, vaginal dryness, and joint stiffness.     ALLERGIES:  is allergic to aspirin and other.  MEDICATIONS:  Current Outpatient Medications  Medication Sig Dispense Refill   [START ON 02/29/2024] exemestane (AROMASIN) 25 MG tablet Take 1 tablet (25 mg total) by mouth daily. 90 tablet 3   acetaminophen (TYLENOL) 650 MG CR tablet Take 650 mg by mouth every 8 (eight) hours as needed for pain.     amLODipine (NORVASC) 10 MG tablet Take 1 tablet by mouth daily.     Ascorbic Acid (VITAMIN C) 500 MG CHEW 1 tablet     atorvastatin (LIPITOR) 40 MG tablet Take 1 tablet by mouth every evening.     Besifloxacin HCl (BESIVANCE) 0.6 % SUSP Place 1 drop into the right eye See admin instructions. Drop 1 drop in the right eye the day of and the day after eye injection. (Patient not taking: Reported on 12/23/2023)     BLACK CURRANT SEED OIL PO Take by mouth.     calcium carbonate (OS-CAL) 600 MG TABS Take 600 mg by mouth 2 (two) times daily with a meal.     cetirizine (ZYRTEC ALLERGY) 10 MG tablet 1 tablet     cholecalciferol (VITAMIN D) 1000 UNITS tablet Take 1,000 Units by mouth 2 (two) times daily.      clobetasol cream (TEMOVATE) 0.05 % Apply 1 application  topically daily as needed (irritation).     clopidogrel (PLAVIX) 75 MG tablet Take 1 tablet by mouth daily.     Elderberry-Vitamin C-Zinc (ELDERBERRY EXTRACT PO) Take by mouth.     metFORMIN (GLUCOPHAGE-XR) 500 MG 24 hr tablet Take 1 tablet (500 mg total) by mouth daily with breakfast.     Multiple Vitamin (MULTIVITAMIN) tablet Take 0.5 tablets by mouth 2 (two) times daily.      Polyethyl Glycol-Propyl Glycol (SYSTANE OP) Apply 1 drop to eye every morning.     traMADol (ULTRAM) 50 MG tablet  Take 1 tablet (50 mg total) by mouth every 6 (six) hours as needed. 5 tablet 0   Vibegron (GEMTESA) 75 MG TABS Take 1 tablet by mouth daily. 30 tablet    No current facility-administered medications for this visit.    PHYSICAL EXAMINATION: ECOG PERFORMANCE STATUS: 1 - Symptomatic but completely ambulatory  Vitals:   02/15/24 1214  BP: (!) 142/63  Pulse: 95  Resp: 18  Temp: 98.2 F (36.8 C)  SpO2: 100%   Filed Weights   02/15/24 1214  Weight: 191 lb 3.2 oz (86.7 kg)     LABORATORY DATA:  I have reviewed the data as listed    Latest Ref Rng & Units 12/16/2023    2:30 PM 12/30/2018    1:24 PM 12/13/2018    9:45 AM  CMP  Glucose 70 - 99 mg/dL 161  096    BUN 8 - 23 mg/dL 26  19    Creatinine 0.45 - 1.00 mg/dL 4.09  8.11  9.14   Sodium 135 - 145 mmol/L 142  136    Potassium 3.5 - 5.1 mmol/L  3.8  4.2    Chloride 98 - 111 mmol/L 107  103    CO2 22 - 32 mmol/L 25  25    Calcium 8.9 - 10.3 mg/dL 9.6  60.4    Total Protein 6.5 - 8.1 g/dL  7.5    Total Bilirubin 0.3 - 1.2 mg/dL  0.5    Alkaline Phos 38 - 126 U/L  51    AST 15 - 41 U/L  23    ALT 0 - 44 U/L  16      Lab Results  Component Value Date   WBC 5.7 12/30/2018   HGB 12.9 12/30/2018   HCT 41.3 12/30/2018   MCV 86.2 12/30/2018   PLT 218 12/30/2018   NEUTROABS 3.8 12/30/2018    ASSESSMENT & PLAN:  Malignant neoplasm of upper-inner quadrant of right breast in female, estrogen receptor positive (HCC) 01/06/2019:Right lumpectomy: IDC, grade 1, 0.8 cm, margins negative, negative for LV I or PNI, ER 95%, PR 90%, HER-2 negative, Ki-67 5% T1BN0 stage Ia Given the favorable prognostic features and her age, radiation was omitted Current treatment: Anastrozole started 02/20/2019   Anastrozole toxicities: Denies any adverse effects to anastrozole.   Breast cancer recurrence: 1.  Mammogram 11/11/2023: Calcifications right breast, diagnostic right breast mammogram 11/24/2023: 0.8 cm group of calcifications, stereotactic biopsy:  11/29/2023: High-grade DCIS solid type with apocrine features and necrosis, ER 50%, PR 0% 2. 12/22/2023: Left lumpectomy: High-grade DCIS with focal necrosis, margins negative ER 50% weak, PR 0% 3.  Adjuvant radiation completed 02/15/2024  Treatment plan: Adjuvant antiestrogen therapy with exemestane.  Patient previously could not tolerate letrozole.  Anastrozole did not work because she developed recurrent disease.  I sent a new prescription for exemestane.  She will call us if she has any problems filling it. ------------------------------------- Assessment and Plan Assessment & Plan Ductal carcinoma in situ (DCIS) Completed radiation therapy, asymptomatic. Estrogen receptor positive with weak staining, minimal estrogen dependency. Anastrozole reduced estrogen levels, cancer cells adapted. Limited benefit from ongoing hormone therapy. - Discontinue anastrozole for two weeks before starting exemestane. - Initiate exemestane after a two-week break from anastrozole. - Discussed potential side effects of exemestane: hot flashes, joint stiffness, osteoporosis risk, hair thinning, vaginal dryness. - Continue bone density monitoring every two years. - Encourage regular exercise to improve bone density.  Malignant neoplasm of upper-inner quadrant of right breast, estrogen receptor positive Estrogen receptor positive with weak staining, reduced estrogen dependency. Completed surgery and radiation therapy. Focus on hormone therapy for residual microscopic disease. Limited benefit from hormone therapy. - Switch from anastrozole to exemestane after a two-week break. - Monitor for exemestane side effects and adjust treatment if necessary.  Diarrhea secondary to metformin Diarrhea likely caused by metformin, persistent symptoms impacting quality of life. - Discuss with primary care physician about changing metformin to an alternative diabetes medication.  Follow-up Plan for end of treatment  recommendations and survivorship care discussion. - Schedule a follow-up visit in three months for an end of treatment summary and survivorship care discussion. - Plan to see the oncologist in six months following the end of treatment visit.      No orders of the defined types were placed in this encounter.  The patient has a good understanding of the overall plan. she agrees with it. she will call with any problems that may develop before the next visit here. Total time spent: 30 mins including face to face time and time spent for planning, charting and co-ordination of care  Viinay K Donae Kueker, MD 02/15/24

## 2024-02-17 NOTE — Radiation Completion Notes (Signed)
 Patient Name: Carolyn Weber, Carolyn Weber MRN: [id number] Date of Birth: [birth year] Referring Physician: Cameron Cea, M.D. Date of Service: 2024-02-17 Radiation Oncologist: Colie Dawes, M.D. [facility] [place]                             RADIATION ONCOLOGY END OF TREATMENT NOTE     Diagnosis: D05.11 Intraductal carcinoma in situ of right breast Staging on 2019-01-06: Malignant neoplasm of upper-inner quadrant of right breast in female, estrogen receptor positive (HCC) T=pT1b, N=pN0, M=cM0 Staging on 2018-10-10: Malignant neoplasm of upper-inner quadrant of right breast in female, estrogen receptor positive (HCC) T=cT1b, N=cN0, M=cM0 Staging on 2023-12-24: Ductal carcinoma in situ (DCIS) of right breast T=pTis (DCIS), N=pN0, M=cM0 Intent: Curative     ==========DELIVERED PLANS==========  First Treatment Date: 2024-01-26 Last Treatment Date: 2024-02-15   Plan Name: Breast_R Site: Breast, Right Technique: 3D Mode: Photon Dose Per Fraction: 2.67 Gy Prescribed Dose (Delivered / Prescribed): 40.05 Gy / 40.05 Gy Prescribed Fxs (Delivered / Prescribed): 15 / 15     ==========ON TREATMENT VISIT DATES========== 2024-01-31, 2024-02-07, 2024-02-14     ==========UPCOMING VISITS==========       ==========APPENDIX - ON TREATMENT VISIT NOTES==========   See weekly On Treatment Notes in Epic for details in the Media tab (listed as Progress notes on the On Treatment Visit Dates listed above).

## 2024-02-18 ENCOUNTER — Encounter: Payer: 59 | Admitting: Genetic Counselor

## 2024-02-18 ENCOUNTER — Other Ambulatory Visit: Payer: 59

## 2024-03-10 NOTE — Progress Notes (Incomplete)
  Carolyn Weber presents today for follow-up after completing radiation to her right breast on 02/15/2024. Patient is doing well post radiation, no issues or concerns  Pain:Denies any pain Skin: Darkness and peeling on breast. Encouraged patient to use vitamin E oil. ROM: Patient denies Lymphedema: Patient denies MedOnc F/U: June 13, 2024 Other issues of note:  None Pt reports Yes No Comments  Tamoxifen []  [x]    Letrozole  []  [x]    Anastrazole []  [x]    Mammogram []  Date: 11/10/2023 []      Encouraged the patient to call the office with any questions or concerns.

## 2024-03-13 ENCOUNTER — Telehealth: Payer: Self-pay

## 2024-03-13 NOTE — Telephone Encounter (Signed)
 Pt called and reports she is having loose stools daily, reporting incontinence but denying diarrhea. One stool a day, but loose and incontinent. She is asking if this is caused by exemestane . Advised pt this is not a common side effect. She was advised to consult her MD for this concern.

## 2024-03-13 NOTE — Telephone Encounter (Signed)
 Error - duplicate

## 2024-03-16 ENCOUNTER — Ambulatory Visit
Admission: RE | Admit: 2024-03-16 | Discharge: 2024-03-16 | Disposition: A | Discharge status: Home / Self Care | Source: Ambulatory Visit | Attending: Radiation Oncology | Admitting: Radiation Oncology

## 2024-03-29 ENCOUNTER — Ambulatory Visit: Payer: 59 | Admitting: Podiatry

## 2024-04-05 ENCOUNTER — Ambulatory Visit (INDEPENDENT_AMBULATORY_CARE_PROVIDER_SITE_OTHER): Payer: 59 | Admitting: Podiatry

## 2024-04-05 ENCOUNTER — Encounter: Payer: Self-pay | Admitting: Podiatry

## 2024-04-05 DIAGNOSIS — M79675 Pain in left toe(s): Secondary | ICD-10-CM | POA: Diagnosis not present

## 2024-04-05 DIAGNOSIS — B351 Tinea unguium: Secondary | ICD-10-CM | POA: Diagnosis not present

## 2024-04-05 DIAGNOSIS — M79674 Pain in right toe(s): Secondary | ICD-10-CM

## 2024-04-05 DIAGNOSIS — E1142 Type 2 diabetes mellitus with diabetic polyneuropathy: Secondary | ICD-10-CM | POA: Diagnosis not present

## 2024-04-10 NOTE — Progress Notes (Signed)
  Subjective:  Patient ID: Carolyn Weber, female    DOB: [birth year],  MRN: [id number]  Carolyn Weber presents to clinic today for at risk foot care with history of diabetic neuropathy and painful mycotic toenails x 10 which interfere with daily activities. Pain is relieved with periodic professional debridement.  Chief Complaint  Patient presents with   [facility]    Rm16/diabetic/A1C 6.3/last visit April 2025   New problem(s): None.   PCP is Carolyn Star, MD.  Allergies  Allergen Reactions   Aspirin     Other reaction(s): stomach upset, also " messes with my kidneys"   Other     "Mycin drugs" not sure which one     Review of Systems: Negative except as noted in the HPI.  Objective: No changes noted in today's physical examination. There were no vitals filed for this visit. Carolyn Weber is a pleasant 83 y.o. female in NAD. AAO x 3.  Vascular Examination: Capillary refill time immediate b/l. Vascular status intact b/l with palpable pedal pulses. Pedal hair present b/l. No pain with calf compression b/l. Skin temperature gradient WNL b/l. No cyanosis or clubbing b/l. No ischemia or gangrene noted b/l.   Neurological Examination: Sensation grossly intact b/l with 10 gram monofilament.  Pt has subjective symptoms of neuropathy.  Dermatological Examination: Pedal skin with normal turgor, texture and tone b/l.  No open wounds. No interdigital macerations.   Toenails 1-5 b/l thick, discolored, elongated with subungual debris and pain on dorsal palpation.   No hyperkeratotic nor porokeratotic lesions present on today's visit.  Musculoskeletal Examination: Muscle strength 5/5 to all lower extremity muscle groups bilaterally. HAV with bunion deformity noted b/l LE. Hammertoe deformity noted 2-5 b/l.  Radiographs: None  Assessment/Plan: 1. Pain due to onychomycosis of toenails of both feet   2. Diabetic peripheral neuropathy associated with type 2 diabetes mellitus (HCC)     -Patient was evaluated today. All questions/concerns addressed on today's visit. -Continue foot and shoe inspections daily. Monitor blood glucose per PCP/Endocrinologist's recommendations. -Continue supportive shoe gear daily. -Mycotic toenails 2-5 bilaterally were debrided in length and girth with sterile nail nippers and dremel and bilateral great toes gently filed with dremel without iatrogenic bleeding. -Patient/POA to call should there be question/concern in the interim.   Return in about 9 weeks (around 06/07/2024).  Carolyn Weber, DPM      [place] LOCATION: [address], [place] [address]                   Office [phone]   [facility] LOCATION: [address] [place], [place] [address] Office [phone]

## 2024-04-24 ENCOUNTER — Other Ambulatory Visit: Payer: Self-pay | Admitting: Hematology and Oncology

## 2024-06-07 ENCOUNTER — Ambulatory Visit: Payer: 59 | Admitting: Podiatry

## 2024-06-13 ENCOUNTER — Telehealth: Payer: Self-pay

## 2024-06-13 ENCOUNTER — Encounter: Admitting: Nurse Practitioner

## 2024-06-13 NOTE — Telephone Encounter (Signed)
 Called patient due to not showing up for her 11 am app with Lacie Burton NP. Patient stated she had the date down for next Tuesday. Will send a message to scheduling to contact patient and reschedule.

## 2024-06-13 NOTE — Progress Notes (Deleted)
 CLINIC:  Survivorship   REASON FOR VISIT:  Routine follow-up post-treatment for a recent history of breast cancer.  BRIEF ONCOLOGIC HISTORY:  Oncology History  Malignant neoplasm of upper-inner quadrant of right breast in female, estrogen receptor positive (HCC)  09/21/2018 Initial Diagnosis   Screening mammogram detected right breast mass 2:30 position.  3 cm from nipple 0.9 x 0.6 x 0.8 cm, ultrasound axilla negative biopsy revealed grade 1-2 IDC ER 95%, PR 90%, HER-2 1+ negative, Ki-67 5%, T1b N0 stage Ia clinical stage   10/10/2018 Cancer Staging   Staging form: Breast, AJCC 8th Edition - Clinical: Stage IA (cT1b, cN0, cM0, G2, ER+, PR+, HER2-) - Signed by Odean Potts, MD on 10/10/2018   10/28/2018 Genetic Testing   Genetic testing showed no pathogenic mutations. 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  Genes tested include: AIP, ALK, APC, ATM, AXIN2,BAP1,  BARD1, BLM, BMPR1A, BRCA1, BRCA2, BRIP1, CASR, CDC73, CDH1, CDK4, CDKN1B, CDKN1C, CDKN2A (p14ARF), CDKN2A (p16INK4a), CEBPA, CHEK2, CTNNA1, DICER1, DIS3L2, EGFR (c.2369C>T, p.Thr790Met variant only), EPCAM (Deletion/duplication testing only), FH, FLCN, GATA2, GPC3, GREM1 (Promoter region deletion/duplication testing only), HOXB13 (c.251G>A, p.Gly84Glu), HRAS, KIT, MAX, MEN1, MET, MITF (c.952G>A, p.Glu318Lys variant only), MLH1, MSH2, MSH3, MSH6, MUTYH, NBN, NF1, NF2, NTHL1, PALB2, PDGFRA, PHOX2B, PMS2, POLD1, POLE, POT1, PRKAR1A, PTCH1, PTEN, RAD50, RAD51C, RAD51D, RB1, RECQL4, RET, RUNX1, SDHAF2, SDHA (sequence changes only), SDHB, SDHC, SDHD, SMAD4, SMARCA4, SMARCB1, SMARCE1, STK11, SUFU, TERC, TERT, TMEM127, TP53, TSC1, TSC2, VHL, WRN and WT1.      01/06/2019 Surgery   Right lumpectomy: IDC, grade 1, 0.8 cm, margins negative, negative for LV I or PNI, ER 95%, PR 90%, HER-2 negative, Ki-67 5% T1BN0 stage Ia   01/06/2019 Cancer Staging   Staging form: Breast, AJCC 8th Edition - Pathologic stage  from 01/06/2019: Stage IA (pT1b, pN0, cM0, G1, ER+, PR+, HER2-) - Signed by Crawford Morna Pickle, NP on 01/25/2019   02/2019 -  Anti-estrogen oral therapy   Anastrozole daily   Ductal carcinoma in situ (DCIS) of right breast  12/13/2023 Initial Diagnosis   Ductal carcinoma in situ (DCIS) of right breast   12/24/2023 Cancer Staging   Staging form: Breast, AJCC 8th Edition - Pathologic stage from 12/24/2023: Stage 0 (pTis (DCIS), pN0, cM0, ER+, PR-, HER2: Not Assessed) - Signed by Wyatt Leeroy HERO, PA-C on 12/24/2023 Nuclear grade: G3     INTERVAL HISTORY:  Ms. Benn presents to the Survivorship Clinic today for our initial meeting to review her survivorship care plan detailing her treatment course for breast cancer, as well as monitoring long-term side effects of that treatment, education regarding health maintenance, screening, and overall wellness and health promotion.     Overall, Ms. Rawe reports feeling quite well since completing her radiation therapy approximately 3 months ago.  She ***    REVIEW OF SYSTEMS:  Review of Systems - Oncology Breast: Denies any new nodularity, masses, tenderness, nipple changes, or nipple discharge.      ONCOLOGY TREATMENT TEAM:  1. Surgeon:  Dr. Aron at [facility] 2. Medical Oncologist: Dr. Gudena  3. Radiation Oncologist: Dr. Izell    PAST MEDICAL/SURGICAL HISTORY:  Past Medical History:  Diagnosis Date   Allergy    Arthritis    Cancer (HCC) 10/2018   breast right, receptor positive   Chronic kidney disease    Stage 2-3   Colon polyps    Complication of anesthesia    Hard to wake up  Diabetic retinopathy (HCC)    Type II   Difficulty in urination    Diverticulitis    Family history of breast cancer    Family history of lung cancer    Family history of prostate cancer    Hyperlipemia    Hypertension    Obesity    TIA (transient ischemic attack)    Past Surgical History:  Procedure Laterality Date   BREAST  BIOPSY Right 11/29/2023   MM RT BREAST BX W LOC DEV 1ST LESION IMAGE BX SPEC STEREO GUIDE 11/29/2023 [facility]   BREAST BIOPSY  12/21/2023   MM RT RADIOACTIVE SEED LOC MAMMO GUIDE 12/21/2023 [facility]   BREAST EXCISIONAL BIOPSY Right    about 30 years ago.    BREAST LUMPECTOMY Right 01/2019   BREAST LUMPECTOMY WITH RADIOACTIVE SEED LOCALIZATION Right 01/06/2019   Procedure: RIGHT BREAST LUMPECTOMY WITH RADIOACTIVE SEED LOCALIZATION;  Surgeon: Gail Favorite, MD;  Location: [facility] OR;  Service: General;  Laterality: Right;   BREAST LUMPECTOMY WITH RADIOACTIVE SEED LOCALIZATION Right 12/22/2023   Procedure: RIGHT BREAST SEED LOCALIZED LUMPECTOMY;  Surgeon: Aron Shoulders, MD;  Location: [facility];  Service: General;  Laterality: Right;   BREAST SURGERY     breast biospy   calcium removal from breast     COLONOSCOPY     right breast biopsy Right 09/21/2018   urinary sling       ALLERGIES:  Allergies  Allergen Reactions   Aspirin     Other reaction(s): stomach upset, also  messes with my kidneys   Other     Mycin drugs not sure which one      CURRENT MEDICATIONS:  Outpatient Encounter Medications as of 06/13/2024  Medication Sig   acetaminophen  (TYLENOL ) 650 MG CR tablet Take 650 mg by mouth every 8 (eight) hours as needed for pain.   amLODipine (NORVASC) 10 MG tablet Take 1 tablet by mouth daily.   Ascorbic Acid (VITAMIN C) 500 MG CHEW 1 tablet   atorvastatin (LIPITOR) 40 MG tablet Take 1 tablet by mouth every evening.   Besifloxacin HCl (BESIVANCE) 0.6 % SUSP Place 1 drop into the right eye See admin instructions. Drop 1 drop in the right eye the day of and the day after eye injection. (Patient not taking: Reported on 12/23/2023)   BLACK CURRANT SEED OIL PO Take by mouth.   calcium carbonate (OS-CAL) 600 MG TABS Take 600 mg by mouth 2 (two) times daily with a meal.   cetirizine (ZYRTEC ALLERGY) 10 MG tablet 1 tablet   cholecalciferol (VITAMIN D) 1000  UNITS tablet Take 1,000 Units by mouth 2 (two) times daily.    clobetasol cream (TEMOVATE) 0.05 % Apply 1 application  topically daily as needed (irritation).   clopidogrel (PLAVIX) 75 MG tablet Take 1 tablet by mouth daily.   Elderberry-Vitamin C-Zinc (ELDERBERRY EXTRACT PO) Take by mouth.   exemestane  (AROMASIN ) 25 MG tablet Take 1 tablet (25 mg total) by mouth daily.   JARDIANCE 10 MG TABS tablet Take 10 mg by mouth daily.   metFORMIN  (GLUCOPHAGE -XR) 500 MG 24 hr tablet Take 1 tablet (500 mg total) by mouth daily with breakfast. (Patient not taking: Reported on 04/05/2024)   Multiple Vitamin (MULTIVITAMIN) tablet Take 0.5 tablets by mouth 2 (two) times daily.    Polyethyl Glycol-Propyl Glycol (SYSTANE OP) Apply 1 drop to eye every morning.   traMADol  (ULTRAM ) 50 MG tablet Take 1 tablet (50 mg total) by mouth every 6 (six) hours as needed. (  Patient not taking: Reported on 04/05/2024)   Vibegron  (GEMTESA ) 75 MG TABS Take 1 tablet by mouth daily.   No facility-administered encounter medications on file as of 06/13/2024.     ONCOLOGIC FAMILY HISTORY:  Family History  Problem Relation Age of Onset   Breast cancer Mother        65s   Diabetes Mother    Heart disease Mother    Cancer Mother    Hyperlipidemia Mother    Hypertension Mother    Stroke Mother    Prostate cancer Father        70s   Diabetes Father    Heart disease Father    Hyperlipidemia Father    Hypertension Father    Breast cancer Brother 42   Prostate cancer Brother        83s   Colon polyps Brother    Breast cancer Maternal Aunt        63s   Prostate cancer Maternal Uncle    Lung cancer Paternal Grandmother    Breast cancer Maternal Aunt        50s   Breast cancer Cousin    Breast cancer Cousin    Breast cancer Cousin    Breast cancer Cousin    Breast cancer Cousin      GENETIC COUNSELING/TESTING: Yes, negative  SOCIAL HISTORY:  TANYLA STEGE is /single/married/divorced/widowed/separated and lives  alone/with her spouse/family/friend in (city), [place] .  She has (#) children and they live in (city).  Ms. Nachtigal is currently retired/disabled/working part-time/full-time as ***.  She denies any current or history of tobacco, alcohol, or illicit drug use.     PHYSICAL EXAMINATION:  Vital Signs:  There were no vitals filed for this visit. There were no vitals filed for this visit. General: Well-nourished, well-appearing female in no acute distress.  She is unaccompanied/accompanied in clinic by her ***** today.   HEENT: Head is normocephalic.  Pupils equal and reactive to light. Conjunctivae clear without exudate.  Sclerae anicteric. Oral mucosa is pink, moist.  Oropharynx is pink without lesions or erythema.  Lymph: No cervical, supraclavicular, or infraclavicular lymphadenopathy noted on palpation.  Cardiovascular: Regular rate and rhythm.SABRA Respiratory: Clear to auscultation bilaterally. Chest expansion symmetric; breathing non-labored.  GI: Abdomen soft and round; non-tender, non-distended. Bowel sounds normoactive.  GU: Deferred.  Neuro: No focal deficits. Steady gait.  Psych: Mood and affect normal and appropriate for situation.  Extremities: No edema. MSK: No focal spinal tenderness to palpation.  Full range of motion in bilateral upper extremities Skin: Warm and dry.  LABORATORY DATA:  None for this visit.  DIAGNOSTIC IMAGING:  None for this visit.      ASSESSMENT AND PLAN:  Ms.. Criscuolo is a pleasant 83 y.o. female with Stage IA right breast cancer 2020 with right breast recurrence (DCIS) ER+/PR-, diagnosed in 12/2023, treated with lumpectomy, adjuvant radiation therapy, and anti-estrogen therapy with Exemestane  beginning in 02/2024.  She presents to the Survivorship Clinic for our initial meeting and routine follow-up post-completion of treatment for breast cancer.    1. Right breast cancer:  Ms. Wallman is continuing to recover from definitive treatment for breast cancer. She  will follow-up with her medical oncologist, Dr. Odean in (month) /2017 with history and physical exam per surveillance protocol.  She will continue her anti-estrogen therapy with (drug). Thus far, she is tolerating the *** well, with minimal side effects. She was instructed to make Dr. Gudena or myself aware if she begins to experience  any worsening side effects of the medication and I could see her back in clinic to help manage those side effects, as needed. Though the incidence is low, there is an associated risk of endometrial cancer with anti-estrogen therapies like Tamoxifen.  Ms. Nodal was encouraged to contact Dr. Odean or myself with any vaginal bleeding while taking Tamoxifen. Other side effects of Tamoxifen were again reviewed with her as well. Today, a comprehensive survivorship care plan and treatment summary was reviewed with the patient today detailing her breast cancer diagnosis, treatment course, potential late/long-term effects of treatment, appropriate follow-up care with recommendations for the future, and patient education resources.  A copy of this summary, along with a letter will be sent to the patient's primary care provider via mail/fax/In Basket message after today's visit.    #. Problem(s) at Visit______________  #. Bone health:  Given Ms. Abdella's age/history of breast cancer and her current treatment regimen including anti-estrogen therapy with _______, she is at risk for bone demineralization.  Her last DEXA scan was **/**/20**, which showed (results).***  In the meantime, she was encouraged to increase her consumption of foods rich in calcium, as well as increase her weight-bearing activities.  She was given education on specific activities to promote bone health.  #. Cancer screening:  Due to Ms. Illescas's history and her age, she should receive screening for skin cancers, colon cancer, and gynecologic cancers.  The information and recommendations are listed on the patient's  comprehensive care plan/treatment summary and were reviewed in detail with the patient.    #. Health maintenance and wellness promotion: Ms. Blasing was encouraged to consume 5-7 servings of fruits and vegetables per day. She was also encouraged to engage in moderate to vigorous exercise for 30 minutes per day most days of the week. She was instructed to limit her alcohol consumption and continue to abstain from tobacco use/***was encouraged stop smoking.     #. Support services/counseling: It is not uncommon for this period of the patient's cancer care trajectory to be one of many emotions and stressors.  We discussed an opportunity for her to participate in the next session of FYNN (Finding Your New Normal) support group series designed for patients after they have completed treatment.   Ms. Kemp was encouraged to take advantage of our many other support services programs, support groups, and/or counseling in coping with her new life as a cancer survivor after completing anti-cancer treatment.  She was offered support today through active listening and expressive supportive counseling.  She was given information regarding our available services and encouraged to contact me with any questions or for help enrolling in any of our support group/programs.    Dispo:   -Return to cancer center ***  -Mammogram due in *** -Follow up with surgery *** -She is welcome to return back to the Survivorship Clinic at any time; no additional follow-up needed at this time.  -Consider referral back to survivorship as a long-term survivor for continued surveillance  A total of (30) minutes of face-to-face time was spent with this patient with greater than 50% of that time in counseling and care-coordination.   Kaysie Michelini, NP Survivorship Program [facility] [phone]   Note: PRIMARY CARE PROVIDER Rexanne Ingle, MD [phone]

## 2024-06-21 ENCOUNTER — Ambulatory Visit: Admitting: Podiatry

## 2024-07-04 ENCOUNTER — Inpatient Hospital Stay: Attending: Nurse Practitioner | Admitting: Nurse Practitioner

## 2024-07-04 ENCOUNTER — Encounter: Payer: Self-pay | Admitting: Nurse Practitioner

## 2024-07-04 VITALS — BP 147/70 | HR 78 | Temp 97.3°F | Resp 18 | Wt 190.4 lb

## 2024-07-04 DIAGNOSIS — Z803 Family history of malignant neoplasm of breast: Secondary | ICD-10-CM | POA: Diagnosis not present

## 2024-07-04 DIAGNOSIS — Z801 Family history of malignant neoplasm of trachea, bronchus and lung: Secondary | ICD-10-CM | POA: Diagnosis not present

## 2024-07-04 DIAGNOSIS — Z170 Estrogen receptor positive status [ER+]: Secondary | ICD-10-CM | POA: Diagnosis not present

## 2024-07-04 DIAGNOSIS — C50211 Malignant neoplasm of upper-inner quadrant of right female breast: Secondary | ICD-10-CM | POA: Diagnosis present

## 2024-07-04 DIAGNOSIS — Z79811 Long term (current) use of aromatase inhibitors: Secondary | ICD-10-CM | POA: Diagnosis not present

## 2024-07-04 DIAGNOSIS — D0511 Intraductal carcinoma in situ of right breast: Secondary | ICD-10-CM

## 2024-07-04 NOTE — Progress Notes (Signed)
 CLINIC:  Survivorship   REASON FOR VISIT:  Routine follow-up post-treatment for a recent history of breast cancer.  BRIEF ONCOLOGIC HISTORY:  Oncology History  Malignant neoplasm of upper-inner quadrant of right breast in female, estrogen receptor positive (HCC)  09/21/2018 Initial Diagnosis   Screening mammogram detected right breast mass 2:30 position.  3 cm from nipple 0.9 x 0.6 x 0.8 cm, ultrasound axilla negative biopsy revealed grade 1-2 IDC ER 95%, PR 90%, HER-2 1+ negative, Ki-67 5%, T1b N0 stage Ia clinical stage   10/10/2018 Cancer Staging   Staging form: Breast, AJCC 8th Edition - Clinical: Stage IA (cT1b, cN0, cM0, G2, ER+, PR+, HER2-) - Signed by Odean Potts, MD on 10/10/2018   10/28/2018 Genetic Testing   Genetic testing showed no pathogenic mutations. 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  Genes tested include: AIP, ALK, APC, ATM, AXIN2,BAP1,  BARD1, BLM, BMPR1A, BRCA1, BRCA2, BRIP1, CASR, CDC73, CDH1, CDK4, CDKN1B, CDKN1C, CDKN2A (p14ARF), CDKN2A (p16INK4a), CEBPA, CHEK2, CTNNA1, DICER1, DIS3L2, EGFR (c.2369C>T, p.Thr790Met variant only), EPCAM (Deletion/duplication testing only), FH, FLCN, GATA2, GPC3, GREM1 (Promoter region deletion/duplication testing only), HOXB13 (c.251G>A, p.Gly84Glu), HRAS, KIT, MAX, MEN1, MET, MITF (c.952G>A, p.Glu318Lys variant only), MLH1, MSH2, MSH3, MSH6, MUTYH, NBN, NF1, NF2, NTHL1, PALB2, PDGFRA, PHOX2B, PMS2, POLD1, POLE, POT1, PRKAR1A, PTCH1, PTEN, RAD50, RAD51C, RAD51D, RB1, RECQL4, RET, RUNX1, SDHAF2, SDHA (sequence changes only), SDHB, SDHC, SDHD, SMAD4, SMARCA4, SMARCB1, SMARCE1, STK11, SUFU, TERC, TERT, TMEM127, TP53, TSC1, TSC2, VHL, WRN and WT1.      01/06/2019 Surgery   Right lumpectomy: IDC, grade 1, 0.8 cm, margins negative, negative for LV I or PNI, ER 95%, PR 90%, HER-2 negative, Ki-67 5% T1BN0 stage Ia   01/06/2019 Cancer Staging   Staging form: Breast, AJCC 8th Edition - Pathologic stage  from 01/06/2019: Stage IA (pT1b, pN0, cM0, G1, ER+, PR+, HER2-) - Signed by Crawford Morna Pickle, NP on 01/25/2019   02/2019 -  Anti-estrogen oral therapy   Anastrozole  daily   Ductal carcinoma in situ (DCIS) of right breast  12/13/2023 Initial Diagnosis   Ductal carcinoma in situ (DCIS) of right breast   12/24/2023 Cancer Staging   Staging form: Breast, AJCC 8th Edition - Pathologic stage from 12/24/2023: Stage 0 (pTis (DCIS), pN0, cM0, ER+, PR-, HER2: Not Assessed) - Signed by Wyatt Leeroy HERO, PA-C on 12/24/2023 Nuclear grade: G3     INTERVAL HISTORY:  Carolyn Weber presents to the Survivorship Clinic today for our initial meeting to review her survivorship care plan detailing her treatment course for breast cancer, as well as monitoring long-term side effects of that treatment, education regarding health maintenance, screening, and overall wellness and health promotion.     Overall, Carolyn Weber reports feeling well overall since completing her radiation. Skin color coming back to normal. Taking Exemestane , experiences hair thinning and fatigue which she doesn't like but is willing to tolerate. Has chronic back pain, no new/worsening joint pain. Periodically has BM that she doesn't feel coming.     REVIEW OF SYSTEMS:  Review of Systems - Oncology Breast: Denies any new nodularity, masses, tenderness, nipple changes, or nipple discharge.      ONCOLOGY TREATMENT TEAM:  1. Surgeon:  Dr. Aron at [facility] 2. Medical Oncologist: Dr. Gudena  3. Radiation Oncologist: Dr. Izell    PAST MEDICAL/SURGICAL HISTORY:  Past Medical History:  Diagnosis Date   Allergy    Arthritis    Cancer (HCC) 10/2018   breast  right, receptor positive   Chronic kidney disease    Stage 2-3   Colon polyps    Complication of anesthesia    Hard to wake up   Diabetic retinopathy (HCC)    Type II   Difficulty in urination    Diverticulitis    Family history of breast cancer    Family history of  lung cancer    Family history of prostate cancer    Hyperlipemia    Hypertension    Obesity    TIA (transient ischemic attack)    Past Surgical History:  Procedure Laterality Date   BREAST BIOPSY Right 11/29/2023   MM RT BREAST BX W LOC DEV 1ST LESION IMAGE BX SPEC STEREO GUIDE 11/29/2023 [facility]   BREAST BIOPSY  12/21/2023   MM RT RADIOACTIVE SEED LOC MAMMO GUIDE 12/21/2023 [facility]   BREAST EXCISIONAL BIOPSY Right    about 30 years ago.    BREAST LUMPECTOMY Right 01/2019   BREAST LUMPECTOMY WITH RADIOACTIVE SEED LOCALIZATION Right 01/06/2019   Procedure: RIGHT BREAST LUMPECTOMY WITH RADIOACTIVE SEED LOCALIZATION;  Surgeon: Gail Favorite, MD;  Location: [facility];  Service: General;  Laterality: Right;   BREAST LUMPECTOMY WITH RADIOACTIVE SEED LOCALIZATION Right 12/22/2023   Procedure: RIGHT BREAST SEED LOCALIZED LUMPECTOMY;  Surgeon: Aron Shoulders, MD;  Location: [facility];  Service: General;  Laterality: Right;   BREAST SURGERY     breast biospy   calcium removal from breast     COLONOSCOPY     right breast biopsy Right 09/21/2018   urinary sling       ALLERGIES:  Allergies  Allergen Reactions   Aspirin     Other reaction(s): stomach upset, also  messes with my kidneys   Other     Mycin drugs not sure which one      CURRENT MEDICATIONS:  Outpatient Encounter Medications as of 07/04/2024  Medication Sig   acetaminophen  (TYLENOL ) 650 MG CR tablet Take 650 mg by mouth every 8 (eight) hours as needed for pain.   amLODipine (NORVASC) 10 MG tablet Take 1 tablet by mouth daily.   Ascorbic Acid (VITAMIN C) 500 MG CHEW 1 tablet   atorvastatin (LIPITOR) 40 MG tablet Take 1 tablet by mouth every evening.   Besifloxacin HCl (BESIVANCE) 0.6 % SUSP Place 1 drop into the right eye See admin instructions. Drop 1 drop in the right eye the day of and the day after eye injection. (Patient not taking: Reported on 12/23/2023)   BLACK CURRANT SEED OIL PO  Take by mouth.   calcium carbonate (OS-CAL) 600 MG TABS Take 600 mg by mouth 2 (two) times daily with a meal.   cetirizine (ZYRTEC ALLERGY) 10 MG tablet 1 tablet   cholecalciferol (VITAMIN D) 1000 UNITS tablet Take 1,000 Units by mouth 2 (two) times daily.    clobetasol cream (TEMOVATE) 0.05 % Apply 1 application  topically daily as needed (irritation).   clopidogrel (PLAVIX) 75 MG tablet Take 1 tablet by mouth daily.   Elderberry-Vitamin C-Zinc (ELDERBERRY EXTRACT PO) Take by mouth.   exemestane  (AROMASIN ) 25 MG tablet Take 1 tablet (25 mg total) by mouth daily.   JARDIANCE 10 MG TABS tablet Take 10 mg by mouth daily.   metFORMIN  (GLUCOPHAGE -XR) 500 MG 24 hr tablet Take 1 tablet (500 mg total) by mouth daily with breakfast. (Patient not taking: Reported on 04/05/2024)   Multiple Vitamin (MULTIVITAMIN) tablet Take 0.5 tablets by mouth 2 (two) times daily.    Polyethyl  Glycol-Propyl Glycol (SYSTANE OP) Apply 1 drop to eye every morning.   traMADol  (ULTRAM ) 50 MG tablet Take 1 tablet (50 mg total) by mouth every 6 (six) hours as needed. (Patient not taking: Reported on 04/05/2024)   Vibegron  (GEMTESA ) 75 MG TABS Take 1 tablet by mouth daily.   No facility-administered encounter medications on file as of 07/04/2024.     ONCOLOGIC FAMILY HISTORY:  Family History  Problem Relation Age of Onset   Breast cancer Mother        30s   Diabetes Mother    Heart disease Mother    Cancer Mother    Hyperlipidemia Mother    Hypertension Mother    Stroke Mother    Prostate cancer Father        12s   Diabetes Father    Heart disease Father    Hyperlipidemia Father    Hypertension Father    Breast cancer Brother 16   Prostate cancer Brother        11s   Colon polyps Brother    Breast cancer Maternal Aunt        39s   Prostate cancer Maternal Uncle    Lung cancer Paternal Grandmother    Breast cancer Maternal Aunt        38s   Breast cancer Cousin    Breast cancer Cousin    Breast cancer Cousin     Breast cancer Cousin    Breast cancer Cousin      GENETIC COUNSELING/TESTING: Yes, negative  SOCIAL HISTORY:  Carolyn Weber denies any current or history of tobacco, alcohol, or illicit drug use.     PHYSICAL EXAMINATION:  Vital Signs:   Vitals:   07/04/24 1143  BP: (!) 147/70  Pulse: 78  Resp: 18  Temp: (!) 97.3 F (36.3 C)  SpO2: 98%   Filed Weights   07/04/24 1143  Weight: 190 lb 7 oz (86.4 kg)   General: Well-nourished, well-appearing female in no acute distress.   HEENT:   Sclerae anicteric.  Respiratory: breathing non-labored.   Neuro: No focal deficits. Steady gait.  Psych: Mood and affect normal and appropriate for situation.  Extremities: No edema. Breast: pt refused   LABORATORY DATA:  None for this visit.  DIAGNOSTIC IMAGING:  None for this visit.      ASSESSMENT AND PLAN:  Ms.. Weber is a pleasant 83 y.o. female with Stage IA right breast cancer 2020 with right breast recurrence (DCIS) ER+/PR-, diagnosed in 12/2023, treated with lumpectomy, adjuvant radiation therapy, and anti-estrogen therapy with Exemestane  beginning in 02/2024.  She presents to the Survivorship Clinic for our initial meeting and routine follow-up post-completion of treatment for breast cancer.    #. Right breast cancer:  Carolyn Weber is continuing to recover from definitive treatment for breast cancer. She will follow-up with her medical oncologist, Dr. Gudena in 11/2024 with history and physical exam per surveillance protocol.  She will continue her anti-estrogen therapy with Exemestane . Thus far, she is tolerating mostly well except hair thinning and fatigue. She is willing to conitnue for now. She was instructed to make Dr. Gudena or myself aware if she begins to experience any worsening side effects of the medication and I could see her back in clinic to help manage those side effects, as needed. I offered her a breast exam today so she could postpone her surgery f/up, but she declined  and preferred to keep scheduled visits as is.  Today, a comprehensive survivorship care  plan and treatment summary was reviewed with the patient today detailing her breast cancer diagnosis, treatment course, potential late/long-term effects of treatment, appropriate follow-up care with recommendations for the future, and patient education resources.  A copy of this summary, along with a letter will be sent to the patient's primary care provider via mail/fax/In Basket message after today's visit.    #. Bone health:  Given Carolyn Weber's age/history of breast cancer and her current treatment regimen including anti-estrogen therapy with exemestane , she is at risk for bone demineralization.  Her last DEXA scan was 09/02/22, which was normal. Encouraged to continue calcium/vitamin D which she is taking once daily. Continue weight-bearing activities as tolerated.  She was given education on specific activities to promote bone health.  #. Cancer screening:  Due to Carolyn Weber's history and her age, she should receive screening for skin cancers. She likely has aged out of gyn and colon cancers but should be discussed openly with PCP/GI/GYN.  The information and recommendations are listed on the patient's comprehensive care plan/treatment summary and were reviewed in detail with the patient.    #. Health maintenance and wellness promotion: Carolyn Weber was encouraged to consume 5-7 servings of fruits and vegetables per day. She was also encouraged to engage in moderate to vigorous exercise for 30 minutes per day most days of the week. She does [employer]. She was instructed to limit her alcohol consumption and continue to abstain from tobacco use.    #. Support services/counseling: It is not uncommon for this period of the patient's cancer care trajectory to be one of many emotions and stressors.  We discussed an opportunity for her to participate in the next session of FYNN (Finding Your New Normal) support group series  designed for patients after they have completed treatment.   Carolyn Weber was encouraged to take advantage of our many other support services programs, support groups, and/or counseling in coping with her new life as a cancer survivor after completing anti-cancer treatment.  She was offered support today through active listening and expressive supportive counseling.  She was given information regarding our available services and encouraged to contact me with any questions or for help enrolling in any of our support group/programs.    Dispo:   -Prefers to keep surgery f/up as is 9/8, Dr. Aron notified  -Return to cancer center 11/2024 as scheduled  -Mammogram due in 11/2024 -Provided her with a copy of dental clearance letter -Continue exemestane    She is welcome to return back to the Survivorship Clinic at any time; no additional follow-up needed at this time. Consider referral back to survivorship as a long-term survivor for continued surveillance   Orders Placed This Encounter  Procedures   MM DIAG BREAST TOMO BILATERAL    Standing Status:   Future    Expected Date:   11/10/2024    Expiration Date:   07/04/2025    Reason for Exam (SYMPTOM  OR DIAGNOSIS REQUIRED):   recurrent R breast cancer 2020, 2025    Preferred imaging location?:   [facility]    A total of (30) minutes of face-to-face time was spent with this patient with greater than 50% of that time in counseling and care-coordination.   Trevan Messman, NP Survivorship Program [facility] [phone]   Note: PRIMARY CARE PROVIDER Rexanne Ingle, MD [phone]

## 2024-08-09 ENCOUNTER — Encounter: Payer: Self-pay | Admitting: Podiatry

## 2024-08-09 ENCOUNTER — Ambulatory Visit (INDEPENDENT_AMBULATORY_CARE_PROVIDER_SITE_OTHER): Payer: 59 | Admitting: Podiatry

## 2024-08-09 DIAGNOSIS — M79675 Pain in left toe(s): Secondary | ICD-10-CM | POA: Diagnosis not present

## 2024-08-09 DIAGNOSIS — E1142 Type 2 diabetes mellitus with diabetic polyneuropathy: Secondary | ICD-10-CM

## 2024-08-09 DIAGNOSIS — B351 Tinea unguium: Secondary | ICD-10-CM

## 2024-08-09 DIAGNOSIS — M79674 Pain in right toe(s): Secondary | ICD-10-CM | POA: Diagnosis not present

## 2024-08-14 ENCOUNTER — Encounter: Payer: Self-pay | Admitting: Podiatry

## 2024-08-14 NOTE — Progress Notes (Signed)
  Subjective:  Patient ID: Carolyn Weber, female    DOB: Nov 28, 1940,  MRN: [id number]  Carolyn Weber presents to clinic today for at risk foot care with history of diabetic neuropathy and painful mycotic toenails of both feet that are difficult to trim. Pain interferes with daily activities and wearing enclosed shoe gear comfortably. Patient states she canceled her last visit due to weather. She does not drive in the rain or snow. Chief Complaint  Patient presents with   Diabetes    DFC NIDDM A1C 6.9. Toenail trim. LOV with PCP 02/2024   New problem(s): None.   PCP is Rexanne Ingle, MD.  Allergies  Allergen Reactions   Aspirin     Other reaction(s): stomach upset, also  messes with my kidneys   Other     Mycin drugs not sure which one     Review of Systems: Negative except as noted in the HPI.  Objective: No changes noted in today's physical examination. There were no vitals filed for this visit. Carolyn Weber is a pleasant 83 y.o. female in NAD. AAO x 3.  Vascular Examination: Capillary refill time immediate b/l. Vascular status intact b/l with palpable pedal pulses. Pedal hair present b/l. No pain with calf compression b/l. Skin temperature gradient WNL b/l. No cyanosis or clubbing b/l. No ischemia or gangrene noted b/l.   Neurological Examination: Sensation grossly intact b/l with 10 gram monofilament.  Pt has subjective symptoms of neuropathy.  Dermatological Examination: Pedal skin with normal turgor, texture and tone b/l.  No open wounds. No interdigital macerations.   Toenails 1-5 b/l thick, discolored, elongated with subungual debris and pain on dorsal palpation.   No hyperkeratotic nor porokeratotic lesions present on today's visit.  Musculoskeletal Examination: Muscle strength 5/5 to all lower extremity muscle groups bilaterally. HAV with bunion deformity noted b/l LE. Hammertoe deformity noted 2-5 b/l.  Radiographs: None  Assessment/Plan: 1. Pain due  to onychomycosis of toenails of both feet   2. Diabetic peripheral neuropathy associated with type 2 diabetes mellitus [facility])     Consent given for treatment. Patient examined. All patient's and/or POA's questions/concerns addressed on today's visit. Toenails 1-5 debrided in length and girth without incident. Continue foot and shoe inspections daily. Monitor blood glucose per PCP/Endocrinologist's recommendations. Continue soft, supportive shoe gear daily. Report any pedal injuries to medical professional. Call office if there are any questions/concerns. -Patient/POA to call should there be question/concern in the interim.   Return in about 9 weeks (around 10/11/2024).  Delon LITTIE Merlin, DPM      [place] LOCATION: [address], [place] [address]                   Office [phone]   [facility] LOCATION: [address] [place], [place] [address] Office [phone]

## 2024-09-04 ENCOUNTER — Other Ambulatory Visit (HOSPITAL_BASED_OUTPATIENT_CLINIC_OR_DEPARTMENT_OTHER)

## 2024-09-04 ENCOUNTER — Other Ambulatory Visit: Payer: 59

## 2024-09-04 ENCOUNTER — Ambulatory Visit (HOSPITAL_BASED_OUTPATIENT_CLINIC_OR_DEPARTMENT_OTHER)
Admission: RE | Admit: 2024-09-04 | Discharge: 2024-09-04 | Disposition: A | Discharge status: Home / Self Care | Source: Ambulatory Visit | Attending: Internal Medicine | Admitting: Internal Medicine

## 2024-09-04 DIAGNOSIS — Z13820 Encounter for screening for osteoporosis: Secondary | ICD-10-CM | POA: Diagnosis present

## 2024-09-04 DIAGNOSIS — M8589 Other specified disorders of bone density and structure, multiple sites: Secondary | ICD-10-CM | POA: Insufficient documentation

## 2024-10-04 ENCOUNTER — Ambulatory Visit: Payer: 59 | Admitting: Podiatry

## 2024-10-11 ENCOUNTER — Encounter: Payer: Self-pay | Admitting: Podiatry

## 2024-10-11 ENCOUNTER — Ambulatory Visit: Admitting: Podiatry

## 2024-10-11 DIAGNOSIS — M79674 Pain in right toe(s): Secondary | ICD-10-CM | POA: Diagnosis not present

## 2024-10-11 DIAGNOSIS — B351 Tinea unguium: Secondary | ICD-10-CM | POA: Diagnosis not present

## 2024-10-11 DIAGNOSIS — E1142 Type 2 diabetes mellitus with diabetic polyneuropathy: Secondary | ICD-10-CM | POA: Diagnosis not present

## 2024-10-11 DIAGNOSIS — M79675 Pain in left toe(s): Secondary | ICD-10-CM | POA: Diagnosis not present

## 2024-10-19 ENCOUNTER — Encounter: Payer: Self-pay | Admitting: Podiatry

## 2024-10-19 NOTE — Progress Notes (Signed)
°  Subjective:  Patient ID: Carolyn Weber, female    DOB: [birth year],  MRN: [id number]  Carolyn Weber presents to clinic today for at risk foot care with history of diabetic neuropathy and painful mycotic toenails x 10 which interfere with daily activities. Pain is relieved with periodic professional debridement.  Chief Complaint  Patient presents with   [facility]    Rm15 Diabetic foot care/ Dr. Tanda Bame last visit April 2025/ A1c 6.9   New problem(s): None.   PCP is Bame Tanda, MD.  Allergies[1]  Review of Systems: Negative except as noted in the HPI.  Objective: No changes noted in today's physical examination. There were no vitals filed for this visit. [name] is a pleasant 83 y.o. female in NAD. AAO x 3.  Vascular Examination: Capillary refill time immediate b/l. Vascular status intact b/l with palpable pedal pulses. Pedal hair present b/l. No pain with calf compression b/l. Skin temperature gradient WNL b/l. No cyanosis or clubbing b/l. No ischemia or gangrene noted b/l.   Neurological Examination: Sensation grossly intact b/l with 10 gram monofilament.  Pt has subjective symptoms of neuropathy.  Dermatological Examination: Pedal skin with normal turgor, texture and tone b/l.  No open wounds. No interdigital macerations.   Toenails 1-5 b/l thick, discolored, elongated with subungual debris and pain on dorsal palpation.   No hyperkeratotic nor porokeratotic lesions present on today's visit.  Musculoskeletal Examination: Muscle strength 5/5 to all lower extremity muscle groups bilaterally. HAV with bunion deformity noted b/l LE. Hammertoe deformity noted 2-5 b/l.  Radiographs: None  Assessment/Plan: 1. Pain due to onychomycosis of toenails of both feet   2. Diabetic peripheral neuropathy associated with type 2 diabetes mellitus (HCC)   Patient was evaluated and treated. All patient's and/or POA's questions/concerns addressed on today's visit. Mycotic toenails  1-5 b/l debrided in length and girth without incident.  Continue daily foot inspections and monitor blood glucose per PCP/Endocrinologist's recommendations.Continue soft, supportive shoe gear daily. Report any pedal injuries to medical professional. Call office if there are any quesitons/concerns. -Patient/POA to call should there be question/concern in the interim.   Return in about 3 months (around 01/09/2025).  Delon LITTIE Merlin, DPM      [place] LOCATION: [address], [place] [address]                   Office [phone]   [place] LOCATION: [address] [place], [place] [address] Office [phone]     [1]  Allergies Allergen Reactions   Aspirin     Other reaction(s): stomach upset, also  messes with my kidneys   Other     Mycin drugs not sure which one

## 2024-11-10 ENCOUNTER — Ambulatory Visit
Admission: RE | Admit: 2024-11-10 | Discharge: 2024-11-10 | Disposition: A | Source: Ambulatory Visit | Attending: Nurse Practitioner

## 2024-11-10 DIAGNOSIS — D0511 Intraductal carcinoma in situ of right breast: Secondary | ICD-10-CM

## 2024-11-10 DIAGNOSIS — C50211 Malignant neoplasm of upper-inner quadrant of right female breast: Secondary | ICD-10-CM

## 2024-11-27 ENCOUNTER — Inpatient Hospital Stay: Payer: 59 | Admitting: Hematology and Oncology

## 2024-12-06 ENCOUNTER — Ambulatory Visit: Payer: 59 | Admitting: Podiatry

## 2024-12-13 ENCOUNTER — Ambulatory Visit: Admitting: Podiatry

## 2024-12-21 ENCOUNTER — Inpatient Hospital Stay: Admitting: Hematology and Oncology

## 2024-12-29 ENCOUNTER — Encounter (INDEPENDENT_AMBULATORY_CARE_PROVIDER_SITE_OTHER): Payer: 59 | Admitting: Ophthalmology

## 2025-02-14 ENCOUNTER — Ambulatory Visit: Admitting: Podiatry

## 2025-04-18 ENCOUNTER — Ambulatory Visit: Admitting: Podiatry

## 2025-06-20 ENCOUNTER — Ambulatory Visit: Admitting: Podiatry

## 2025-08-22 ENCOUNTER — Ambulatory Visit: Admitting: Podiatry

## 2025-10-24 ENCOUNTER — Ambulatory Visit: Admitting: Podiatry
